# Patient Record
Sex: Male | Born: 1935 | Race: Black or African American | Hispanic: No | Smoking: Former smoker
Health system: Southern US, Community
[De-identification: ages and names within clinical notes are randomized; demographics above are authoritative.]

## PROBLEM LIST (undated history)

## (undated) DIAGNOSIS — R011 Cardiac murmur, unspecified: Secondary | ICD-10-CM

## (undated) DIAGNOSIS — I10 Essential (primary) hypertension: Secondary | ICD-10-CM

## (undated) DIAGNOSIS — I2510 Atherosclerotic heart disease of native coronary artery without angina pectoris: Secondary | ICD-10-CM

## (undated) DIAGNOSIS — K224 Dyskinesia of esophagus: Secondary | ICD-10-CM

## (undated) DIAGNOSIS — J383 Other diseases of vocal cords: Secondary | ICD-10-CM

## (undated) DIAGNOSIS — K2210 Ulcer of esophagus without bleeding: Secondary | ICD-10-CM

## (undated) DIAGNOSIS — E7800 Pure hypercholesterolemia, unspecified: Secondary | ICD-10-CM

## (undated) DIAGNOSIS — Z8719 Personal history of other diseases of the digestive system: Secondary | ICD-10-CM

## (undated) DIAGNOSIS — I639 Cerebral infarction, unspecified: Secondary | ICD-10-CM

## (undated) DIAGNOSIS — Z8711 Personal history of peptic ulcer disease: Secondary | ICD-10-CM

## (undated) DIAGNOSIS — E119 Type 2 diabetes mellitus without complications: Secondary | ICD-10-CM

## (undated) DIAGNOSIS — K219 Gastro-esophageal reflux disease without esophagitis: Secondary | ICD-10-CM

## (undated) HISTORY — PX: BACK SURGERY: SHX140

## (undated) HISTORY — PX: CATARACT EXTRACTION W/ INTRAOCULAR LENS IMPLANT: SHX1309

## (undated) HISTORY — PX: LUMBAR DISC SURGERY: SHX700

## (undated) HISTORY — PX: LARYNX SURGERY: SHX692

## (undated) HISTORY — PX: LAPAROSCOPIC CHOLECYSTECTOMY: SUR755

## (undated) HISTORY — PX: APPENDECTOMY: SHX54

---

## 1989-04-27 DIAGNOSIS — I639 Cerebral infarction, unspecified: Secondary | ICD-10-CM

## 1989-04-27 HISTORY — DX: Cerebral infarction, unspecified: I63.9

## 2009-10-27 ENCOUNTER — Ambulatory Visit: Payer: Self-pay | Admitting: Cardiology

## 2009-10-27 ENCOUNTER — Encounter (INDEPENDENT_AMBULATORY_CARE_PROVIDER_SITE_OTHER): Payer: Self-pay | Admitting: Internal Medicine

## 2009-11-01 ENCOUNTER — Emergency Department (HOSPITAL_BASED_OUTPATIENT_CLINIC_OR_DEPARTMENT_OTHER): Admission: EM | Admit: 2009-11-01 | Discharge: 2009-11-01 | Payer: Self-pay | Admitting: Emergency Medicine

## 2010-04-03 ENCOUNTER — Inpatient Hospital Stay (HOSPITAL_COMMUNITY): Admission: EM | Admit: 2010-04-03 | Discharge: 2009-10-29 | Payer: Self-pay | Admitting: Emergency Medicine

## 2010-07-13 LAB — BASIC METABOLIC PANEL
BUN: 16 mg/dL (ref 6–23)
CO2: 34 mEq/L — ABNORMAL HIGH (ref 19–32)
Calcium: 9.2 mg/dL (ref 8.4–10.5)
Creatinine, Ser: 1.23 mg/dL (ref 0.4–1.5)
Potassium: 3.6 mEq/L (ref 3.5–5.1)
Sodium: 139 mEq/L (ref 135–145)

## 2010-07-13 LAB — CK TOTAL AND CKMB (NOT AT ARMC): Relative Index: 1.6 (ref 0.0–2.5)

## 2010-07-13 LAB — URINALYSIS, ROUTINE W REFLEX MICROSCOPIC
Ketones, ur: NEGATIVE mg/dL
Leukocytes, UA: NEGATIVE
Protein, ur: NEGATIVE mg/dL
Specific Gravity, Urine: 1.006 (ref 1.005–1.030)
Urobilinogen, UA: 0.2 mg/dL (ref 0.0–1.0)

## 2010-07-13 LAB — CARDIAC PANEL(CRET KIN+CKTOT+MB+TROPI)
CK, MB: 3.3 ng/mL (ref 0.3–4.0)
Relative Index: 1 (ref 0.0–2.5)
Total CK: 273 U/L — ABNORMAL HIGH (ref 7–232)
Troponin I: 0.02 ng/mL (ref 0.00–0.06)

## 2010-07-13 LAB — URINE MICROSCOPIC-ADD ON

## 2010-07-13 LAB — GLUCOSE, CAPILLARY
Glucose-Capillary: 153 mg/dL — ABNORMAL HIGH (ref 70–99)
Glucose-Capillary: 157 mg/dL — ABNORMAL HIGH (ref 70–99)
Glucose-Capillary: 171 mg/dL — ABNORMAL HIGH (ref 70–99)
Glucose-Capillary: 186 mg/dL — ABNORMAL HIGH (ref 70–99)
Glucose-Capillary: 205 mg/dL — ABNORMAL HIGH (ref 70–99)
Glucose-Capillary: 207 mg/dL — ABNORMAL HIGH (ref 70–99)

## 2010-07-13 LAB — POCT CARDIAC MARKERS: Myoglobin, poc: 128 ng/mL (ref 12–200)

## 2010-07-13 LAB — POCT I-STAT, CHEM 8
BUN: 18 mg/dL (ref 6–23)
Glucose, Bld: 112 mg/dL — ABNORMAL HIGH (ref 70–99)
HCT: 48 % (ref 39.0–52.0)
Potassium: 3.9 mEq/L (ref 3.5–5.1)

## 2010-07-13 LAB — TSH: TSH: 1.225 u[IU]/mL (ref 0.350–4.500)

## 2010-07-13 LAB — MAGNESIUM: Magnesium: 2.2 mg/dL (ref 1.5–2.5)

## 2010-07-13 LAB — LIPID PANEL: LDL Cholesterol: 59 mg/dL (ref 0–99)

## 2010-07-13 LAB — TROPONIN I: Troponin I: 0.02 ng/mL (ref 0.00–0.06)

## 2010-12-03 ENCOUNTER — Other Ambulatory Visit (HOSPITAL_COMMUNITY): Payer: Self-pay | Admitting: Gastroenterology

## 2010-12-03 DIAGNOSIS — R142 Eructation: Secondary | ICD-10-CM

## 2010-12-03 DIAGNOSIS — K30 Functional dyspepsia: Secondary | ICD-10-CM

## 2010-12-05 ENCOUNTER — Ambulatory Visit (HOSPITAL_COMMUNITY)
Admission: RE | Admit: 2010-12-05 | Discharge: 2010-12-05 | Disposition: A | Discharge status: Home / Self Care | Payer: Medicare Other | Source: Ambulatory Visit | Attending: Gastroenterology | Admitting: Gastroenterology

## 2010-12-05 DIAGNOSIS — R6889 Other general symptoms and signs: Secondary | ICD-10-CM | POA: Insufficient documentation

## 2010-12-05 DIAGNOSIS — K3189 Other diseases of stomach and duodenum: Secondary | ICD-10-CM | POA: Insufficient documentation

## 2010-12-05 DIAGNOSIS — K449 Diaphragmatic hernia without obstruction or gangrene: Secondary | ICD-10-CM | POA: Insufficient documentation

## 2010-12-05 DIAGNOSIS — K30 Functional dyspepsia: Secondary | ICD-10-CM

## 2010-12-05 DIAGNOSIS — K219 Gastro-esophageal reflux disease without esophagitis: Secondary | ICD-10-CM | POA: Insufficient documentation

## 2010-12-05 DIAGNOSIS — Z9089 Acquired absence of other organs: Secondary | ICD-10-CM | POA: Insufficient documentation

## 2010-12-05 DIAGNOSIS — K224 Dyskinesia of esophagus: Secondary | ICD-10-CM | POA: Insufficient documentation

## 2010-12-05 DIAGNOSIS — R1110 Vomiting, unspecified: Secondary | ICD-10-CM | POA: Insufficient documentation

## 2010-12-05 DIAGNOSIS — R142 Eructation: Secondary | ICD-10-CM

## 2010-12-19 ENCOUNTER — Emergency Department (HOSPITAL_COMMUNITY)
Admission: EM | Admit: 2010-12-19 | Discharge: 2010-12-19 | Disposition: A | Discharge status: Home / Self Care | Payer: Medicare Other | Attending: Emergency Medicine | Admitting: Emergency Medicine

## 2010-12-19 ENCOUNTER — Encounter (HOSPITAL_COMMUNITY)
Admission: RE | Admit: 2010-12-19 | Discharge: 2010-12-19 | Disposition: A | Discharge status: Home / Self Care | Payer: Medicare Other | Source: Ambulatory Visit | Attending: Gastroenterology | Admitting: Gastroenterology

## 2010-12-19 ENCOUNTER — Emergency Department (HOSPITAL_COMMUNITY): Payer: Medicare Other

## 2010-12-19 DIAGNOSIS — K920 Hematemesis: Secondary | ICD-10-CM | POA: Insufficient documentation

## 2010-12-19 DIAGNOSIS — I10 Essential (primary) hypertension: Secondary | ICD-10-CM | POA: Insufficient documentation

## 2010-12-19 DIAGNOSIS — R142 Eructation: Secondary | ICD-10-CM

## 2010-12-19 DIAGNOSIS — R109 Unspecified abdominal pain: Secondary | ICD-10-CM | POA: Insufficient documentation

## 2010-12-19 DIAGNOSIS — Z794 Long term (current) use of insulin: Secondary | ICD-10-CM | POA: Insufficient documentation

## 2010-12-19 DIAGNOSIS — Z9089 Acquired absence of other organs: Secondary | ICD-10-CM | POA: Insufficient documentation

## 2010-12-19 DIAGNOSIS — E119 Type 2 diabetes mellitus without complications: Secondary | ICD-10-CM | POA: Insufficient documentation

## 2010-12-19 DIAGNOSIS — K30 Functional dyspepsia: Secondary | ICD-10-CM

## 2010-12-19 LAB — CBC
HCT: 43.5 % (ref 39.0–52.0)
Hemoglobin: 13.8 g/dL (ref 13.0–17.0)
MCV: 85.5 fL (ref 78.0–100.0)
RDW: 13.6 % (ref 11.5–15.5)
WBC: 6.3 10*3/uL (ref 4.0–10.5)

## 2010-12-19 LAB — DIFFERENTIAL
Basophils Absolute: 0 10*3/uL (ref 0.0–0.1)
Eosinophils Relative: 7 % — ABNORMAL HIGH (ref 0–5)
Lymphocytes Relative: 30 % (ref 12–46)
Lymphs Abs: 1.9 10*3/uL (ref 0.7–4.0)
Neutro Abs: 3.6 10*3/uL (ref 1.7–7.7)

## 2010-12-19 LAB — COMPREHENSIVE METABOLIC PANEL WITH GFR
ALT: 25 U/L (ref 0–53)
AST: 22 U/L (ref 0–37)
Albumin: 3.5 g/dL (ref 3.5–5.2)
Alkaline Phosphatase: 55 U/L (ref 39–117)
BUN: 16 mg/dL (ref 6–23)
CO2: 35 meq/L — ABNORMAL HIGH (ref 19–32)
Calcium: 9.8 mg/dL (ref 8.4–10.5)
Chloride: 95 meq/L — ABNORMAL LOW (ref 96–112)
Creatinine, Ser: 1.19 mg/dL (ref 0.50–1.35)
GFR calc Af Amer: >60 mL/min (ref >60)
GFR calc non Af Amer: 59 mL/min — ABNORMAL LOW (ref >60)
Glucose, Bld: 140 mg/dL — ABNORMAL HIGH (ref 70–99)
Potassium: 3.2 meq/L — ABNORMAL LOW (ref 3.5–5.1)
Sodium: 137 meq/L (ref 135–145)
Total Bilirubin: 0.4 mg/dL (ref 0.3–1.2)
Total Protein: 7.3 g/dL (ref 6.0–8.3)

## 2010-12-19 LAB — GASTRIC OCCULT BLOOD (1-CARD TO LAB): Occult Blood, Gastric: POSITIVE — AB

## 2010-12-19 LAB — LIPASE, BLOOD: Lipase: 23 U/L (ref 11–59)

## 2010-12-30 ENCOUNTER — Other Ambulatory Visit (HOSPITAL_COMMUNITY): Payer: Self-pay | Admitting: Gastroenterology

## 2010-12-30 DIAGNOSIS — T17998A Other foreign object in respiratory tract, part unspecified causing other injury, initial encounter: Secondary | ICD-10-CM

## 2011-01-02 ENCOUNTER — Other Ambulatory Visit (HOSPITAL_COMMUNITY): Payer: Self-pay | Admitting: Gastroenterology

## 2011-01-02 DIAGNOSIS — R142 Eructation: Secondary | ICD-10-CM

## 2011-01-06 ENCOUNTER — Encounter (HOSPITAL_COMMUNITY)
Admission: RE | Admit: 2011-01-06 | Discharge: 2011-01-06 | Disposition: A | Discharge status: Home / Self Care | Payer: Medicare Other | Source: Ambulatory Visit | Attending: Gastroenterology | Admitting: Gastroenterology

## 2011-01-06 DIAGNOSIS — R142 Eructation: Secondary | ICD-10-CM

## 2011-01-06 DIAGNOSIS — K3189 Other diseases of stomach and duodenum: Secondary | ICD-10-CM | POA: Insufficient documentation

## 2011-01-06 MED ORDER — TECHNETIUM TC 99M SULFUR COLLOID
2.0000 | Freq: Once | INTRAVENOUS | Status: AC | PRN
  Administered 2011-01-06: 2 via INTRAVENOUS

## 2011-01-09 ENCOUNTER — Ambulatory Visit (HOSPITAL_COMMUNITY)
Admission: RE | Admit: 2011-01-09 | Discharge: 2011-01-09 | Disposition: A | Discharge status: Home / Self Care | Payer: Medicare Other | Source: Ambulatory Visit | Attending: Gastroenterology | Admitting: Gastroenterology

## 2011-01-09 DIAGNOSIS — R1310 Dysphagia, unspecified: Secondary | ICD-10-CM | POA: Insufficient documentation

## 2011-01-09 DIAGNOSIS — T17998A Other foreign object in respiratory tract, part unspecified causing other injury, initial encounter: Secondary | ICD-10-CM

## 2011-08-25 ENCOUNTER — Inpatient Hospital Stay (HOSPITAL_BASED_OUTPATIENT_CLINIC_OR_DEPARTMENT_OTHER)
Admission: EM | Admit: 2011-08-25 | Discharge: 2011-08-27 | DRG: 379 | Disposition: A | Discharge status: Home / Self Care | Payer: Medicare Other | Attending: Internal Medicine | Admitting: Internal Medicine

## 2011-08-25 ENCOUNTER — Encounter (HOSPITAL_BASED_OUTPATIENT_CLINIC_OR_DEPARTMENT_OTHER): Payer: Self-pay | Admitting: *Deleted

## 2011-08-25 DIAGNOSIS — Z8673 Personal history of transient ischemic attack (TIA), and cerebral infarction without residual deficits: Secondary | ICD-10-CM

## 2011-08-25 DIAGNOSIS — I10 Essential (primary) hypertension: Secondary | ICD-10-CM | POA: Diagnosis present

## 2011-08-25 DIAGNOSIS — I639 Cerebral infarction, unspecified: Secondary | ICD-10-CM | POA: Diagnosis present

## 2011-08-25 DIAGNOSIS — K209 Esophagitis, unspecified without bleeding: Secondary | ICD-10-CM | POA: Diagnosis present

## 2011-08-25 DIAGNOSIS — E119 Type 2 diabetes mellitus without complications: Secondary | ICD-10-CM | POA: Diagnosis present

## 2011-08-25 DIAGNOSIS — I2510 Atherosclerotic heart disease of native coronary artery without angina pectoris: Secondary | ICD-10-CM | POA: Diagnosis present

## 2011-08-25 DIAGNOSIS — K219 Gastro-esophageal reflux disease without esophagitis: Secondary | ICD-10-CM | POA: Diagnosis present

## 2011-08-25 DIAGNOSIS — K920 Hematemesis: Secondary | ICD-10-CM

## 2011-08-25 DIAGNOSIS — Z8719 Personal history of other diseases of the digestive system: Secondary | ICD-10-CM | POA: Diagnosis present

## 2011-08-25 DIAGNOSIS — K922 Gastrointestinal hemorrhage, unspecified: Principal | ICD-10-CM | POA: Diagnosis present

## 2011-08-25 DIAGNOSIS — Z794 Long term (current) use of insulin: Secondary | ICD-10-CM

## 2011-08-25 DIAGNOSIS — Z8711 Personal history of peptic ulcer disease: Secondary | ICD-10-CM | POA: Diagnosis present

## 2011-08-25 HISTORY — DX: Cerebral infarction, unspecified: I63.9

## 2011-08-25 HISTORY — DX: Essential (primary) hypertension: I10

## 2011-08-25 HISTORY — DX: Personal history of peptic ulcer disease: Z87.11

## 2011-08-25 HISTORY — DX: Atherosclerotic heart disease of native coronary artery without angina pectoris: I25.10

## 2011-08-25 HISTORY — DX: Personal history of other diseases of the digestive system: Z87.19

## 2011-08-25 HISTORY — DX: Gastro-esophageal reflux disease without esophagitis: K21.9

## 2011-08-25 LAB — COMPREHENSIVE METABOLIC PANEL
ALT: 23 U/L (ref 0–53)
Albumin: 3.4 g/dL — ABNORMAL LOW (ref 3.5–5.2)
Calcium: 9.2 mg/dL (ref 8.4–10.5)
GFR calc Af Amer: 71 mL/min — ABNORMAL LOW (ref >90)
Glucose, Bld: 147 mg/dL — ABNORMAL HIGH (ref 70–99)
Potassium: 3.3 mEq/L — ABNORMAL LOW (ref 3.5–5.1)
Sodium: 134 mEq/L — ABNORMAL LOW (ref 135–145)
Total Protein: 6.3 g/dL (ref 6.0–8.3)

## 2011-08-25 LAB — CBC
MCH: 27.9 pg (ref 26.0–34.0)
MCHC: 33.5 g/dL (ref 30.0–36.0)
MCV: 83.3 fL (ref 78.0–100.0)
MCV: 85.1 fL (ref 78.0–100.0)
Platelets: 196 10*3/uL (ref 150–400)
Platelets: 188 10*3/uL (ref 150–400)
RBC: 4.44 MIL/uL (ref 4.22–5.81)
RDW: 13.6 % (ref 11.5–15.5)
RDW: 13.8 % (ref 11.5–15.5)
WBC: 9.3 10*3/uL (ref 4.0–10.5)

## 2011-08-25 LAB — DIFFERENTIAL
Basophils Relative: 0 % (ref 0–1)
Eosinophils Absolute: 0.3 10*3/uL (ref 0.0–0.7)
Eosinophils Relative: 4 % (ref 0–5)
Lymphs Abs: 1.5 10*3/uL (ref 0.7–4.0)
Neutrophils Relative %: 64 % (ref 43–77)

## 2011-08-25 LAB — MAGNESIUM: Magnesium: 1.7 mg/dL (ref 1.5–2.5)

## 2011-08-25 LAB — CARDIAC PANEL(CRET KIN+CKTOT+MB+TROPI)
Relative Index: 0.7 (ref 0.0–2.5)
Total CK: 464 U/L — ABNORMAL HIGH (ref 7–232)
Total CK: 417 U/L — ABNORMAL HIGH (ref 7–232)
Troponin I: <0.3 ng/mL (ref <0.30)

## 2011-08-25 MED ORDER — ACETAMINOPHEN 325 MG PO TABS: 650.0000 mg | ORAL_TABLET | Freq: Four times a day (QID) | ORAL | Status: DC | PRN

## 2011-08-25 MED ORDER — SODIUM CHLORIDE 0.9 % IV SOLN
Freq: Once | INTRAVENOUS | Status: AC
  Administered 2011-08-25: 09:00:00 via INTRAVENOUS

## 2011-08-25 MED ORDER — INSULIN GLARGINE 100 UNIT/ML ~~LOC~~ SOLN
5.0000 [IU] | Freq: Every day | SUBCUTANEOUS | Status: DC
  Administered 2011-08-25 – 2011-08-26 (×2): 5 [IU] via SUBCUTANEOUS

## 2011-08-25 MED ORDER — SUCRALFATE 1 G PO TABS
1.0000 g | ORAL_TABLET | Freq: Three times a day (TID) | ORAL | Status: DC
  Administered 2011-08-25 – 2011-08-27 (×8): 1 g via ORAL
  Filled 2011-08-25 (×12): qty 1

## 2011-08-25 MED ORDER — SODIUM CHLORIDE 0.9 % IV SOLN
8.0000 mg/h | INTRAVENOUS | Status: DC
  Administered 2011-08-25 – 2011-08-27 (×4): 8 mg/h via INTRAVENOUS
  Filled 2011-08-25 (×8): qty 80

## 2011-08-25 MED ORDER — ONDANSETRON HCL 4 MG/2ML IJ SOLN: 4.0000 mg | Freq: Four times a day (QID) | INTRAMUSCULAR | Status: DC | PRN

## 2011-08-25 MED ORDER — SODIUM CHLORIDE 0.9 % IV SOLN
Freq: Once | INTRAVENOUS | Status: AC
  Administered 2011-08-25: 12:00:00 via INTRAVENOUS

## 2011-08-25 MED ORDER — PANTOPRAZOLE SODIUM 40 MG IV SOLR
40.0000 mg | Freq: Once | INTRAVENOUS | Status: AC
  Administered 2011-08-25: 40 mg via INTRAVENOUS
  Filled 2011-08-25: qty 40

## 2011-08-25 MED ORDER — INSULIN ASPART 100 UNIT/ML ~~LOC~~ SOLN: 0.0000 [IU] | Freq: Every day | SUBCUTANEOUS | Status: DC

## 2011-08-25 MED ORDER — CARVEDILOL 25 MG PO TABS
25.0000 mg | ORAL_TABLET | Freq: Two times a day (BID) | ORAL | Status: DC
  Administered 2011-08-25 – 2011-08-27 (×5): 25 mg via ORAL
  Filled 2011-08-25 (×6): qty 1

## 2011-08-25 MED ORDER — ONDANSETRON HCL 4 MG PO TABS: 4.0000 mg | ORAL_TABLET | Freq: Four times a day (QID) | ORAL | Status: DC | PRN

## 2011-08-25 MED ORDER — SODIUM CHLORIDE 0.9 % IJ SOLN
3.0000 mL | Freq: Two times a day (BID) | INTRAMUSCULAR | Status: DC
  Administered 2011-08-25: 3 mL via INTRAVENOUS

## 2011-08-25 MED ORDER — ACETAMINOPHEN 650 MG RE SUPP: 650.0000 mg | Freq: Four times a day (QID) | RECTAL | Status: DC | PRN

## 2011-08-25 MED ORDER — ATORVASTATIN CALCIUM 40 MG PO TABS
40.0000 mg | ORAL_TABLET | Freq: Every day | ORAL | Status: DC
  Administered 2011-08-25 – 2011-08-27 (×3): 40 mg via ORAL
  Filled 2011-08-25 (×3): qty 1

## 2011-08-25 MED ORDER — GI COCKTAIL ~~LOC~~
ORAL | Status: AC
  Administered 2011-08-25: 30 mL
  Filled 2011-08-25: qty 30

## 2011-08-25 MED ORDER — INSULIN ASPART 100 UNIT/ML ~~LOC~~ SOLN
0.0000 [IU] | Freq: Three times a day (TID) | SUBCUTANEOUS | Status: DC
  Administered 2011-08-26: 2 [IU] via SUBCUTANEOUS
  Administered 2011-08-27 (×2): 1 [IU] via SUBCUTANEOUS
  Administered 2011-08-27: 2 [IU] via SUBCUTANEOUS

## 2011-08-25 MED ORDER — ONDANSETRON HCL 4 MG/2ML IJ SOLN
4.0000 mg | Freq: Once | INTRAMUSCULAR | Status: AC
  Administered 2011-08-25: 4 mg via INTRAVENOUS
  Filled 2011-08-25: qty 2

## 2011-08-25 MED ORDER — ALUM & MAG HYDROXIDE-SIMETH 200-200-20 MG/5ML PO SUSP: 30.0000 mL | Freq: Four times a day (QID) | ORAL | Status: DC | PRN

## 2011-08-25 MED ORDER — AMLODIPINE BESYLATE 10 MG PO TABS
10.0000 mg | ORAL_TABLET | Freq: Every day | ORAL | Status: DC
  Administered 2011-08-25 – 2011-08-27 (×2): 10 mg via ORAL
  Filled 2011-08-25 (×3): qty 1

## 2011-08-25 MED ORDER — LATANOPROST 0.005 % OP SOLN
1.0000 [drp] | Freq: Every day | OPHTHALMIC | Status: DC
  Administered 2011-08-25 – 2011-08-26 (×2): 1 [drp] via OPHTHALMIC
  Filled 2011-08-25 (×2): qty 2.5

## 2011-08-25 MED ORDER — POTASSIUM CHLORIDE CRYS ER 20 MEQ PO TBCR
40.0000 meq | EXTENDED_RELEASE_TABLET | Freq: Once | ORAL | Status: AC
  Administered 2011-08-25: 40 meq via ORAL
  Filled 2011-08-25: qty 2

## 2011-08-25 NOTE — ED Notes (Signed)
Family at bedside and informed of plan of care. 

## 2011-08-25 NOTE — ED Notes (Signed)
Urinal provided.

## 2011-08-25 NOTE — ED Notes (Signed)
Patient and son states patient has had upper abdominal pain with nausea and burning in his esophagus for the last 2 1/2 days.  History of stomach ulcers and uses carafate, which he has not used for several months.  Now feels weak and has decreased appetite.

## 2011-08-25 NOTE — Consult Note (Signed)
Lab Results  Component Value Date   HGB 12.4* 08/25/2011   HGB 13.8 12/19/2010   HGB 14.2 10/28/2009   HCT 37.0* 08/25/2011   HCT 43.5 12/19/2010   HCT 44.6 10/28/2009   ALKPHOS 46 08/25/2011   ALKPHOS 55 12/19/2010   AST 26 08/25/2011   AST 22 12/19/2010   ALT 23 08/25/2011   ALT 25 12/19/2010     Subjective:   HPI  The patient is an 78 year old male who presented to the emergency room with complaints of hematemesis. He described this as coffee-ground appearing material. His hemoglobin was 12.4. He denied vomiting bright red blood. He has a history of a large gastric polyp near the cardia of his stomach which was evaluated at [facility] several years ago and he was told to leave this alone do to its benign nature and also its location. He has been on Zegerid for quite some time and was also on Carafate but stopped taking that in January. He has been experiencing intermittent vomiting and recently some coffee-ground. Because of the coffee-ground emesis he was referred to this hospital for admission.  Review of Systems Denies chest pain or shortness of breath  Past Medical History  Diagnosis Date  . Stroke   . Coronary artery disease   . Hypertension   . Diabetes mellitus   . GERD (gastroesophageal reflux disease)   . History of stomach ulcers    Past Surgical History  Procedure Date  . Laparoscopic cholecystectomy    History   Social History  . Marital Status: Widowed    Spouse Name: N/A    Number of Children: N/A  . Years of Education: N/A   Occupational History  . Not on file.   Social History Main Topics  . Smoking status: Never Smoker   . Smokeless tobacco: Not on file  . Alcohol Use: No  . Drug Use: No  . Sexually Active:    Other Topics Concern  . Not on file   Social History Narrative   Lives with daughter   family history is not on file. Current facility-administered medications:0.9 %  sodium chloride infusion, , Intravenous, Once, Geoffery Lyons, MD, Last  Rate: 1,000 mL/hr at 08/25/11 0836;  0.9 %  sodium chloride infusion, , Intravenous, Once, Geoffery Lyons, MD, Last Rate: 20 mL/hr at 08/25/11 1154;  acetaminophen (TYLENOL) suppository 650 mg, 650 mg, Rectal, Q6H PRN, Sorin Luanne Bras, MD;  acetaminophen (TYLENOL) tablet 650 mg, 650 mg, Oral, Q6H PRN, Sorin Luanne Bras, MD alum & mag hydroxide-simeth (MAALOX/MYLANTA) 200-200-20 MG/5ML suspension 30 mL, 30 mL, Oral, Q6H PRN, Sorin Luanne Bras, MD;  amLODipine (NORVASC) tablet 10 mg, 10 mg, Oral, Daily, Sorin C Laza, MD, 10 mg at 08/25/11 1446;  atorvastatin (LIPITOR) tablet 40 mg, 40 mg, Oral, q1800, Sorin C Lavera Guise, MD;  carvedilol (COREG) tablet 25 mg, 25 mg, Oral, BID WC, Sorin C Laza, MD;  gi cocktail suspension, , , , , 30 mL at 08/25/11 0917 insulin aspart (novoLOG) injection 0-5 Units, 0-5 Units, Subcutaneous, QHS, Sorin C Laza, MD;  insulin aspart (novoLOG) injection 0-9 Units, 0-9 Units, Subcutaneous, TID WC, Sorin C Laza, MD;  insulin glargine (LANTUS) injection 5 Units, 5 Units, Subcutaneous, QHS, Sorin C Laza, MD;  latanoprost (XALATAN) 0.005 % ophthalmic solution 1 drop, 1 drop, Both Eyes, QHS, Sorin C Laza, MD ondansetron (ZOFRAN) injection 4 mg, 4 mg, Intravenous, Once, Geoffery Lyons, MD, 4 mg at 08/25/11 0837;  ondansetron (ZOFRAN) injection 4 mg, 4 mg, Intravenous,  Q6H PRN, Sorin Luanne Bras, MD;  ondansetron [facility]) tablet 4 mg, 4 mg, Oral, Q6H PRN, Sorin C Lavera Guise, MD;  pantoprazole (PROTONIX) 80 mg in sodium chloride 0.9 % 250 mL infusion, 8 mg/hr, Intravenous, Continuous, Geoffery Lyons, MD, Last Rate: 25 mL/hr at 08/25/11 1154, 8 mg/hr at 08/25/11 1154 pantoprazole (PROTONIX) injection 40 mg, 40 mg, Intravenous, Once, Geoffery Lyons, MD, 40 mg at 08/25/11 0837;  sodium chloride 0.9 % injection 3 mL, 3 mL, Intravenous, Q12H, Sorin C Laza, MD;  sucralfate (CARAFATE) tablet 1 g, 1 g, Oral, TID AC & HS, Sorin C Laza, MD No Known Allergies   Objective:     BP 137/60  Pulse 69  Temp(Src) 99 F (37.2 C) (Oral)  Resp 18   Ht 5\' 11"  (1.803 m)  Wt 81.3 kg (179 lb 3.7 oz)  BMI 25.00 kg/m2  SpO2 96%  Alert and oriented and in no acute distress. Currently eating some clear liquids.  Heart regular rhythm no murmurs  Lungs clear  Abdomen: Bowel sounds normal, soft, nontender, no obvious hepatosplenomegaly  Laboratory No components found with this basename: d1      Assessment:     Coffee-ground emesis      Plan:     The patient is admitted to the hospital by the hospitalist service. We will plan to proceed with EGD tomorrow to evaluate the upper GI tract. Recommend PPI therapy.

## 2011-08-25 NOTE — Progress Notes (Signed)
Utilization Review Completed.Jose Kennedy T4/30/2013   

## 2011-08-25 NOTE — ED Provider Notes (Signed)
History     CSN: [id number]  Arrival date & time 08/25/11  4098   First MD Initiated Contact with Patient 08/25/11 [phone]      Chief Complaint  Patient presents with  . Abdominal Pain    (Consider location/radiation/quality/duration/timing/severity/associated sxs/prior treatment) HPI Comments: Patient with history of gastric ulcers/ reflux/ polyp.  Presents today with retching, spitting up coffee ground like material.    Patient is a 78 y.o. male presenting with abdominal pain. The history is provided by the patient and a relative.  Abdominal Pain The primary symptoms of the illness include abdominal pain and nausea. The primary symptoms of the illness do not include fever or diarrhea. Episode onset: 2-3 days ago. The onset of the illness was gradual. The problem has been gradually worsening.  The illness is associated with retching. The patient has not had a change in bowel habit. Additional symptoms associated with the illness include heartburn. Symptoms associated with the illness do not include chills. Significant associated medical issues include PUD, GERD and diabetes.    Past Medical History  Diagnosis Date  . Stroke   . Coronary artery disease   . Hypertension   . Diabetes mellitus   . GERD (gastroesophageal reflux disease)   . History of stomach ulcers     No past surgical history on file.  No family history on file.  History  Substance Use Topics  . Smoking status: Never Smoker   . Smokeless tobacco: Not on file  . Alcohol Use: No      Review of Systems  Constitutional: Negative for fever and chills.  Gastrointestinal: Positive for heartburn, nausea and abdominal pain. Negative for diarrhea.  All other systems reviewed and are negative.    Allergies  Review of patient's allergies indicates no known allergies.  Home Medications   Current Outpatient Rx  Name Route Sig Dispense Refill  . AMLODIPINE BESYLATE 10 MG PO TABS Oral Take 10 mg by mouth daily.     . ASPIRIN 81 MG PO TABS Oral Take 81 mg by mouth daily.    Marland Kitchen CARVEDILOL 25 MG PO TABS Oral Take 25 mg by mouth 2 (two) times daily with a meal.    . DOCUSATE SODIUM 100 MG PO CAPS Oral Take 100 mg by mouth 2 (two) times daily.    Marland Kitchen FERROUS GLUCONATE 325 MG PO TABS Oral Take 325 mg by mouth daily with breakfast.    . HYDRALAZINE HCL 10 MG PO TABS Oral Take 10 mg by mouth 3 (three) times daily.    Marland Kitchen HYDROCHLOROTHIAZIDE 25 MG PO TABS Oral Take 25 mg by mouth daily.    Marland Kitchen LOSARTAN POTASSIUM 100 MG PO TABS Oral Take 100 mg by mouth daily.    Marland Kitchen SIMVASTATIN 80 MG PO TABS Oral Take 80 mg by mouth at bedtime.    . SUCRALFATE 1 G PO TABS Oral Take 1 g by mouth 4 (four) times daily.      BP 141/88  Pulse 73  Temp(Src) 99.2 F (37.3 C) (Oral)  Resp 22  Ht 5\' 11"  (1.803 m)  Wt 175 lb (79.379 kg)  BMI 24.41 kg/m2  SpO2 100%  Physical Exam  Nursing note and vitals reviewed. Constitutional: He is oriented to person, place, and time. He appears well-developed and well-nourished. No distress.  HENT:  Head: Normocephalic and atraumatic.  Neck: Normal range of motion. Neck supple.  Cardiovascular: Normal rate and regular rhythm.   Pulmonary/Chest: Effort normal and breath sounds normal.  Abdominal: Soft. Bowel sounds are normal.  Musculoskeletal: Normal range of motion. He exhibits no edema.  Neurological: He is alert and oriented to person, place, and time.  Skin: Skin is warm and dry. He is not diaphoretic.    ED Course  Procedures (including critical care time)   Labs Reviewed  CBC  DIFFERENTIAL  COMPREHENSIVE METABOLIC PANEL  TYPE AND SCREEN   No results found.   No diagnosis found.    MDM  The patient is having coffee-ground emesis and has a history of what sounds like chronic gastritis.  I have spoken with Dr. Evette Cristal from GI who wants patient admitted to [facility]  I have spoken with Dr. Thedore Mins who agrees to accept the patient in transfer.        Geoffery Lyons, MD 08/25/11  1101

## 2011-08-25 NOTE — Progress Notes (Signed)
Patient is without any emesis this shift. Madelin Rear RN, CMSRN

## 2011-08-25 NOTE — ED Notes (Signed)
EMTALA Consent obtained from family.  [facility] at bedside and preparing for transport.

## 2011-08-25 NOTE — ED Notes (Signed)
MD at bedside. 

## 2011-08-25 NOTE — H&P (Signed)
PCP:   Dr. Nelson Chimes in [place]    Chief Complaint:  Vomited coffee ground material   HPI: 78 yo man with extensive hx of GERD, hiccups, upper GI bleed, gastric polyp, with EGDs at [facility], [facility] in the past , admitted now for recurrent coffee ground emesis.  Review of Systems:  The patient denies anorexia, fever, weight loss,, vision loss, decreased hearing, hoarseness, chest pain, syncope, dyspnea on exertion, peripheral edema, balance deficits, hemoptysis, abdominal pain, melena, hematochezia,  hematuria, incontinence, genital sores, muscle weakness, suspicious skin lesions, transient blindness, difficulty walking, depression, unusual weight change,  Has very mild chronic left side weakness from prior strokes  Past Medical History: Past Medical History  Diagnosis Date  . Stroke   . Coronary artery disease   . Hypertension   . GERD (gastroesophageal reflux disease)   . History of stomach ulcers   . Diabetes mellitus     insulin dependent   Past Surgical History  Procedure Date  . Laparoscopic cholecystectomy   . Back surgery   . Thyroid nodual   . Appendectomy     Medications: Prior to Admission medications   Medication Sig Start Date End Date Taking? Authorizing Provider  amLODipine (NORVASC) 10 MG tablet Take 10 mg by mouth daily.   Yes Historical Provider, MD  aspirin 81 MG tablet Take 81 mg by mouth daily.   Yes Historical Provider, MD  carvedilol (COREG) 25 MG tablet Take 25 mg by mouth 2 (two) times daily with a meal.   Yes Historical Provider, MD  carvedilol (COREG) 25 MG tablet Take 25 mg by mouth 2 (two) times daily with a meal.   Yes Historical Provider, MD  docusate sodium (COLACE) 100 MG capsule Take 100 mg by mouth 2 (two) times daily.   Yes Historical Provider, MD  ferrous sulfate 325 (65 FE) MG tablet Take 325 mg by mouth 2 (two) times daily.   Yes Historical Provider, MD  hydrALAZINE (APRESOLINE) 10 MG tablet Take 10 mg by mouth 3 (three) times daily.    Yes Historical Provider, MD  hydrochlorothiazide (HYDRODIURIL) 25 MG tablet Take 25 mg by mouth daily.   Yes Historical Provider, MD  insulin NPH-insulin regular (NOVOLIN 70/30) (70-30) 100 UNIT/ML injection Inject 12 Units into the skin 2 (two) times daily with a meal.   Yes Historical Provider, MD  latanoprost (XALATAN) 0.005 % ophthalmic solution Place 1 drop into both eyes at bedtime.   Yes Historical Provider, MD  losartan (COZAAR) 100 MG tablet Take 100 mg by mouth daily.   Yes Historical Provider, MD  Multiple Vitamins-Minerals (CENTRUM SILVER PO) Take 1 tablet by mouth daily.   Yes Historical Provider, MD  simvastatin (ZOCOR) 80 MG tablet Take 80 mg by mouth at bedtime.   Yes Historical Provider, MD  sucralfate (CARAFATE) 1 G tablet Take 1 g by mouth 4 (four) times daily.   Yes Historical Provider, MD    Allergies:  No Known Allergies  Social History:  reports that he has quit smoking. He has never used smokeless tobacco. He reports that he does not drink alcohol or use illicit drugs.  History   Social History Narrative   Lives with daughter     Family History: History reviewed. No pertinent family history.  Physical Exam: Filed Vitals:   08/25/11 0751 08/25/11 1040 08/25/11 1155 08/25/11 1352  BP: 141/88 138/81 153/75 137/60  Pulse: 73 67 66 69  Temp: 99.2 F (37.3 C) 99.3 F (37.4 C)  99 F (  37.2 C)  TempSrc: Oral Oral  Oral  Resp: 22 16 16 18   Height: 5\' 11"  (1.803 m)   5\' 11"  (1.803 m)  Weight: 79.379 kg (175 lb)   81.3 kg (179 lb 3.7 oz)  SpO2: 100% 96% 100% 96%   General appearance: alert, cooperative, appears stated age and no distress Head: Normocephalic, without obvious abnormality, atraumatic Eyes: negative Nose: Nares normal. Septum midline. Mucosa normal. No drainage or sinus tenderness. Throat: no exudate, missing teeth Neck: no adenopathy, no carotid bruit, no JVD, supple, symmetrical, trachea midline and thyroid not enlarged, symmetric, no  tenderness/mass/nodules Back: symmetric, no curvature. ROM normal. No CVA tenderness. Resp: clear to auscultation bilaterally Cardio: regular rate and rhythm, S1, S2 normal, no murmur, click, rub or gallop GI: soft, non-tender; bowel sounds normal; no masses,  no organomegaly Extremities: extremities normal, atraumatic, no cyanosis or edema Pulses: 2+ and symmetric Skin: Skin color, texture, turgor normal. No rashes or lesions Neurologic: Grossly normal   Labs on Admission:   [facility] 08/25/11 1349 08/25/11 0825  NA -- 134*  K -- 3.3*  CL -- 91*  CO2 -- 35*  GLUCOSE -- 147*  BUN -- 25*  CREATININE -- 1.10  CALCIUM -- 9.2  MG 1.7 --  PHOS -- --    Basename 08/25/11 0825  AST 26  ALT 23  ALKPHOS 46  BILITOT 0.6  PROT 6.3  ALBUMIN 3.4*   No results found for this basename: LIPASE:2,AMYLASE:2 in the last 72 hours  Basename 08/25/11 0825  WBC 6.3  NEUTROABS 4.0  HGB 12.4*  HCT 37.0*  MCV 83.3  PLT 196    Basename 08/25/11 1404  CKTOTAL 464*  CKMB 4.1*  CKMBINDEX --  TROPONINI <0.30   No results found for this basename: TSH,T4TOTAL,FREET3,T3FREE,THYROIDAB in the last 72 hours No results found for this basename: VITAMINB12:2,FOLATE:2,FERRITIN:2,TIBC:2,IRON:2,RETICCTPCT:2 in the last 72 hours  Radiological Exams on Admission: No results found.  78 yo man admitted with Gi bleed admitted with Gi bleed    Assessment/Plan Present on Admission:  .Stroke .Coronary artery disease .Hypertension .GERD (gastroesophageal reflux disease) .History of stomach ulcers .GI bleed  Continue PPI drip  Await EGD Clear liquid diet Monitor CBCs    Latunya Kissick 08/25/2011, 6:25 PM

## 2011-08-26 ENCOUNTER — Encounter (HOSPITAL_COMMUNITY): Payer: Self-pay

## 2011-08-26 ENCOUNTER — Encounter (HOSPITAL_COMMUNITY)
Admission: EM | Disposition: A | Discharge status: Home / Self Care | Payer: Self-pay | Source: Home / Self Care | Attending: Internal Medicine

## 2011-08-26 DIAGNOSIS — Z8679 Personal history of other diseases of the circulatory system: Secondary | ICD-10-CM

## 2011-08-26 DIAGNOSIS — K210 Gastro-esophageal reflux disease with esophagitis: Secondary | ICD-10-CM

## 2011-08-26 DIAGNOSIS — K2901 Acute gastritis with bleeding: Secondary | ICD-10-CM

## 2011-08-26 DIAGNOSIS — K219 Gastro-esophageal reflux disease without esophagitis: Secondary | ICD-10-CM

## 2011-08-26 HISTORY — PX: ESOPHAGOGASTRODUODENOSCOPY: SHX5428

## 2011-08-26 LAB — CBC
Platelets: 176 10*3/uL (ref 150–400)
RBC: 4.12 MIL/uL — ABNORMAL LOW (ref 4.22–5.81)
WBC: 9.9 10*3/uL (ref 4.0–10.5)

## 2011-08-26 LAB — CARDIAC PANEL(CRET KIN+CKTOT+MB+TROPI)
CK, MB: 3.1 ng/mL (ref 0.3–4.0)
Total CK: 421 U/L — ABNORMAL HIGH (ref 7–232)
Troponin I: <0.3 ng/mL (ref <0.30)

## 2011-08-26 LAB — BASIC METABOLIC PANEL
CO2: 36 mEq/L — ABNORMAL HIGH (ref 19–32)
Calcium: 8.7 mg/dL (ref 8.4–10.5)
Chloride: 98 mEq/L (ref 96–112)
Potassium: 3.4 mEq/L — ABNORMAL LOW (ref 3.5–5.1)
Sodium: 137 mEq/L (ref 135–145)

## 2011-08-26 LAB — GLUCOSE, CAPILLARY
Glucose-Capillary: 110 mg/dL — ABNORMAL HIGH (ref 70–99)
Glucose-Capillary: 83 mg/dL (ref 70–99)
Glucose-Capillary: 198 mg/dL — ABNORMAL HIGH (ref 70–99)

## 2011-08-26 SURGERY — EGD (ESOPHAGOGASTRODUODENOSCOPY)
Anesthesia: Moderate Sedation

## 2011-08-26 MED ORDER — MIDAZOLAM HCL 10 MG/2ML IJ SOLN
INTRAMUSCULAR | Status: DC | PRN
  Administered 2011-08-26: 2 mg via INTRAVENOUS
  Administered 2011-08-26: 1 mg via INTRAVENOUS

## 2011-08-26 MED ORDER — POTASSIUM CHLORIDE 10 MEQ/100ML IV SOLN
10.0000 meq | INTRAVENOUS | Status: AC
  Administered 2011-08-26 (×3): 10 meq via INTRAVENOUS
  Filled 2011-08-26 (×3): qty 100

## 2011-08-26 MED ORDER — SODIUM CHLORIDE 0.9 % IV SOLN: Freq: Once | INTRAVENOUS | Status: DC

## 2011-08-26 MED ORDER — BUTAMBEN-TETRACAINE-BENZOCAINE 2-2-14 % EX AERO
INHALATION_SPRAY | CUTANEOUS | Status: DC | PRN
  Administered 2011-08-26: 2 via TOPICAL

## 2011-08-26 MED ORDER — FENTANYL NICU IV SYRINGE 50 MCG/ML
INJECTION | INTRAMUSCULAR | Status: DC | PRN
  Administered 2011-08-26 (×2): 25 ug via INTRAVENOUS

## 2011-08-26 MED ORDER — MIDAZOLAM HCL 10 MG/2ML IJ SOLN
INTRAMUSCULAR | Status: AC
  Filled 2011-08-26: qty 4

## 2011-08-26 MED ORDER — ASPIRIN EC 81 MG PO TBEC
81.0000 mg | DELAYED_RELEASE_TABLET | Freq: Every day | ORAL | Status: DC
  Administered 2011-08-26 – 2011-08-27 (×2): 81 mg via ORAL
  Filled 2011-08-26 (×3): qty 1

## 2011-08-26 MED ORDER — PROMETHAZINE HCL 25 MG PO TABS
25.0000 mg | ORAL_TABLET | Freq: Four times a day (QID) | ORAL | Status: DC | PRN
Start: 2011-08-26 — End: 2011-08-27

## 2011-08-26 MED ORDER — DIPHENHYDRAMINE HCL 50 MG/ML IJ SOLN
INTRAMUSCULAR | Status: AC
  Filled 2011-08-26: qty 1

## 2011-08-26 MED ORDER — FENTANYL CITRATE 0.05 MG/ML IJ SOLN
INTRAMUSCULAR | Status: AC
  Filled 2011-08-26: qty 4

## 2011-08-26 NOTE — Progress Notes (Signed)
   CARE MANAGEMENT NOTE 08/26/2011  Patient:  Jose Kennedy, Jose Kennedy   Account Number:  [id number]  Date Initiated:  08/26/2011  Documentation initiated by:  Letha Cape  Subjective/Objective Assessment:   dx gib  admit- lives with children     Action/Plan:   Anticipated DC Date:  08/29/2011   Anticipated DC Plan:  HOME W HOME HEALTH SERVICES      DC Planning Services  CM consult      Choice offered to / List presented to:             Status of service:  In process, will continue to follow Medicare Important Message given?   (If response is "NO", the following Medicare IM given date fields will be blank) Date Medicare IM given:   Date Additional Medicare IM given:    Discharge Disposition:    Per UR Regulation:    If discussed at Long Length of Stay Meetings, dates discussed:    Comments:  08/26/11 16:36 Letha Cape RN, BSN [phone] patient lives with children, GI consult.  NCM will continue to follow for dc needs.

## 2011-08-26 NOTE — Progress Notes (Signed)
Addendum  Patient seen and examined, chart and data base reviewed.  I agree with the above assessment and plan  For full details please see Mrs. Algis Downs PA. Note.  EGD showed no evidence of bleeding, GI recommended PPI and sucralfate for esophagitis.  Clint Lipps Pager: [phone] 08/26/2011, 4:43 PM

## 2011-08-26 NOTE — Progress Notes (Signed)
Patient ID: Churchill Grimsley, male   DOB: 1928-04-18, 78 y.o.   MRN: [id number] PATIENT DETAILS Name: Godfrey Tritschler Age: 78 y.o. Sex: male Date of Birth: [birth year] Admit Date: 08/25/2011 PCP:No primary provider on file.    Interim History: EGD with some gastric polyps (they were there previously) and esophagitis biopsy pending.  No further coffee ground emesis.   Subjective: No complaints.  Dry heaves thru our visit.  Objective: Weight change:   Intake/Output Summary (Last 24 hours) at 08/26/11 1553 Last data filed at 08/26/11 1300  Gross per 24 hour  Intake  552.5 ml  Output   1781 ml  Net -1228.5 ml   Blood pressure 162/78, pulse 63, temperature 98.5 F (36.9 C), temperature source Oral, resp. rate 20, height 5\' 11"  (1.803 m), weight 80.7 kg (177 lb 14.6 oz), SpO2 96.00%. Filed Vitals:   08/26/11 0940 08/26/11 0943 08/26/11 1027 08/26/11 1335  BP: 106/54 106/54 142/70 162/78  Pulse:   59 63  Temp:   98.7 F (37.1 C) 98.5 F (36.9 C)  TempSrc:   Oral Oral  Resp: 13 17 20 20   Height:      Weight:      SpO2: 100% 100% 100% 96%    Physical Exam: General:  Dry heaving.  But very pleasant.  Does not complain.  A&O Lungs: Clear to auscultation bilaterally without wheezes or crackles Cardiovascular: Regular rate and rhythm without murmur gallop or rub normal S1 and S2 Abdomen: Nontender, nondistended, soft, bowel sounds positive, no rebound, no ascites, no appreciable mass Extremities: No significant cyanosis, clubbing, or edema bilateral lower extremities  Basic Metabolic Panel:  Lab 08/26/11 4098 08/25/11 1349 08/25/11 0825  NA 137 -- 134*  K 3.4* -- 3.3*  CL 98 -- 91*  CO2 36* -- 35*  GLUCOSE 99 -- 147*  BUN 14 -- 25*  CREATININE 1.08 -- 1.10  CALCIUM 8.7 -- 9.2  MG -- 1.7 --  PHOS -- -- --   Liver Function Tests:  Lab 08/25/11 0825  AST 26  ALT 23  ALKPHOS 46  BILITOT 0.6  PROT 6.3  ALBUMIN 3.4*   CBC:  Lab 08/26/11 0552 08/25/11 1820 08/25/11 0825    WBC 9.9 9.3 --  NEUTROABS -- -- 4.0  HGB 11.4* 12.2* --  HCT 34.9* 37.2* --  MCV 84.7 85.1 --  PLT 176 188 --   Cardiac Enzymes:  Lab 08/26/11 0514 08/25/11 2114 08/25/11 1404  CKTOTAL 421* 417* 464*  CKMB 3.1 2.9 4.1*  CKMBINDEX -- -- --  TROPONINI <0.30 <0.30 <0.30    CBG:  Lab 08/26/11 1148 08/26/11 0800 08/26/11 0324 08/25/11 2221 08/25/11 1744  GLUCAP 83 94 110* 154* 168*   Studies/Results: Scheduled Meds:    . amLODipine  10 mg Oral Daily  . aspirin EC  81 mg Oral Daily  . atorvastatin  40 mg Oral q1800  . carvedilol  25 mg Oral BID WC  . insulin aspart  0-5 Units Subcutaneous QHS  . insulin aspart  0-9 Units Subcutaneous TID WC  . insulin glargine  5 Units Subcutaneous QHS  . latanoprost  1 drop Both Eyes QHS  . potassium chloride  10 mEq Intravenous Q1 Hr x 3  . potassium chloride  40 mEq Oral Once  . sodium chloride  3 mL Intravenous Q12H  . sucralfate  1 g Oral TID AC & HS  . DISCONTD: sodium chloride   Intravenous Once   Continuous Infusions:    . pantoprozole (  PROTONIX) infusion 8 mg/hr (08/26/11 1256)   PRN Meds:.acetaminophen, acetaminophen, alum & mag hydroxide-simeth, ondansetron (ZOFRAN) IV, ondansetron, promethazine, DISCONTD: butamben-tetracaine-benzocaine, DISCONTD: fentaNYL, DISCONTD: midazolam  Anti-infectives:  Anti-infectives    None      Assessment/Plan: Principal Problem:  *GI bleed Active Problems:  Stroke  Coronary artery disease  Hypertension  GERD (gastroesophageal reflux disease)  History of stomach ulcers   Hematemesis.   No further bleeding.   Check CBC in am. Egd completed 08/26/11.  Dr. Evette Cristal [facility]) recommends PPI and Sucralfate therapy with outpatient follow up.  Nausea with dry heaves. Symptomatic treatment with low residue diet soft, phenergan (PO) and Zofran. ? Diabetic gastro paresis?  Consider low dose reglan.  DM CBGs controlled on novolog and lantus.  Takes 70/30 at home.  H/O Stroke and CAD Will  resume 81 mg aspirin   DVT Prophylaxis:  SCDs (hematemesis)  Disposition.  No further work up scheduled.  When patient can eat and his hgb is confirmed to be stable he can discharge to home.   LOS: 1 day   Stephani Police 08/26/2011, 3:53 PM [phone]

## 2011-08-26 NOTE — Op Note (Signed)
[facility] [address] [place], [place]  [address]  ENDOSCOPY PROCEDURE REPORT  PATIENT:  Jose Kennedy, Jose Kennedy  MR#:  [id number] BIRTHDATE:  [birth year], 83 yrs. old  GENDER:  male  ENDOSCOPIST:  Wandalee Ferdinand, MD Referred by:  PROCEDURE DATE:  08/26/2011 PROCEDURE: EGD with biopsy ASA CLASS: 2 INDICATIONS: Coffee-ground emesis  MEDICATIONS: Fentanyl 50 mcg IV, Versed 3 mg IV TOPICAL ANESTHETIC: Cetacaine spray  DESCRIPTION OF PROCEDURE:   After the risks benefits and alternatives of the procedure were thoroughly explained, informed consent was obtained.  The EG-[id number] ([id number]) endoscope was introduced through the mouth and advanced to the second portion of the duodenum , without limitations.  The instrument was slowly withdrawn as the mucosa was fully examined. <<PROCEDUREIMAGES>>  FINDINGS: The second portion of bulb of the duodenum were normal. The stomach showed scattered small fleshy appearing antral and gastric body polyps which appear completely benign. On retroflexion there was a pedunculated polyp in the cardia which was reddish in color and on a very thick stock and there also was a reddish polyp noted at the base of the stalk. Biopsy was obtained of the polyp in the cardia for histology. Of note is that this polyp has been present in the past, and found to be benign, and the patient was told just to leave it alone. This prior evaluation was done at [facility]. The esophagus showed streaky redness of the mucosa in the distal and midportion suggestive of esophagitis and biopsies were obtained of the esophagus for histology. There was no evidence of active bleeding on this examination.  COMPLICATIONS: None  ENDOSCOPIC IMPRESSION: See above  RECOMMENDATIONS: Continue PPI therapy and also Carafate which he has been on. The biopsies will be checked. Since there does not appear to be any active bleeding I think that patient can probably go home  and followup as an outpatient provided he can tolerate his diet and oral medications.  ______________________________ Wandalee Ferdinand, MD  CC:  n. eSIGNED:   Sam Maheen Cwikla at 08/26/2011 09:20 AM  Donell Sievert, [phone]

## 2011-08-27 ENCOUNTER — Telehealth: Payer: Self-pay | Admitting: Physician Assistant

## 2011-08-27 ENCOUNTER — Encounter (HOSPITAL_COMMUNITY): Payer: Self-pay | Admitting: Gastroenterology

## 2011-08-27 DIAGNOSIS — K219 Gastro-esophageal reflux disease without esophagitis: Secondary | ICD-10-CM

## 2011-08-27 DIAGNOSIS — Z8679 Personal history of other diseases of the circulatory system: Secondary | ICD-10-CM

## 2011-08-27 DIAGNOSIS — K210 Gastro-esophageal reflux disease with esophagitis: Secondary | ICD-10-CM

## 2011-08-27 DIAGNOSIS — K2901 Acute gastritis with bleeding: Secondary | ICD-10-CM

## 2011-08-27 LAB — CBC
HCT: 37.2 % — ABNORMAL LOW (ref 39.0–52.0)
MCHC: 32.5 g/dL (ref 30.0–36.0)
Platelets: 195 10*3/uL (ref 150–400)
RDW: 14 % (ref 11.5–15.5)

## 2011-08-27 LAB — GLUCOSE, CAPILLARY
Glucose-Capillary: 125 mg/dL — ABNORMAL HIGH (ref 70–99)
Glucose-Capillary: 162 mg/dL — ABNORMAL HIGH (ref 70–99)

## 2011-08-27 LAB — BASIC METABOLIC PANEL
BUN: 12 mg/dL (ref 6–23)
GFR calc Af Amer: 88 mL/min — ABNORMAL LOW (ref >90)
GFR calc non Af Amer: 76 mL/min — ABNORMAL LOW (ref >90)
Potassium: 4 mEq/L (ref 3.5–5.1)
Sodium: 139 mEq/L (ref 135–145)

## 2011-08-27 LAB — PROTIME-INR: Prothrombin Time: 13.7 seconds (ref 11.6–15.2)

## 2011-08-27 MED ORDER — CARVEDILOL 25 MG PO TABS: 25.0000 mg | ORAL_TABLET | Freq: Two times a day (BID) | ORAL | Status: DC

## 2011-08-27 MED ORDER — OMEPRAZOLE-SODIUM BICARBONATE 40-1100 MG PO CAPS: 1.0000 | ORAL_CAPSULE | Freq: Every day | ORAL | Status: DC

## 2011-08-27 MED ORDER — SUCRALFATE 1 G PO TABS: 1.0000 g | ORAL_TABLET | Freq: Four times a day (QID) | ORAL | Status: DC

## 2011-08-27 MED ORDER — PANTOPRAZOLE SODIUM 40 MG PO TBEC
40.0000 mg | DELAYED_RELEASE_TABLET | Freq: Two times a day (BID) | ORAL | Status: DC
  Administered 2011-08-27: 40 mg via ORAL
  Filled 2011-08-27: qty 1

## 2011-08-27 NOTE — Telephone Encounter (Signed)
Called [facility] and spoke to pharmacist.  She was looking for Cordelia Pen NP.  I told her this is the wrong office and I looked at the Hospitalist note and Cordelia Pen NP's pager number was on the note, 08-27-2011.  I gave it to the pharmacist.

## 2011-08-27 NOTE — Discharge Summary (Signed)
Physician Discharge Summary  Patient ID: Winfield Caba MRN: [id number] DOB/AGE: [birth year] 78 y.o.  Admit date: 08/25/2011 Discharge date: 08/27/2011    Discharge Diagnoses:  1. Hematemesis 2. Esophagistis per EGD 08/26/11 3. Diabetes mellitus 4. Hx of CVA 5. Hx of CAD 6. GERD  Consults: Gastroenterology, Dr. Evette Cristal  Brief Summary: Khup Sapia is a 78 y.o. y/o male with a PMH of extensive hx of GERD, hiccups, upper GI bleed, gastric polyp, with EGDs at [facility], [facility] in the past , admitted on 4/30 for recurrent coffee ground emesis. He described this as coffee-ground appearing material. His hemoglobin was 12.4. He denied vomiting bright red blood. He has a history of a large gastric polyp near the cardia of his stomach which was evaluated at [facility] several years ago and he was told to leave this alone do to its benign nature and also its location. He has been on Zegerid for quite some time and was also on Carafate but stopped taking that in January. He has been experiencing intermittent vomiting and recently some coffee-ground. Because of the coffee-ground emesis he was referred to [facility] for admission.   Hospital Course by Discharge Summary  1. Hematemesis: No recurrent bleeding during hospitalization. Pt underwent upper endoscopy on 08/26/11 showing polyps appearing benign and presumed esophagitis in the esophagus. No active bleeding was seen. Biopsies taken during procedure are pending. Per GI recommendations, pt is to continue PPI therapy and Carafate that he stopped taking in January. He is currently tolerating po's without any recurrent symptoms. Hemoglobin is stable.   2. Nausea: Now resolved. This could possibly be viral in nature. He underwent gastric emptying study in 2012 that did not show any evidence of delayed emptying. No need for reglan at this time. Again, pt is tolerating regular diet without any recurrent symptoms.   Other chronic medical issues  remained stable throughout hospitalization.    Significant Diagnostic Studies:  ENDOSCOPIST: Wandalee Ferdinand, MD  Referred by:  PROCEDURE DATE: 08/26/2011  PROCEDURE: EGD with biopsy  ASA CLASS: 2  INDICATIONS: Coffee-ground emesis FINDINGS: The second portion of bulb of the duodenum were normal.  The stomach showed scattered small fleshy appearing antral and  gastric body polyps which appear completely benign. On  retroflexion there was a pedunculated polyp in the cardia which  was reddish in color and on a very thick stock and there also was  a reddish polyp noted at the base of the stalk. Biopsy was  obtained of the polyp in the cardia for histology. Of note is that  this polyp has been present in the past, and found to be benign,  and the patient was told just to leave it alone. This prior  evaluation was done at [facility]. The esophagus showed  streaky redness of the mucosa in the distal and midportion  suggestive of esophagitis and biopsies were obtained of the  esophagus for histology. There was no evidence of active bleeding  on this examination.  COMPLICATIONS: None  ENDOSCOPIC IMPRESSION: See above  RECOMMENDATIONS: Continue PPI therapy and also Carafate which he  has been on. The biopsies will be checked. Since there does not  appear to be any active bleeding I think that patient can probably  go home and followup as an outpatient provided he can tolerate his  diet and oral medications.   Discharge Exam: General: Awake, alert sitting upright in bed eating in NAD CV: S1S2 RRR, no m/r/g Resp: CTAB, no w/r/c, no increased wob GI:  abdomen soft, NT/ND, BS+ Neuro: no focal m/s deficits on exam Psych: alert and oriented    Discharge Labs  BMET  Lab 08/27/11 0554 08/26/11 0552 08/25/11 1349 08/25/11 0825  NA 139 137 -- 134*  K 4.0 3.4* -- --  CL 101 98 -- 91*  CO2 30 36* -- 35*  GLUCOSE 118* 99 -- 147*  BUN 12 14 -- 25*  CREATININE 0.95 1.08 -- 1.10    CALCIUM 8.8 8.7 -- 9.2  MG -- -- 1.7 --  PHOS -- -- -- --     CBC   Lab 08/27/11 0554 08/26/11 0552 08/25/11 1820  HGB 12.1* 11.4* 12.2*  HCT 37.2* 34.9* 37.2*  WBC 6.3 9.9 9.3  PLT 195 176 188      Discharge Orders    Future Orders Please Complete By Expires   Diet - low sodium heart healthy      Increase activity slowly      Discharge instructions      Comments:   Please arrange follow-up with primary care provider in 1-2 weeks.   PT eval and treat   08/26/12           [place], Satz  Home Medication Instructions ONG:[id number]   Printed on:08/27/11 1151  Medication Information                    hydrALAZINE (APRESOLINE) 10 MG tablet Take 10 mg by mouth 3 (three) times daily.           carvedilol (COREG) 25 MG tablet Take 25 mg by mouth 2 (two) times daily with a meal.           losartan (COZAAR) 100 MG tablet Take 100 mg by mouth daily.           hydrochlorothiazide (HYDRODIURIL) 25 MG tablet Take 25 mg by mouth daily.           amLODipine (NORVASC) 10 MG tablet Take 10 mg by mouth daily.           docusate sodium (COLACE) 100 MG capsule Take 100 mg by mouth 2 (two) times daily.           simvastatin (ZOCOR) 80 MG tablet Take 80 mg by mouth at bedtime.           aspirin 81 MG tablet Take 81 mg by mouth daily.           ferrous sulfate 325 (65 FE) MG tablet Take 325 mg by mouth 2 (two) times daily.           insulin NPH-insulin regular (NOVOLIN 70/30) (70-30) 100 UNIT/ML injection Inject 12 Units into the skin 2 (two) times daily with a meal.           Multiple Vitamins-Minerals (CENTRUM SILVER PO) Take 1 tablet by mouth daily.           latanoprost (XALATAN) 0.005 % ophthalmic solution Place 1 drop into both eyes at bedtime.           carvedilol (COREG) 25 MG tablet Take 1 tablet (25 mg total) by mouth 2 (two) times daily with a meal.           sucralfate (CARAFATE) 1 G tablet Take 1 tablet (1 g total) by mouth 4 (four) times daily.            omeprazole-sodium bicarbonate (ZEGERID) 40-1100 MG per capsule Take 1 capsule by mouth daily before breakfast.  Disposition: Home with daughter. Will arrange HHPT and RN. Discharge instructions discussed with pt's daughter over the phone. Pt will need follow-up with his PCP in 1-2 weeks.   Discharged Condition: Adriano Bischof has met maximum benefit of inpatient care and is medically stable and cleared for discharge.  Patient is pending follow up as above.      Time spent on disposition:  Greater than 35 minutes.   Cordelia Pen, NP-C [facility]  pgr [phone]

## 2011-08-27 NOTE — Discharge Summary (Signed)
Addendum  Patient seen and examined, chart and data base reviewed.  I agree with the above assessment and plan  For full details please see Mrs. Cordelia Pen NP note.  Hematemesis completely resolved, EGD showed reflux esophagitis patient will be on full dose of PPI.  Clint Lipps Pager: [phone] 08/27/2011, 1:57 PM

## 2011-08-27 NOTE — Evaluation (Signed)
Physical Therapy Evaluation Patient Details Name: Jose Kennedy MRN: [id number] DOB: [birth year] Today's Date: 08/27/2011 Time: 4098-1191 PT Time Calculation (min): 20 min  PT Assessment / Plan / Recommendation Clinical Impression  Pt adm with GI bleed.  Pt initially unsteady with amb but improved with incr distance.  Recommend HHPT.  Also recommend family stay close to pt with mobility this evening/night.    PT Assessment  Patient needs continued PT services    Follow Up Recommendations  Home health PT    Equipment Recommendations  None recommended by PT    Frequency Min 3X/week    Precautions / Restrictions Precautions Precautions: Fall   Pertinent Vitals/Pain N/A      Mobility  Bed Mobility Bed Mobility: Supine to Sit;Sitting - Scoot to Edge of Bed Supine to Sit: 6: Modified independent (Device/Increase time);HOB flat Sitting - Scoot to Edge of Bed: 6: Modified independent (Device/Increase time) Transfers Transfers: Sit to Stand;Stand to Sit Sit to Stand: 4: Min assist;5: Supervision;With upper extremity assist;From bed;From chair/3-in-1 (initial stand from bed pt with posterior lean. Later S f) Stand to Sit: 5: Supervision;With upper extremity assist;With armrests;To chair/3-in-1 Details for Transfer Assistance: On initial sit to stand pt with posterior lean and required min A.  After amb pt able to stand from chair with only S. Ambulation/Gait Ambulation/Gait Assistance: 4: Min assist;5: Supervision Ambulation Distance (Feet): 225 Feet Assistive device: Straight cane Ambulation/Gait Assistance Details: Pt needed min A for first 20 feet due to unsteadiness.  Then pt improved and only needed S after that point. Gait Pattern: Narrow base of support    Exercises     PT Goals Acute Rehab PT Goals PT Goal Formulation: With patient Time For Goal Achievement: 09/03/11 Potential to Achieve Goals: Good Pt will go Sit to Stand: with modified independence PT Goal: Sit to  Stand - Progress: Goal set today Pt will go Stand to Sit: with modified independence PT Goal: Stand to Sit - Progress: Goal set today Pt will Ambulate: 51 - 150 feet;with modified independence;with least restrictive assistive device PT Goal: Ambulate - Progress: Goal set today  Visit Information  Last PT Received On: 08/27/11 Assistance Needed: +1    Subjective Data  Subjective: Pt states his daughter works as a Education officer, community. Patient Stated Goal: return home   Prior Functioning  Home Living Lives With: Daughter Available Help at Discharge: Family;Available PRN/intermittently Type of Home: House Home Access: Level entry Home Layout: Multi-level;Able to live on main level with bedroom/bathroom Bathroom Shower/Tub: Walk-in shower Home Adaptive Equipment: Straight cane Prior Function Level of Independence: Independent with assistive device(s) (Uses straight cane) Driving: No Vocation: Retired Musician: No difficulties    Cognition  Overall Cognitive Status: Appears within functional limits for tasks assessed/performed Arousal/Alertness: Awake/alert Orientation Level: Appears intact for tasks assessed Behavior During Session: [facility] for tasks performed    Extremity/Trunk Assessment Right Lower Extremity Assessment RLE ROM/Strength/Tone: Within functional levels Left Lower Extremity Assessment LLE ROM/Strength/Tone: Within functional levels   Balance Static Standing Balance Static Standing - Balance Support: Right upper extremity supported (with cane) Static Standing - Level of Assistance: 5: Stand by assistance  End of Session PT - End of Session Equipment Utilized During Treatment: Gait belt Activity Tolerance: Patient tolerated treatment well Patient left: in chair;with call bell/phone within reach Nurse Communication: Mobility status   Jose Kennedy 08/27/2011, 12:49 PM  [employer] PT [phone]

## 2011-08-27 NOTE — Progress Notes (Signed)
Jose Kennedy to be D/C'd Home per MD order.  Discussed with the patient and all questions fully answered.   Rhylan, Kagel  Home Medication Instructions RUE:[id number]   Printed on:08/27/11 1855  Medication Information                    hydrALAZINE (APRESOLINE) 10 MG tablet Take 10 mg by mouth 3 (three) times daily.           carvedilol (COREG) 25 MG tablet Take 25 mg by mouth 2 (two) times daily with a meal.           losartan (COZAAR) 100 MG tablet Take 100 mg by mouth daily.           hydrochlorothiazide (HYDRODIURIL) 25 MG tablet Take 25 mg by mouth daily.           amLODipine (NORVASC) 10 MG tablet Take 10 mg by mouth daily.           docusate sodium (COLACE) 100 MG capsule Take 100 mg by mouth 2 (two) times daily.           simvastatin (ZOCOR) 80 MG tablet Take 80 mg by mouth at bedtime.           aspirin 81 MG tablet Take 81 mg by mouth daily.           ferrous sulfate 325 (65 FE) MG tablet Take 325 mg by mouth 2 (two) times daily.           insulin NPH-insulin regular (NOVOLIN 70/30) (70-30) 100 UNIT/ML injection Inject 12 Units into the skin 2 (two) times daily with a meal.           Multiple Vitamins-Minerals (CENTRUM SILVER PO) Take 1 tablet by mouth daily.           latanoprost (XALATAN) 0.005 % ophthalmic solution Place 1 drop into both eyes at bedtime.           carvedilol (COREG) 25 MG tablet Take 1 tablet (25 mg total) by mouth 2 (two) times daily with a meal.           sucralfate (CARAFATE) 1 G tablet Take 1 tablet (1 g total) by mouth 4 (four) times daily.           omeprazole-sodium bicarbonate (ZEGERID) 40-1100 MG per capsule Take 1 capsule by mouth daily before breakfast.             VVS, Skin clean, dry and intact without evidence of skin break down, no evidence of skin tears noted. IV catheter discontinued intact. Site without signs and symptoms of complications. Dressing and pressure applied.  An After Visit Summary was printed and given  to the patient. Follow up appointments , new prescriptions and medication administration times given Patient escorted via WC, and D/C home via private auto.  Cindra Eves, RN 08/27/2011 6:55 PM

## 2011-08-28 NOTE — Care Management Note (Signed)
    Page 1 of 2   08/28/2011     10:09:38 AM   CARE MANAGEMENT NOTE 08/28/2011  Patient:  Jose Kennedy, Jose Kennedy   Account Number:  [id number]  Date Initiated:  08/26/2011  Documentation initiated by:  Letha Cape  Subjective/Objective Assessment:   dx gib  admit- lives with children     Action/Plan:   PT eval   Anticipated DC Date:  08/29/2011   Anticipated DC Plan:  HOME W HOME HEALTH SERVICES      DC Planning Services  CM consult      [facility] Choice  HOME HEALTH   Choice offered to / List presented to:  C-4 Adult Children        HH arranged  HH-2 PT  HH-4 NURSE'S AIDE      HH agency  [facility]   Status of service:  Completed, signed off Medicare Important Message given?   (If response is "NO", the following Medicare IM given date fields will be blank) Date Medicare IM given:   Date Additional Medicare IM given:    Discharge Disposition:  HOME W HOME HEALTH SERVICES  Per UR Regulation:    If discussed at Long Length of Stay Meetings, dates discussed:    Comments:  08/28/11- 1000- Donn Pierini RN, BSN [phone] Follow up done regarding HH needs- spoke with charge nurse who had spoken with daughter when she came to pick up pt- list had been given to daughter who left list with circled choice of [facility]- referral called to [facility] rep- Lupita Leash and placed in [facility]- services to begin within 24-48 hrs.  08/27/11- 1400- Donn Pierini RN, BSN [phone] Pt for discharge today, order for HH-PT/aide, spoke with pt at bedside- pt agreeable to [facility] states he has had HH in past but does not remember agency used. Will speak with daughter regarding agency choice. Call made to daughter- message left for her to return call regarding HH arrangements.  update- 1700- Donn Pierini- RN, BSN-  have still not heard from pt's daughter, left [facility] list for [employer] with pt's nurse to give to daughter, attempted to reach daughter again via phone without success. Will f/u in am regarding [facility] agency  choice.  08/26/11 16:36 Letha Cape RN, BSN [phone] patient lives with children, GI consult.  NCM will continue to follow for dc needs.

## 2012-12-12 ENCOUNTER — Inpatient Hospital Stay (HOSPITAL_BASED_OUTPATIENT_CLINIC_OR_DEPARTMENT_OTHER)
Admission: EM | Admit: 2012-12-12 | Discharge: 2012-12-14 | DRG: 378 | Disposition: A | Discharge status: Home / Self Care | Payer: Medicare Other | Attending: Internal Medicine | Admitting: Internal Medicine

## 2012-12-12 ENCOUNTER — Emergency Department (HOSPITAL_BASED_OUTPATIENT_CLINIC_OR_DEPARTMENT_OTHER): Payer: Medicare Other

## 2012-12-12 ENCOUNTER — Encounter (HOSPITAL_BASED_OUTPATIENT_CLINIC_OR_DEPARTMENT_OTHER): Payer: Self-pay | Admitting: *Deleted

## 2012-12-12 DIAGNOSIS — Z8673 Personal history of transient ischemic attack (TIA), and cerebral infarction without residual deficits: Secondary | ICD-10-CM

## 2012-12-12 DIAGNOSIS — Z7982 Long term (current) use of aspirin: Secondary | ICD-10-CM

## 2012-12-12 DIAGNOSIS — Z9089 Acquired absence of other organs: Secondary | ICD-10-CM

## 2012-12-12 DIAGNOSIS — I499 Cardiac arrhythmia, unspecified: Secondary | ICD-10-CM

## 2012-12-12 DIAGNOSIS — E119 Type 2 diabetes mellitus without complications: Secondary | ICD-10-CM

## 2012-12-12 DIAGNOSIS — N289 Disorder of kidney and ureter, unspecified: Secondary | ICD-10-CM

## 2012-12-12 DIAGNOSIS — E860 Dehydration: Secondary | ICD-10-CM | POA: Diagnosis present

## 2012-12-12 DIAGNOSIS — E785 Hyperlipidemia, unspecified: Secondary | ICD-10-CM | POA: Diagnosis present

## 2012-12-12 DIAGNOSIS — K922 Gastrointestinal hemorrhage, unspecified: Secondary | ICD-10-CM

## 2012-12-12 DIAGNOSIS — N179 Acute kidney failure, unspecified: Secondary | ICD-10-CM | POA: Diagnosis present

## 2012-12-12 DIAGNOSIS — K219 Gastro-esophageal reflux disease without esophagitis: Secondary | ICD-10-CM

## 2012-12-12 DIAGNOSIS — I10 Essential (primary) hypertension: Secondary | ICD-10-CM

## 2012-12-12 DIAGNOSIS — Z87891 Personal history of nicotine dependence: Secondary | ICD-10-CM

## 2012-12-12 DIAGNOSIS — K920 Hematemesis: Principal | ICD-10-CM

## 2012-12-12 DIAGNOSIS — Z79899 Other long term (current) drug therapy: Secondary | ICD-10-CM

## 2012-12-12 DIAGNOSIS — I639 Cerebral infarction, unspecified: Secondary | ICD-10-CM

## 2012-12-12 DIAGNOSIS — Z794 Long term (current) use of insulin: Secondary | ICD-10-CM

## 2012-12-12 DIAGNOSIS — I2510 Atherosclerotic heart disease of native coronary artery without angina pectoris: Secondary | ICD-10-CM | POA: Diagnosis present

## 2012-12-12 LAB — CBC WITH DIFFERENTIAL/PLATELET
Basophils Relative: 0 % (ref 0–1)
Eosinophils Absolute: 0.4 10*3/uL (ref 0.0–0.7)
Hemoglobin: 12.6 g/dL — ABNORMAL LOW (ref 13.0–17.0)
MCH: 27.6 pg (ref 26.0–34.0)
MCHC: 32 g/dL (ref 30.0–36.0)
Neutro Abs: 2.6 10*3/uL (ref 1.7–7.7)
Neutrophils Relative %: 48 % (ref 43–77)
Platelets: 190 10*3/uL (ref 150–400)
RBC: 4.57 MIL/uL (ref 4.22–5.81)

## 2012-12-12 LAB — BASIC METABOLIC PANEL
BUN: 36 mg/dL — ABNORMAL HIGH (ref 6–23)
Calcium: 9.7 mg/dL (ref 8.4–10.5)
GFR calc Af Amer: 52 mL/min — ABNORMAL LOW (ref >90)
GFR calc non Af Amer: 45 mL/min — ABNORMAL LOW (ref >90)
Glucose, Bld: 181 mg/dL — ABNORMAL HIGH (ref 70–99)
Potassium: 3.7 mEq/L (ref 3.5–5.1)
Sodium: 137 mEq/L (ref 135–145)

## 2012-12-12 LAB — TROPONIN I: Troponin I: <0.3 ng/mL (ref <0.30)

## 2012-12-12 MED ORDER — SODIUM CHLORIDE 0.9 % IV BOLUS (SEPSIS)
500.0000 mL | Freq: Once | INTRAVENOUS | Status: AC
  Administered 2012-12-12: 500 mL via INTRAVENOUS

## 2012-12-12 NOTE — ED Provider Notes (Signed)
CSN: [id number]     Arrival date & time 12/12/12  2118 History     First MD Initiated Contact with Patient 12/12/12 2136     Chief Complaint  Patient presents with  . Irregular Heart Beat   (Consider location/radiation/quality/duration/timing/severity/associated sxs/prior Treatment) HPI Comments: Pt states that he is having an right sided chest pain that is "muscle related" and when he checks his pulse it skips beats:pt states that he came in today because it was happening more frequently:pt denies being seen for the this:pt states that pain is worse with movement:pt states that he is not having vomiting, cough, fever, or sob:pt states that he was seen a cardiologist in the past, for hypertension that was hard to manage  The history is provided by the patient. No language interpreter was used.    Past Medical History  Diagnosis Date  . Stroke   . Coronary artery disease   . Hypertension   . GERD (gastroesophageal reflux disease)   . History of stomach ulcers   . Diabetes mellitus     insulin dependent   Past Surgical History  Procedure Laterality Date  . Laparoscopic cholecystectomy    . Back surgery    . Thyroid nodual    . Appendectomy    . Esophagogastroduodenoscopy  08/26/2011    Procedure: ESOPHAGOGASTRODUODENOSCOPY (EGD);  Surgeon: Graylin Shiver, MD;  [facility] END[facility]COPY;  Service: Endoscopy;  Laterality: N/A;   History reviewed. No pertinent family history. History  Substance Use Topics  . Smoking status: Former Games developer  . Smokeless tobacco: Never Used  . Alcohol Use: No    Review of Systems  Constitutional: Negative.  Negative for activity change.  Respiratory: Negative.   Cardiovascular: Negative.     Allergies  Review of patient's allergies indicates no known allergies.  Home Medications   Current Outpatient Rx  Name  Route  Sig  Dispense  Refill  . amLODipine (NORVASC) 10 MG tablet   Oral   Take 10 mg by mouth daily.         Marland Kitchen aspirin 81 MG  tablet   Oral   Take 81 mg by mouth daily.         . carvedilol (COREG) 25 MG tablet   Oral   Take 25 mg by mouth 2 (two) times daily with a meal.         . carvedilol (COREG) 25 MG tablet   Oral   Take 1 tablet (25 mg total) by mouth 2 (two) times daily with a meal.         . docusate sodium (COLACE) 100 MG capsule   Oral   Take 100 mg by mouth 2 (two) times daily.         . ferrous sulfate 325 (65 FE) MG tablet   Oral   Take 325 mg by mouth 2 (two) times daily.         . hydrALAZINE (APRESOLINE) 10 MG tablet   Oral   Take 10 mg by mouth 3 (three) times daily.         . hydrochlorothiazide (HYDRODIURIL) 25 MG tablet   Oral   Take 25 mg by mouth daily.         . insulin NPH-insulin regular (NOVOLIN 70/30) (70-30) 100 UNIT/ML injection   Subcutaneous   Inject 12 Units into the skin 2 (two) times daily with a meal.         . latanoprost (XALATAN) 0.005 % ophthalmic solution  Both Eyes   Place 1 drop into both eyes at bedtime.         Marland Kitchen losartan (COZAAR) 100 MG tablet   Oral   Take 100 mg by mouth daily.         . Multiple Vitamins-Minerals (CENTRUM SILVER PO)   Oral   Take 1 tablet by mouth daily.         Marland Kitchen EXPIRED: omeprazole-sodium bicarbonate (ZEGERID) 40-1100 MG per capsule   Oral   Take 1 capsule by mouth daily before breakfast.   30 capsule   0   . simvastatin (ZOCOR) 80 MG tablet   Oral   Take 80 mg by mouth at bedtime.         . sucralfate (CARAFATE) 1 G tablet   Oral   Take 1 tablet (1 g total) by mouth 4 (four) times daily.   120 tablet   0    BP 152/95  Pulse 68  Temp(Src) 99.1 F (37.3 C) (Oral)  Resp 18  Ht 5\' 11"  (1.803 m)  Wt 180 lb (81.647 kg)  BMI 25.12 kg/m2  SpO2 99% Physical Exam  Nursing note and vitals reviewed. Constitutional: He is oriented to person, place, and time. He appears well-developed and well-nourished.  HENT:  Head: Normocephalic and atraumatic.  Eyes: Conjunctivae and EOM are normal.   Neck: Normal range of motion. Neck supple.  Cardiovascular: Normal rate and regular rhythm.   Checking pulse pt has on skipped beat out of 60:not noted on monitor  Pulmonary/Chest: Effort normal and breath sounds normal.  Abdominal: Soft. Bowel sounds are normal. There is no tenderness.  Musculoskeletal: Normal range of motion. He exhibits edema.  Neurological: He is alert and oriented to person, place, and time.  Skin: Skin is warm and dry.    ED Course   Procedures (including critical care time)  Labs Reviewed  BASIC METABOLIC PANEL - Abnormal; Notable for the following:    CO2 35 (*)    Glucose, Bld 181 (*)    BUN 36 (*)    Creatinine, Ser 1.40 (*)    GFR calc non Af Amer 45 (*)    GFR calc Af Amer 52 (*)    All other components within normal limits  CBC WITH DIFFERENTIAL - Abnormal; Notable for the following:    Hemoglobin 12.6 (*)    Eosinophils Relative 8 (*)    All other components within normal limits  POCT GASTRIC OCCULT BLOOD (1-CARD TO LAB) - Abnormal; Notable for the following:    Occult Blood, Gastric POSITIVE (*)    All other components within normal limits  TROPONIN I  PH, GASTRIC FLUID (GASTROCCULT CARD)  HEPATIC FUNCTION PANEL  URINALYSIS, ROUTINE W REFLEX MICROSCOPIC   Date: 12/13/2012  Rate: 67  Rhythm: normal sinus rhythm  QRS Axis: normal  Intervals: normal  ST/T Wave abnormalities: nonspecific T wave changes  Conduction Disutrbances:first-degree A-V block   Narrative Interpretation:   Old EKG Reviewed: unchanged   Dg Chest 2 View  12/12/2012   *RADIOLOGY REPORT*  Clinical Data: Chest pain.  Palpitations.  CHEST - 2 VIEW  Comparison: 10/27/2009  Findings: Heart size and vascularity are normal and the lungs are clear.  There is calcification and tortuosity of the thoracic aorta.  No acute osseous abnormality.  IMPRESSION: No acute disease.   Original Report Authenticated By: Francene Boyers, M.D.   1. Renal insufficiency   2. Irregular heart beat    3. Hematemesis  MDM  Pt started having coffee ground emesis while here in the er:pt has similar history:will admit for that:cardiac work up is negative:[facility]  Teressa Lower, NP 12/13/12 0025

## 2012-12-12 NOTE — ED Notes (Signed)
Pt c/o palpitations x 4 days " comes and goes" "

## 2012-12-13 ENCOUNTER — Encounter (HOSPITAL_COMMUNITY): Payer: Self-pay | Admitting: Internal Medicine

## 2012-12-13 DIAGNOSIS — E119 Type 2 diabetes mellitus without complications: Secondary | ICD-10-CM

## 2012-12-13 DIAGNOSIS — K922 Gastrointestinal hemorrhage, unspecified: Secondary | ICD-10-CM

## 2012-12-13 DIAGNOSIS — I6350 Cerebral infarction due to unspecified occlusion or stenosis of unspecified cerebral artery: Secondary | ICD-10-CM

## 2012-12-13 DIAGNOSIS — N289 Disorder of kidney and ureter, unspecified: Secondary | ICD-10-CM | POA: Diagnosis present

## 2012-12-13 DIAGNOSIS — K920 Hematemesis: Principal | ICD-10-CM

## 2012-12-13 DIAGNOSIS — I10 Essential (primary) hypertension: Secondary | ICD-10-CM

## 2012-12-13 LAB — COMPREHENSIVE METABOLIC PANEL
ALT: 25 U/L (ref 0–53)
Alkaline Phosphatase: 42 U/L (ref 39–117)
BUN: 35 mg/dL — ABNORMAL HIGH (ref 6–23)
CO2: 32 mEq/L (ref 19–32)
Calcium: 8.9 mg/dL (ref 8.4–10.5)
GFR calc Af Amer: 74 mL/min — ABNORMAL LOW (ref >90)
GFR calc non Af Amer: 64 mL/min — ABNORMAL LOW (ref >90)
Glucose, Bld: 103 mg/dL — ABNORMAL HIGH (ref 70–99)
Potassium: 3.8 mEq/L (ref 3.5–5.1)
Sodium: 141 mEq/L (ref 135–145)
Total Protein: 5.4 g/dL — ABNORMAL LOW (ref 6.0–8.3)

## 2012-12-13 LAB — GLUCOSE, CAPILLARY
Glucose-Capillary: 124 mg/dL — ABNORMAL HIGH (ref 70–99)
Glucose-Capillary: 96 mg/dL (ref 70–99)
Glucose-Capillary: 108 mg/dL — ABNORMAL HIGH (ref 70–99)
Glucose-Capillary: 167 mg/dL — ABNORMAL HIGH (ref 70–99)

## 2012-12-13 LAB — CBC
HCT: 35.8 % — ABNORMAL LOW (ref 39.0–52.0)
Hemoglobin: 11.8 g/dL — ABNORMAL LOW (ref 13.0–17.0)
MCH: 27.8 pg (ref 26.0–34.0)
MCHC: 33 g/dL (ref 30.0–36.0)
RBC: 4.24 MIL/uL (ref 4.22–5.81)

## 2012-12-13 LAB — LIPID PANEL
Total CHOL/HDL Ratio: 2.4 RATIO
VLDL: 13 mg/dL (ref 0–40)

## 2012-12-13 LAB — HEPATIC FUNCTION PANEL
Bilirubin, Direct: <0.1 mg/dL (ref 0.0–0.3)
Total Bilirubin: 0.2 mg/dL — ABNORMAL LOW (ref 0.3–1.2)

## 2012-12-13 LAB — URINALYSIS, ROUTINE W REFLEX MICROSCOPIC
Nitrite: NEGATIVE
Protein, ur: NEGATIVE mg/dL
Specific Gravity, Urine: 1.016 (ref 1.005–1.030)
Urobilinogen, UA: 1 mg/dL (ref 0.0–1.0)

## 2012-12-13 LAB — GASTRIC OCCULT BLOOD (1-CARD TO LAB): Occult Blood, Gastric: POSITIVE — AB

## 2012-12-13 LAB — URINE MICROSCOPIC-ADD ON

## 2012-12-13 MED ORDER — ATORVASTATIN CALCIUM 40 MG PO TABS
40.0000 mg | ORAL_TABLET | Freq: Every day | ORAL | Status: DC
  Administered 2012-12-13 – 2012-12-14 (×2): 40 mg via ORAL
  Filled 2012-12-13 (×2): qty 1

## 2012-12-13 MED ORDER — SODIUM CHLORIDE 0.9 % IV SOLN
Freq: Once | INTRAVENOUS | Status: AC
  Administered 2012-12-13: 500 mL via INTRAVENOUS

## 2012-12-13 MED ORDER — SUCRALFATE 1 G PO TABS
1.0000 g | ORAL_TABLET | Freq: Three times a day (TID) | ORAL | Status: DC
  Administered 2012-12-13 – 2012-12-14 (×4): 1 g via ORAL
  Filled 2012-12-13 (×7): qty 1

## 2012-12-13 MED ORDER — FERROUS SULFATE 325 (65 FE) MG PO TABS
325.0000 mg | ORAL_TABLET | Freq: Two times a day (BID) | ORAL | Status: DC
  Administered 2012-12-13 – 2012-12-14 (×3): 325 mg via ORAL
  Filled 2012-12-13 (×4): qty 1

## 2012-12-13 MED ORDER — DOCUSATE SODIUM 100 MG PO CAPS
100.0000 mg | ORAL_CAPSULE | Freq: Two times a day (BID) | ORAL | Status: DC
  Administered 2012-12-13 – 2012-12-14 (×3): 100 mg via ORAL
  Filled 2012-12-13 (×4): qty 1

## 2012-12-13 MED ORDER — ONDANSETRON HCL 4 MG/2ML IJ SOLN: 4.0000 mg | Freq: Four times a day (QID) | INTRAMUSCULAR | Status: DC | PRN

## 2012-12-13 MED ORDER — HYDRALAZINE HCL 10 MG PO TABS
10.0000 mg | ORAL_TABLET | Freq: Three times a day (TID) | ORAL | Status: DC
  Administered 2012-12-13 – 2012-12-14 (×4): 10 mg via ORAL
  Filled 2012-12-13 (×6): qty 1

## 2012-12-13 MED ORDER — SUCRALFATE 1 G PO TABS
1.0000 g | ORAL_TABLET | Freq: Four times a day (QID) | ORAL | Status: DC
  Filled 2012-12-13 (×4): qty 1

## 2012-12-13 MED ORDER — PANTOPRAZOLE SODIUM 40 MG IV SOLR
40.0000 mg | Freq: Two times a day (BID) | INTRAVENOUS | Status: DC
  Administered 2012-12-13 – 2012-12-14 (×3): 40 mg via INTRAVENOUS
  Filled 2012-12-13 (×4): qty 40

## 2012-12-13 MED ORDER — ACETAMINOPHEN 325 MG PO TABS: 650.0000 mg | ORAL_TABLET | Freq: Four times a day (QID) | ORAL | Status: DC | PRN

## 2012-12-13 MED ORDER — SODIUM CHLORIDE 0.9 % IV SOLN
INTRAVENOUS | Status: AC
  Administered 2012-12-13 (×2): via INTRAVENOUS

## 2012-12-13 MED ORDER — AMLODIPINE BESYLATE 10 MG PO TABS
10.0000 mg | ORAL_TABLET | Freq: Every day | ORAL | Status: DC
Start: 2012-12-13 — End: 2012-12-15
  Administered 2012-12-13 – 2012-12-14 (×2): 10 mg via ORAL
  Filled 2012-12-13 (×2): qty 1

## 2012-12-13 MED ORDER — PANTOPRAZOLE SODIUM 40 MG IV SOLR
40.0000 mg | Freq: Once | INTRAVENOUS | Status: AC
  Administered 2012-12-13: 40 mg via INTRAVENOUS
  Filled 2012-12-13: qty 40

## 2012-12-13 MED ORDER — LATANOPROST 0.005 % OP SOLN
1.0000 [drp] | Freq: Every day | OPHTHALMIC | Status: DC
  Administered 2012-12-13: 1 [drp] via OPHTHALMIC
  Filled 2012-12-13: qty 2.5

## 2012-12-13 MED ORDER — CARVEDILOL 25 MG PO TABS
25.0000 mg | ORAL_TABLET | Freq: Two times a day (BID) | ORAL | Status: DC
  Administered 2012-12-13 – 2012-12-14 (×4): 25 mg via ORAL
  Filled 2012-12-13 (×5): qty 1

## 2012-12-13 MED ORDER — INSULIN ASPART 100 UNIT/ML ~~LOC~~ SOLN
0.0000 [IU] | SUBCUTANEOUS | Status: DC
  Administered 2012-12-13: 2 [IU] via SUBCUTANEOUS

## 2012-12-13 MED ORDER — LOSARTAN POTASSIUM 50 MG PO TABS
100.0000 mg | ORAL_TABLET | Freq: Every day | ORAL | Status: DC
  Administered 2012-12-13 – 2012-12-14 (×2): 100 mg via ORAL
  Filled 2012-12-13 (×2): qty 2

## 2012-12-13 MED ORDER — SUCRALFATE 1 GM/10ML PO SUSP
1.0000 g | Freq: Four times a day (QID) | ORAL | Status: DC
  Filled 2012-12-13 (×4): qty 10

## 2012-12-13 MED ORDER — ACETAMINOPHEN 650 MG RE SUPP: 650.0000 mg | Freq: Four times a day (QID) | RECTAL | Status: DC | PRN

## 2012-12-13 MED ORDER — ONDANSETRON HCL 4 MG PO TABS: 4.0000 mg | ORAL_TABLET | Freq: Four times a day (QID) | ORAL | Status: DC | PRN

## 2012-12-13 NOTE — ED Notes (Signed)
Report to RN with [facility] 

## 2012-12-13 NOTE — Consult Note (Signed)
[facility]astroenterology Consultation Note  Referring Provider: Dr. Carolyne Littles [facility])  Reason for Consultation:  Coffee ground emesis  HPI: Jose Kennedy is a 79 y.o. male seen at [facility] for weakness and palpitations.  He was then noted to have coffee ground emesis, which is the reason prompting his admission.  He has no odynophagia, abdominal pain, blood in stool.  He has had some coffee ground emesis.  No bright red hematemesis.  Has chronic GERD, and is on Zegerid and sucralfate for this.  Had similar, but more severe, episode of coffee ground emesis in May 2013, and had endoscopy by Dr. Evette Cristal showing esophagitis (as well as chronic hyperplastic polyps in proximal stomach).   Past Medical History  Diagnosis Date  . Stroke   . Coronary artery disease   . Hypertension   . GERD (gastroesophageal reflux disease)   . History of stomach ulcers   . Diabetes mellitus     insulin dependent    Past Surgical History  Procedure Laterality Date  . Laparoscopic cholecystectomy    . Back surgery    . Thyroid nodual    . Appendectomy    . Esophagogastroduodenoscopy  08/26/2011    Procedure: ESOPHAGOGASTRODUODENOSCOPY (EGD);  Surgeon: Graylin Shiver, MD;  Location: [facility] ENDOSCOPY;  Service: Endoscopy;  Laterality: N/A;    Prior to Admission medications   Medication Sig Start Date End Date Taking? Authorizing Provider  amLODipine (NORVASC) 10 MG tablet Take 10 mg by mouth daily.   Yes Historical Provider, MD  aspirin 81 MG tablet Take 81 mg by mouth daily.   Yes Historical Provider, MD  carvedilol (COREG) 25 MG tablet Take 25 mg by mouth 2 (two) times daily with a meal.   Yes Historical Provider, MD  carvedilol (COREG) 25 MG tablet Take 1 tablet (25 mg total) by mouth 2 (two) times daily with a meal. 08/27/11  Yes Army Chaco, NP  docusate sodium (COLACE) 100 MG capsule Take 100 mg by mouth 2 (two) times daily.   Yes Historical Provider, MD  ferrous sulfate 325 (65 FE) MG tablet  Take 325 mg by mouth 2 (two) times daily.   Yes Historical Provider, MD  hydrALAZINE (APRESOLINE) 10 MG tablet Take 10 mg by mouth 3 (three) times daily.   Yes Historical Provider, MD  hydrochlorothiazide (HYDRODIURIL) 25 MG tablet Take 25 mg by mouth daily.   Yes Historical Provider, MD  insulin NPH-insulin regular (NOVOLIN 70/30) (70-30) 100 UNIT/ML injection Inject 12 Units into the skin 2 (two) times daily with a meal.   Yes Historical Provider, MD  latanoprost (XALATAN) 0.005 % ophthalmic solution Place 1 drop into both eyes at bedtime.   Yes Historical Provider, MD  losartan (COZAAR) 100 MG tablet Take 100 mg by mouth daily.   Yes Historical Provider, MD  Multiple Vitamins-Minerals (CENTRUM SILVER PO) Take 1 tablet by mouth daily.   Yes Historical Provider, MD  simvastatin (ZOCOR) 80 MG tablet Take 80 mg by mouth at bedtime.   Yes Historical Provider, MD  sucralfate (CARAFATE) 1 G tablet Take 1 tablet (1 g total) by mouth 4 (four) times daily. 08/27/11  Yes Army Chaco, NP  omeprazole-sodium bicarbonate (ZEGERID) 40-1100 MG per capsule Take 1 capsule by mouth daily before breakfast. 08/27/11 09/10/11  Army Chaco, NP    Current Facility-Administered Medications  Medication Dose Route Frequency Provider Last Rate Last Dose  . 0.9 %  sodium chloride infusion   Intravenous Continuous Arshad N  Toniann Fail, MD 75 mL/hr at 12/13/12 0630    . acetaminophen (TYLENOL) tablet 650 mg  650 mg Oral Q6H PRN Eduard Clos, MD       Or  . acetaminophen (TYLENOL) suppository 650 mg  650 mg Rectal Q6H PRN Eduard Clos, MD      . amLODipine (NORVASC) tablet 10 mg  10 mg Oral Daily Eduard Clos, MD      . atorvastatin (LIPITOR) tablet 40 mg  40 mg Oral q1800 Eduard Clos, MD      . carvedilol (COREG) tablet 25 mg  25 mg Oral BID WC Eduard Clos, MD   25 mg at 12/13/12 0856  . docusate sodium (COLACE) capsule 100 mg  100 mg Oral BID Eduard Clos, MD      .  ferrous sulfate tablet 325 mg  325 mg Oral BID Eduard Clos, MD      . hydrALAZINE (APRESOLINE) tablet 10 mg  10 mg Oral TID Eduard Clos, MD      . insulin aspart (novoLOG) injection 0-9 Units  0-9 Units Subcutaneous Q4H Drema Dallas, MD      . latanoprost (XALATAN) 0.005 % ophthalmic solution 1 drop  1 drop Both Eyes QHS Eduard Clos, MD      . losartan (COZAAR) tablet 100 mg  100 mg Oral Daily Eduard Clos, MD      . ondansetron [facility]) tablet 4 mg  4 mg Oral Q6H PRN Eduard Clos, MD       Or  . ondansetron (ZOFRAN) injection 4 mg  4 mg Intravenous Q6H PRN Eduard Clos, MD      . pantoprazole (PROTONIX) injection 40 mg  40 mg Intravenous Q12H Eduard Clos, MD      . sucralfate (CARAFATE) tablet 1 g  1 g Oral QID Eduard Clos, MD        Allergies as of 12/12/2012  . (No Known Allergies)    History reviewed. No pertinent family history.  History   Social History  . Marital Status: Widowed    Spouse Name: N/A    Number of Children: N/A  . Years of Education: N/A   Occupational History  . Not on file.   Social History Main Topics  . Smoking status: Former Games developer  . Smokeless tobacco: Never Used  . Alcohol Use: No  . Drug Use: No  . Sexual Activity: Not Currently   Other Topics Concern  . Not on file   Social History Narrative   Lives with daughter    Review of Systems: As per HPI; all others negative  Physical Exam: Vital signs in last 24 hours: Temp:  [99 F (37.2 C)-99.3 F (37.4 C)] 99.2 F (37.3 C) (08/19 0929) Pulse Rate:  [68-73] 69 (08/19 0929) Resp:  [17-18] 17 (08/19 0929) BP: (129-159)/(53-96) 130/74 mmHg (08/19 0929) SpO2:  [97 %-100 %] 99 % (08/19 0929) Weight:  [81.647 kg (180 lb)-82.668 kg (182 lb 4 oz)] 82.668 kg (182 lb 4 oz) (08/19 0405) Last BM Date: 12/12/12 General:   Alert,  Well-developed, well-nourished, pleasant and cooperative in NAD Head:  Normocephalic and atraumatic. Eyes:   Sclera clear, no icterus.   Conjunctiva pink. Ears:  Normal auditory acuity. Nose:  No deformity, discharge,  or lesions. Mouth:  Edentulous, No deformity or lesions.  Oropharynx pink but somewhat dry. Neck:  Supple; no masses or thyromegaly. Lungs:  Clear throughout to auscultation.  No wheezes, crackles, or rhonchi. No acute distress. Heart:  Regular rate and rhythm; no murmurs, clicks, rubs,  or gallops. Abdomen:  Soft, nontender and nondistended. No masses, hepatosplenomegaly or hernias noted. Normal bowel sounds, without guarding, and without rebound.     Msk:  Symmetrical without gross deformities. Normal posture. Pulses:  Normal pulses noted. Extremities:  Without clubbing or edema. Neurologic:  Alert and  oriented x4;  grossly normal neurologically. Skin:  Intact without significant lesions or rashes. Cervical Nodes:  No significant cervical adenopathy. Psych:  Alert and cooperative. Normal mood and affect.   Lab Results:  Recent Labs  12/12/12 2207 12/13/12 0834  WBC 5.4 6.5  HGB 12.6* 11.8*  HCT 39.4 35.8*  PLT 190 188   BMET  Recent Labs  12/12/12 2207  NA 137  K 3.7  CL 96  CO2 35*  GLUCOSE 181*  BUN 36*  CREATININE 1.40*  CALCIUM 9.7   LFT  Recent Labs  12/13/12 0002  PROT 6.5  ALBUMIN 3.4*  AST 27  ALT 33  ALKPHOS 44  BILITOT 0.2*  BILIDIR <0.1  IBILI NOT CALCULATED   PT/INR No results found for this basename: LABPROT, INR,  in the last 72 hours  Studies/Results: Dg Chest 2 View  12/12/2012   *RADIOLOGY REPORT*  Clinical Data: Chest pain.  Palpitations.  CHEST - 2 VIEW  Comparison: 10/27/2009  Findings: Heart size and vascularity are normal and the lungs are clear.  There is calcification and tortuosity of the thoracic aorta.  No acute osseous abnormality.  IMPRESSION: No acute disease.   Original Report Authenticated By: Francene Boyers, M.D.   Impression:  1.  Coffee ground emesis.  Similar presentation one year ago, where esophagitis  was seen.  Esophagitis again suspected now.  Plan:  1.  IV PPI today, transition to oral PPI tomorrow. 2.  Carafate suspension for 6 weeks, then transition back to tablet formulation. 3.  Head of bed elevated to 45 degrees. 4.  Small, frequent meals. 5.  Do not see need for endoscopy at this time. 6.  Case discussed with patient's daughter, Jasmine December, who is in agreement with above plan. 7.  Will sign-off; please call with questions; thank you for the consult.   LOS: 1 day   Enio Hornback M  12/13/2012, 10:16 AM

## 2012-12-13 NOTE — ED Notes (Signed)
Report called to Vickie,RN on 6E

## 2012-12-13 NOTE — ED Provider Notes (Signed)
Medical screening examination/treatment/procedure(s) were conducted as a shared visit with non-physician practitioner(s) and myself.  I personally evaluated the patient during the encounter  Pt presents w/ several weeks to months of irregular heart beat, R sided CP.  VSS, pt in NAD.  Occasional premature beat on monitor. During course of stay, pt also developed hematemesis and has hx of esophogeal/pyloric ulceration. .  Trop neg, BUN elevated.  Protonix given.  Pt transferred to [facility] for upper GI bleed.   Shanna Cisco, MD 12/13/12 [id number]

## 2012-12-13 NOTE — Progress Notes (Signed)
PENDING ACCEPTANCE TRANFER NOTE:  Call received from:   Ms Rubin Payor PA.  REASON FOR REQUESTING TRANSFER: Coffee ground emesis.  HPI:  79 year old male presents with palpitation, about to be discharged as work up unremarkable, but had one episode of coffee ground emesis,  Hb of 12 grams per DL, hemodynamically stable.  He had similar complaint one year ago.   PLAN:  According to telephone report, this patient was accepted for transfer to telmetry   Under TRH team: [facility] 10,  I have requested an order be written to call Flow Manager at [phone] upon patient arrival to the floor for final physician assignment who will do the admission and give admitting orders.  SIGNEDHouston Siren, MD [facility]  12/13/2012, 12:20 AM

## 2012-12-13 NOTE — Progress Notes (Addendum)
[facility] PROGRESS NOTE  [name] ZOX:[id number] DOB: [birth year] DOA: 12/12/2012 PCP: No primary provider on file.  Assessment/Plan: 1. GI bleed; patient hemodynamically stable --Contacted [facility] for a GI consult -- currently on Protonix 40 mg twice a day   2. HTN; controlled maintain current med regimen  3. DM; obtain hemoglobin A1c, lipid panel, will maintain patient's glucose control on SSI sensitive  4. renal insufficiency; Cr slightly above his baseline which appears to run 1.10-1.19 --Control HTN/DM, Hydrate  5, Hx CVA; patient unable to be on any anticoagulants secondary to #1   Code Status: Full    Consultants: [facility] Kennedy physicians (GI)  Procedures:  Dr Jose Kennedy [facility] physicians) on 08/26/2011 performed EGD stomach showed scattered small fleshy appearing antral and  gastric body polyps which appear completely benign. On  retroflexion there was a pedunculated polyp in the cardia which  was reddish in color and on a very thick stock and there also was  a reddish polyp noted at the base of the stalk. Biopsy was  obtained of the polyp in the cardia for histology. Of note is that  this polyp has been present in the past, and found to be benign,  and the patient was told just to leave it alone. This prior  evaluation was done at [facility]. 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 Presented to the ER with complaints of right shoulder pain and skipping heartbeat. Patient also has been having some nausea.In the ER chest x-ray EKG and cardiac markers were unremarkable. Patient was about to be sent home and patient had one episode of Hematemesis. Stool for occult blood was positive.  Patient's hemoglobin was stable and patient was hemodynamically stable. Patient had similar episode last year in May when patient had EGD which showed esophagitis and was discharged on PPI. Patient has been admitted for further management. Patient denies any abdominal pain fever chills diarrhea or any shortness of breath. Patient was last seen by Dr. Wandalee Ferdinand (gastroenterologist) from [facility] physicians on 08/26/2011 for same problem where an EGD was performed; TODAY (-) Abd pain, (-) N/V/S, (-) Hematemesis    Objective: Filed Vitals:   12/13/12 0245 12/13/12 0300 12/13/12 0405 12/13/12 0929  BP:  141/78 148/53 130/74  Pulse: 70 73 68 69  Temp:   99.3 F (37.4 C) 99.2 F (37.3 C)  TempSrc:   Oral   Resp:   18 17  Height:   5\' 11"  (1.803 m)   Weight:   82.668 kg (182 lb 4 oz)   SpO2: 100% 99% 97% 99%    Intake/Output Summary (Last 24 hours) at 12/13/12 0956 Last data filed at 12/13/12 0929  Gross per 24 hour  Intake      0 ml  Output   1085 ml  Net  -1085 ml   Filed Weights   12/12/12 2123 12/13/12 0405  Weight: 81.647 kg (180 lb) 82.668 kg (182 lb 4 oz)    Exam:   General: Alert, NAD  Cardiovascular:RRR, (-) M/R/G,DP/PT pulse 2+ bilat  Respiratory: CTA bilat  Abdomen: soft, NT/ND, (+)  BS    Data Reviewed: Basic Metabolic Panel:  Recent Labs Lab 12/12/12 2207  NA 137  K 3.7  CL 96  CO2 35*  GLUCOSE 181*  BUN 36*  CREATININE 1.40*  CALCIUM 9.7   Liver Function Tests:  Recent Labs Lab 12/13/12 0002  AST 27  ALT 33  ALKPHOS 44  BILITOT 0.2*  PROT 6.5  ALBUMIN 3.4*   No results found for this basename: LIPASE, AMYLASE,  in the last 168 hours No results found for this basename: AMMONIA,  in the last 168 hours CBC:  Recent Labs Lab 12/12/12 2207 12/13/12 0834  WBC 5.4 6.5  NEUTROABS 2.6  --   HGB 12.6* 11.8*  HCT 39.4 35.8*  MCV 86.2 84.4  PLT 190 188   Cardiac Enzymes:  Recent Labs Lab 12/12/12 2207  TROPONINI <0.30   BNP (last 3  results) No results found for this basename: PROBNP,  in the last 8760 hours CBG:  Recent Labs Lab 12/13/12 0354 12/13/12 0719  GLUCAP 124* 96    No results found for this or any previous visit (from the past 240 hour(s)).   Studies: Dg Chest 2 View  12/12/2012   *RADIOLOGY REPORT*  Clinical Data: Chest pain.  Palpitations.  CHEST - 2 VIEW  Comparison: 10/27/2009  Findings: Heart size and vascularity are normal and the lungs are clear.  There is calcification and tortuosity of the thoracic aorta.  No acute osseous abnormality.  IMPRESSION: No acute disease.   Original Report Authenticated By: Jose Kennedy, M.D.    Scheduled Meds: . amLODipine  10 mg Oral Daily  . atorvastatin  40 mg Oral q1800  . carvedilol  25 mg Oral BID WC  . docusate sodium  100 mg Oral BID  . ferrous sulfate  325 mg Oral BID  . hydrALAZINE  10 mg Oral TID  . latanoprost  1 drop Both Eyes QHS  . losartan  100 mg Oral Daily  . pantoprazole (PROTONIX) IV  40 mg Intravenous Q12H  . sucralfate  1 g Oral QID   Continuous Infusions: . sodium chloride 75 mL/hr at 12/13/12 0630    Principal Problem:   GI bleed Active Problems:   Stroke   Hypertension   Hematemesis   Renal insufficiency   Diabetes mellitus    Time spent: 35 minutes    Jose Kennedy  [facility] Pager [phone]. If 7PM-7AM, please contact night-coverage at [email or web address], password [email or web address] 12/13/2012, 9:56 AM  LOS: 1 day

## 2012-12-13 NOTE — H&P (Signed)
[facility] History and Physical  Oather Muilenburg ZOX:[id number] DOB: 1928-04-06 DOA: 12/12/2012  Referring physician: ER physician. PCP: No primary provider on file. is in [place].  Chief Complaint:  Hematemesis.  HPI: Jose Kennedy is a 79 y.o. male history of hypertension hyperlipidemia diabetes mellitus type 2 presented to the ER with complaints of right shoulder pain and skin heartbeat. Patient also has been having some nausea.In the ER chest x-ray EKG and cardiac markers were unremarkable. Patient was about to be sent home and patient had one episode of hematemesis. Stool for occult blood was positive. Patient's hemoglobin was stable and patient was hemodynamically stable. Patient had similar episode last year in May when patient had EGD which showed esophagitis and was discharged on PPI. Patient has been admitted for further management. Patient denies any abdominal pain fever chills diarrhea or any shortness of breath.   Review of Systems: As presented in the history of presenting illness, rest negative.  Past Medical History  Diagnosis Date  . Stroke   . Coronary artery disease   . Hypertension   . GERD (gastroesophageal reflux disease)   . History of stomach ulcers   . Diabetes mellitus     insulin dependent   Past Surgical History  Procedure Laterality Date  . Laparoscopic cholecystectomy    . Back surgery    . Thyroid nodual    . Appendectomy    . Esophagogastroduodenoscopy  08/26/2011    Procedure: ESOPHAGOGASTRODUODENOSCOPY (EGD);  Surgeon: Graylin Shiver, MD;  Location: [facility] ENDOSCOPY;  Service: Endoscopy;  Laterality: N/A;   Social History:  reports that he has quit smoking. He has never used smokeless tobacco. He reports that he does not drink alcohol or use illicit drugs.  home where does patient live--  can do ADLs.  Can patient participate in ADLs?  No Known Allergies  History reviewed. No pertinent family history.    Prior to Admission medications    Medication Sig Start Date End Date Taking? Authorizing Provider  amLODipine (NORVASC) 10 MG tablet Take 10 mg by mouth daily.   Yes Historical Provider, MD  aspirin 81 MG tablet Take 81 mg by mouth daily.   Yes Historical Provider, MD  carvedilol (COREG) 25 MG tablet Take 25 mg by mouth 2 (two) times daily with a meal.   Yes Historical Provider, MD  carvedilol (COREG) 25 MG tablet Take 1 tablet (25 mg total) by mouth 2 (two) times daily with a meal. 08/27/11  Yes Army Chaco, NP  docusate sodium (COLACE) 100 MG capsule Take 100 mg by mouth 2 (two) times daily.   Yes Historical Provider, MD  ferrous sulfate 325 (65 FE) MG tablet Take 325 mg by mouth 2 (two) times daily.   Yes Historical Provider, MD  hydrALAZINE (APRESOLINE) 10 MG tablet Take 10 mg by mouth 3 (three) times daily.   Yes Historical Provider, MD  hydrochlorothiazide (HYDRODIURIL) 25 MG tablet Take 25 mg by mouth daily.   Yes Historical Provider, MD  insulin NPH-insulin regular (NOVOLIN 70/30) (70-30) 100 UNIT/ML injection Inject 12 Units into the skin 2 (two) times daily with a meal.   Yes Historical Provider, MD  latanoprost (XALATAN) 0.005 % ophthalmic solution Place 1 drop into both eyes at bedtime.   Yes Historical Provider, MD  losartan (COZAAR) 100 MG tablet Take 100 mg by mouth daily.   Yes Historical Provider, MD  Multiple Vitamins-Minerals (CENTRUM SILVER PO) Take 1 tablet by mouth daily.   Yes Historical Provider, MD  simvastatin (ZOCOR) 80 MG tablet Take 80 mg by mouth at bedtime.   Yes Historical Provider, MD  sucralfate (CARAFATE) 1 G tablet Take 1 tablet (1 g total) by mouth 4 (four) times daily. 08/27/11  Yes Army Chaco, NP  omeprazole-sodium bicarbonate (ZEGERID) 40-1100 MG per capsule Take 1 capsule by mouth daily before breakfast. 08/27/11 09/10/11  Army Chaco, NP   Physical Exam: Filed Vitals:   12/13/12 0230 12/13/12 0245 12/13/12 0300 12/13/12 0405  BP: 129/70  141/78 148/53  Pulse: 68 70 73 68   Temp:    99.3 F (37.4 C)  TempSrc:    Oral  Resp:    18  Height:    5\' 11"  (1.803 m)  Weight:    82.668 kg (182 lb 4 oz)  SpO2: 100% 100% 99% 97%     General:   well-built well-nourished.  Eyes:  anicteric no pallor.   ENT:  no discharge from the ears eyes nose or mouth.  Neck:  no mass felt.   Cardiovascular:  S1-S2 heard.  Respiratory: No rhonchi or crepitations.   Abdomen:  soft nontender bowel sounds present.   Skin: No rash.   Musculoskeletal:  no edema.   Psychiatric:  appears normal.  Neurologic: Alert awake oriented to time place and person. Moves all extremities.   Labs on Admission:  Basic Metabolic Panel:  Recent Labs Lab 12/12/12 2207  NA 137  K 3.7  CL 96  CO2 35*  GLUCOSE 181*  BUN 36*  CREATININE 1.40*  CALCIUM 9.7   Liver Function Tests:  Recent Labs Lab 12/13/12 0002  AST 27  ALT 33  ALKPHOS 44  BILITOT 0.2*  PROT 6.5  ALBUMIN 3.4*   No results found for this basename: LIPASE, AMYLASE,  in the last 168 hours No results found for this basename: AMMONIA,  in the last 168 hours CBC:  Recent Labs Lab 12/12/12 2207  WBC 5.4  NEUTROABS 2.6  HGB 12.6*  HCT 39.4  MCV 86.2  PLT 190   Cardiac Enzymes:  Recent Labs Lab 12/12/12 2207  TROPONINI <0.30    BNP (last 3 results) No results found for this basename: PROBNP,  in the last 8760 hours CBG:  Recent Labs Lab 12/13/12 0354  GLUCAP 124*    Radiological Exams on Admission: Dg Chest 2 View  12/12/2012   *RADIOLOGY REPORT*  Clinical Data: Chest pain.  Palpitations.  CHEST - 2 VIEW  Comparison: 10/27/2009  Findings: Heart size and vascularity are normal and the lungs are clear.  There is calcification and tortuosity of the thoracic aorta.  No acute osseous abnormality.  IMPRESSION: No acute disease.   Original Report Authenticated By: Francene Boyers, M.D.    EKG: Independently reviewed.  normal sinus rhythm.   Assessment/Plan Principal Problem:    Hematemesis Active Problems:   Hypertension   Renal insufficiency   Diabetes mellitus   1. Hematemesis  - patient has been kept n.p.o. except medications. Follow CBC. Protonix I.V. Patient had EGD last year which showed esophagitis.  2. Acute renal failure  - probably from dehydration. Will hold diuretics for now and if there is any further worsening of creatinine that may hold ARB. Gently hydrate and closely follow intake output.  3. Hypertension - continue home medications. Seen #2. 4. Diabetes mellitus type 2 - since patient is n.p.o. patient has been placed on sliding-scale coverage for now.    Code Status:  full code.  Family Communication: Patient's daughter at  the bedside.  Disposition Plan:  admit to inpatient.     Duke Weisensel N. [facility] Pager [phone].   If 7PM-7AM, please contact night-coverage [email or web address] Password [email or web address] 12/13/2012, 5:15 AM

## 2012-12-14 DIAGNOSIS — K219 Gastro-esophageal reflux disease without esophagitis: Secondary | ICD-10-CM

## 2012-12-14 LAB — GLUCOSE, CAPILLARY
Glucose-Capillary: 123 mg/dL — ABNORMAL HIGH (ref 70–99)
Glucose-Capillary: 144 mg/dL — ABNORMAL HIGH (ref 70–99)

## 2012-12-14 MED ORDER — SUCRALFATE 1 GM/10ML PO SUSP: 1.0000 g | Freq: Four times a day (QID) | ORAL | Status: DC

## 2012-12-14 MED ORDER — PANTOPRAZOLE SODIUM 40 MG PO TBEC: 40.0000 mg | DELAYED_RELEASE_TABLET | Freq: Every day | ORAL | Status: DC

## 2012-12-14 MED ORDER — SUCRALFATE 1 GM/10ML PO SUSP
1.0000 g | Freq: Three times a day (TID) | ORAL | Status: DC
  Administered 2012-12-14: 1 g via ORAL
  Filled 2012-12-14 (×3): qty 10

## 2012-12-14 NOTE — Progress Notes (Signed)
Jose Kennedy discharged Home per MD order.  Discharge instructions reviewed and discussed with the patient and Jose Kennedy (daughter), all questions and concerns answered. Copy of instructions and scripts given to patient and daughter.  Patients skin is clean, dry and intact, no evidence of skin break down. IV site discontinued and catheter remains intact. Site without signs and symptoms of complications. Dressing and pressure applied.  Patient escorted to car by RN in a wheelchair,  no distress noted upon discharge.  Gilman Schmidt 12/14/2012 9:31 PM

## 2012-12-14 NOTE — Discharge Summary (Signed)
Physician Discharge Summary  Jose Kennedy WUJ:[id number] DOB: [birth year] DOA: 12/12/2012  PCP: No primary provider on file.  Admit date: 12/12/2012 Discharge date: 12/14/2012  Time spent: 30 minutes  Recommendations for Outpatient Follow-up:  1. Follow up with PCP in 1-2 weeks 2. Carafate suspension x 6 weeks, then resume tablet form  Discharge Diagnoses:  Principal Problem:   GI bleed Active Problems:   Stroke   Hypertension   Hematemesis   Renal insufficiency   Diabetes mellitus   Discharge Condition: Stable  Diet recommendation: Regular  Filed Weights   12/12/12 2123 12/13/12 0405 12/13/12 2012  Weight: 81.647 kg (180 lb) 82.668 kg (182 lb 4 oz) 82.668 kg (182 lb 4 oz)    History of present illness:  Jose Kennedy is a 79 y.o. male history of hypertension hyperlipidemia diabetes mellitus type 2 presented to the ER with complaints of right shoulder pain and skin heartbeat. Patient also has been having some nausea.In the ER chest x-ray EKG and cardiac markers were unremarkable. Patient was about to be sent home and patient had one episode of hematemesis. Stool for occult blood was positive. Patient's hemoglobin was stable and patient was hemodynamically stable. Patient had similar episode last year in May when patient had EGD which showed esophagitis and was discharged on PPI. Patient has been admitted for further management. Patient denies any abdominal pain fever chills diarrhea or any shortness of breath.   Hospital Course:  The patient was admitted to the floor. Blood counts remained stable without requiring transfusion. Gastroenterology was consulted. Recommendations for GI were for carafate suspension x 6 weeks then resuming tablet formulation, small frequent meals, and IV PPI with transition to oral form by the day of discharge. No endoscopy was recommended.  Consultations:  Gastroenterology  Discharge Exam: Filed Vitals:   12/13/12 1347 12/13/12 1615 12/13/12 2012  12/14/12 0417  BP: 139/78 149/78 128/60 148/88  Pulse: 67 67 64 63  Temp: 98.5 F (36.9 C) 99.3 F (37.4 C) 99.3 F (37.4 C) 98.6 F (37 C)  TempSrc:   Oral Oral  Resp: 18 18 18 18   Height:   5\' 11"  (1.803 m)   Weight:   82.668 kg (182 lb 4 oz)   SpO2: 99% 100% 100% 100%    General: Awake, in nad Cardiovascular: regular, s1, s2 Respiratory: normal resp effort  Discharge Instructions     Medication List    STOP taking these medications       omeprazole-sodium bicarbonate 40-1100 MG per capsule  Commonly known as:  ZEGERID     sucralfate 1 G tablet  Commonly known as:  CARAFATE  Replaced by:  sucralfate 1 GM/10ML suspension      TAKE these medications       amLODipine 10 MG tablet  Commonly known as:  NORVASC  Take 10 mg by mouth daily.     aspirin 81 MG tablet  Take 81 mg by mouth daily.     carvedilol 25 MG tablet  Commonly known as:  COREG  Take 25 mg by mouth 2 (two) times daily with a meal.     carvedilol 25 MG tablet  Commonly known as:  COREG  Take 1 tablet (25 mg total) by mouth 2 (two) times daily with a meal.     CENTRUM SILVER PO  Take 1 tablet by mouth daily.     docusate sodium 100 MG capsule  Commonly known as:  COLACE  Take 100 mg by mouth 2 (two) times daily.  ferrous sulfate 325 (65 FE) MG tablet  Take 325 mg by mouth 2 (two) times daily.     hydrALAZINE 10 MG tablet  Commonly known as:  APRESOLINE  Take 10 mg by mouth 3 (three) times daily.     hydrochlorothiazide 25 MG tablet  Commonly known as:  HYDRODIURIL  Take 25 mg by mouth daily.     insulin NPH-regular (70-30) 100 UNIT/ML injection  Commonly known as:  NOVOLIN 70/30  Inject 12 Units into the skin 2 (two) times daily with a meal.     latanoprost 0.005 % ophthalmic solution  Commonly known as:  XALATAN  Place 1 drop into both eyes at bedtime.     losartan 100 MG tablet  Commonly known as:  COZAAR  Take 100 mg by mouth daily.     pantoprazole 40 MG tablet   Commonly known as:  PROTONIX  Take 1 tablet (40 mg total) by mouth daily.     simvastatin 80 MG tablet  Commonly known as:  ZOCOR  Take 80 mg by mouth at bedtime.     sucralfate 1 GM/10ML suspension  Commonly known as:  CARAFATE  Take 10 mL (1 g total) by mouth 4 (four) times daily.       No Known Allergies Follow-up Information   Follow up with follow up with your PCP in 1-2 weeks.       The results of significant diagnostics from this hospitalization (including imaging, microbiology, ancillary and laboratory) are listed below for reference.    Significant Diagnostic Studies: Dg Chest 2 View  12/12/2012   *RADIOLOGY REPORT*  Clinical Data: Chest pain.  Palpitations.  CHEST - 2 VIEW  Comparison: 10/27/2009  Findings: Heart size and vascularity are normal and the lungs are clear.  There is calcification and tortuosity of the thoracic aorta.  No acute osseous abnormality.  IMPRESSION: No acute disease.   Original Report Authenticated By: Francene Boyers, M.D.    Microbiology: Recent Results (from the past 240 hour(s))  URINE CULTURE     Status: None   Collection Time    12/13/12 12:20 AM      Result Value Range Status   Specimen Description URINE, CLEAN CATCH   Final   Special Requests NONE   Final   Culture  Setup Time     Final   Value: 12/13/2012 10:40     Performed at [facility] Count PENDING   Incomplete   Culture     Final   Value: Culture reincubated for better growth     Performed at [facility]   Report Status PENDING   Incomplete     Labs: Basic Metabolic Panel:  Recent Labs Lab 12/12/12 2207 12/13/12 0834  NA 137 141  K 3.7 3.8  CL 96 102  CO2 35* 32  GLUCOSE 181* 103*  BUN 36* 35*  CREATININE 1.40* 1.05  CALCIUM 9.7 8.9   Liver Function Tests:  Recent Labs Lab 12/13/12 0002 12/13/12 0834  AST 27 24  ALT 33 25  ALKPHOS 44 42  BILITOT 0.2* 0.3  PROT 6.5 5.4*  ALBUMIN 3.4* 2.9*   No results found for this  basename: LIPASE, AMYLASE,  in the last 168 hours No results found for this basename: AMMONIA,  in the last 168 hours CBC:  Recent Labs Lab 12/12/12 2207 12/13/12 0834  WBC 5.4 6.5  NEUTROABS 2.6  --   HGB 12.6* 11.8*  HCT 39.4 35.8*  MCV 86.2 84.4  PLT 190 188   Cardiac Enzymes:  Recent Labs Lab 12/12/12 2207  TROPONINI <0.30   BNP: BNP (last 3 results) No results found for this basename: PROBNP,  in the last 8760 hours CBG:  Recent Labs Lab 12/13/12 1608 12/13/12 2017 12/13/12 2345 12/14/12 0419 12/14/12 0811  GLUCAP 167* 120* 114* 108* 96   Signed:  Shaunte Weissinger K  [facility] 12/14/2012, 9:52 AM

## 2012-12-16 LAB — URINE CULTURE

## 2013-03-30 ENCOUNTER — Ambulatory Visit (INDEPENDENT_AMBULATORY_CARE_PROVIDER_SITE_OTHER): Payer: Medicare Other | Admitting: Podiatry

## 2013-03-30 ENCOUNTER — Encounter: Payer: Self-pay | Admitting: Podiatry

## 2013-03-30 DIAGNOSIS — M79609 Pain in unspecified limb: Secondary | ICD-10-CM

## 2013-03-30 DIAGNOSIS — B372 Candidiasis of skin and nail: Secondary | ICD-10-CM

## 2013-03-30 DIAGNOSIS — B351 Tinea unguium: Secondary | ICD-10-CM

## 2013-03-30 NOTE — Progress Notes (Signed)
Subjective:     Patient ID: Jose Kennedy, male   DOB: 06-22-1927, 79 y.o.   MRN: [id number]  HPI patient presents with caregiver with nail disease 1-5 of both feet that are thick and painful when pressed and also complaining of itching between the toes   Review of Systems     Objective:   Physical Exam    neurovascular status is intact with no changes in health history and severely thickened nail bed that are painful 1-5 of both feet dystrophic painful nailbeds 1-5 of both feet. Assessment:    that are painful 1-5 of both feet. Also noted no significant changes into her digital areas of both the Mycotic nails with pain 1-5 both feet and dermatitis of both feet present with  mycotic infiltration noted    Plan:     Debridement nailbeds 1-5 of both feet and advised on creams for between the toes and further treatment if itching should persist

## 2013-03-30 NOTE — Patient Instructions (Signed)
Diabetes and Foot Care Diabetes may cause you to have problems because of poor blood supply (circulation) to your feet and legs. This may cause the skin on your feet to become thinner, break easier, and heal more slowly. Your skin may become dry, and the skin may peel and crack. You may also have nerve damage in your legs and feet causing decreased feeling in them. You may not notice minor injuries to your feet that could lead to infections or more serious problems. Taking care of your feet is one of the most important things you can do for yourself.  HOME CARE INSTRUCTIONS  Wear shoes at all times, even in the house. Do not go barefoot. Bare feet are easily injured.  Check your feet daily for blisters, cuts, and redness. If you cannot see the bottom of your feet, use a mirror or ask someone for help.  Wash your feet with warm water (do not use hot water) and mild soap. Then pat your feet and the areas between your toes until they are completely dry. Do not soak your feet as this can dry your skin.  Apply a moisturizing lotion or petroleum jelly (that does not contain alcohol and is unscented) to the skin on your feet and to dry, brittle toenails. Do not apply lotion between your toes.  Trim your toenails straight across. Do not dig under them or around the cuticle. File the edges of your nails with an emery board or nail file.  Do not cut corns or calluses or try to remove them with medicine.  Wear clean socks or stockings every day. Make sure they are not too tight. Do not wear knee-high stockings since they may decrease blood flow to your legs.  Wear shoes that fit properly and have enough cushioning. To break in new shoes, wear them for just a few hours a day. This prevents you from injuring your feet. Always look in your shoes before you put them on to be sure there are no objects inside.  Do not cross your legs. This may decrease the blood flow to your feet.  If you find a minor scrape,  cut, or break in the skin on your feet, keep it and the skin around it clean and dry. These areas may be cleansed with mild soap and water. Do not cleanse the area with peroxide, alcohol, or iodine.  When you remove an adhesive bandage, be sure not to damage the skin around it.  If you have a wound, look at it several times a day to make sure it is healing.  Do not use heating pads or hot water bottles. They may burn your skin. If you have lost feeling in your feet or legs, you may not know it is happening until it is too late.  Make sure your health care provider performs a complete foot exam at least annually or more often if you have foot problems. Report any cuts, sores, or bruises to your health care provider immediately. SEEK MEDICAL CARE IF:   You have an injury that is not healing.  You have cuts or breaks in the skin.  You have an ingrown nail.  You notice redness on your legs or feet.  You feel burning or tingling in your legs or feet.  You have pain or cramps in your legs and feet.  Your legs or feet are numb.  Your feet always feel cold. SEEK IMMEDIATE MEDICAL CARE IF:   There is increasing redness,   swelling, or pain in or around a wound.  There is a red line that goes up your leg.  Pus is coming from a wound.  You develop a fever or as directed by your health care provider.  You notice a bad smell coming from an ulcer or wound. Document Released: 04/10/2000 Document Revised: 12/14/2012 Document Reviewed: 09/20/2012 [employer] Patient Information 2014 [employer].  

## 2013-06-26 ENCOUNTER — Ambulatory Visit: Payer: Medicare Other | Admitting: Podiatry

## 2013-07-03 ENCOUNTER — Encounter: Payer: Self-pay | Admitting: Podiatry

## 2013-07-03 ENCOUNTER — Ambulatory Visit (INDEPENDENT_AMBULATORY_CARE_PROVIDER_SITE_OTHER): Payer: Medicare Other | Admitting: Podiatry

## 2013-07-03 VITALS — BP 146/70 | HR 64 | Resp 12

## 2013-07-03 DIAGNOSIS — B351 Tinea unguium: Secondary | ICD-10-CM

## 2013-07-03 DIAGNOSIS — M79609 Pain in unspecified limb: Secondary | ICD-10-CM

## 2013-07-04 NOTE — Progress Notes (Signed)
Patient ID: Jose Kennedy, male   DOB: 1927/05/06, 80 y.o.   MRN: [id number]  Subjective: This patient presents for ongoing debridement of painful mycotic toenails.  Objective: Hypertrophic, elongated, discolored toenails x10  Assessment: Symptomatic onychomycoses x10  Plan: Nails x10 are debrided without any bleeding. Reappoint at three-month intervals.

## 2013-07-28 ENCOUNTER — Encounter (HOSPITAL_COMMUNITY): Payer: Self-pay | Admitting: Emergency Medicine

## 2013-07-28 ENCOUNTER — Observation Stay (HOSPITAL_COMMUNITY)
Admission: EM | Admit: 2013-07-28 | Discharge: 2013-07-30 | Disposition: A | Discharge status: Home / Self Care | Payer: Medicare Other | Attending: Internal Medicine | Admitting: Internal Medicine

## 2013-07-28 ENCOUNTER — Emergency Department (HOSPITAL_COMMUNITY): Payer: Medicare Other

## 2013-07-28 DIAGNOSIS — I493 Ventricular premature depolarization: Secondary | ICD-10-CM

## 2013-07-28 DIAGNOSIS — Z8711 Personal history of peptic ulcer disease: Secondary | ICD-10-CM | POA: Insufficient documentation

## 2013-07-28 DIAGNOSIS — Z8719 Personal history of other diseases of the digestive system: Secondary | ICD-10-CM

## 2013-07-28 DIAGNOSIS — K922 Gastrointestinal hemorrhage, unspecified: Secondary | ICD-10-CM

## 2013-07-28 DIAGNOSIS — Z8673 Personal history of transient ischemic attack (TIA), and cerebral infarction without residual deficits: Secondary | ICD-10-CM | POA: Insufficient documentation

## 2013-07-28 DIAGNOSIS — I4949 Other premature depolarization: Secondary | ICD-10-CM | POA: Insufficient documentation

## 2013-07-28 DIAGNOSIS — I129 Hypertensive chronic kidney disease with stage 1 through stage 4 chronic kidney disease, or unspecified chronic kidney disease: Secondary | ICD-10-CM | POA: Insufficient documentation

## 2013-07-28 DIAGNOSIS — Z87891 Personal history of nicotine dependence: Secondary | ICD-10-CM | POA: Insufficient documentation

## 2013-07-28 DIAGNOSIS — N183 Chronic kidney disease, stage 3 unspecified: Secondary | ICD-10-CM | POA: Insufficient documentation

## 2013-07-28 DIAGNOSIS — K920 Hematemesis: Secondary | ICD-10-CM

## 2013-07-28 DIAGNOSIS — E119 Type 2 diabetes mellitus without complications: Secondary | ICD-10-CM

## 2013-07-28 DIAGNOSIS — Z7982 Long term (current) use of aspirin: Secondary | ICD-10-CM | POA: Insufficient documentation

## 2013-07-28 DIAGNOSIS — K219 Gastro-esophageal reflux disease without esophagitis: Secondary | ICD-10-CM | POA: Insufficient documentation

## 2013-07-28 DIAGNOSIS — I440 Atrioventricular block, first degree: Secondary | ICD-10-CM | POA: Insufficient documentation

## 2013-07-28 DIAGNOSIS — I10 Essential (primary) hypertension: Secondary | ICD-10-CM

## 2013-07-28 DIAGNOSIS — I639 Cerebral infarction, unspecified: Secondary | ICD-10-CM

## 2013-07-28 DIAGNOSIS — I498 Other specified cardiac arrhythmias: Principal | ICD-10-CM | POA: Insufficient documentation

## 2013-07-28 DIAGNOSIS — I2510 Atherosclerotic heart disease of native coronary artery without angina pectoris: Secondary | ICD-10-CM

## 2013-07-28 DIAGNOSIS — Z794 Long term (current) use of insulin: Secondary | ICD-10-CM | POA: Insufficient documentation

## 2013-07-28 DIAGNOSIS — N289 Disorder of kidney and ureter, unspecified: Secondary | ICD-10-CM

## 2013-07-28 DIAGNOSIS — R001 Bradycardia, unspecified: Secondary | ICD-10-CM | POA: Diagnosis present

## 2013-07-28 LAB — COMPREHENSIVE METABOLIC PANEL
ALT: 33 U/L (ref 0–53)
AST: 30 U/L (ref 0–37)
Albumin: 3.5 g/dL (ref 3.5–5.2)
Alkaline Phosphatase: 48 U/L (ref 39–117)
BILIRUBIN TOTAL: 0.3 mg/dL (ref 0.3–1.2)
BUN: 30 mg/dL — AB (ref 6–23)
CALCIUM: 9.3 mg/dL (ref 8.4–10.5)
CO2: 30 meq/L (ref 19–32)
Chloride: 96 mEq/L (ref 96–112)
Creatinine, Ser: 1.46 mg/dL — ABNORMAL HIGH (ref 0.50–1.35)
GFR, EST AFRICAN AMERICAN: 49 mL/min — AB (ref >90)
GFR, EST NON AFRICAN AMERICAN: 43 mL/min — AB (ref >90)
Glucose, Bld: 119 mg/dL — ABNORMAL HIGH (ref 70–99)
Potassium: 4 mEq/L (ref 3.7–5.3)
Sodium: 139 mEq/L (ref 137–147)
Total Protein: 6.9 g/dL (ref 6.0–8.3)

## 2013-07-28 LAB — CBC WITH DIFFERENTIAL/PLATELET
Basophils Absolute: 0 10*3/uL (ref 0.0–0.1)
Basophils Relative: 0 % (ref 0–1)
EOS PCT: 6 % — AB (ref 0–5)
Eosinophils Absolute: 0.4 10*3/uL (ref 0.0–0.7)
HEMATOCRIT: 43.5 % (ref 39.0–52.0)
Hemoglobin: 14.2 g/dL (ref 13.0–17.0)
LYMPHS ABS: 1.8 10*3/uL (ref 0.7–4.0)
Lymphocytes Relative: 26 % (ref 12–46)
MCH: 28.2 pg (ref 26.0–34.0)
MCHC: 32.6 g/dL (ref 30.0–36.0)
MCV: 86.5 fL (ref 78.0–100.0)
MONO ABS: 0.6 10*3/uL (ref 0.1–1.0)
Monocytes Relative: 9 % (ref 3–12)
NEUTROS ABS: 4 10*3/uL (ref 1.7–7.7)
Neutrophils Relative %: 59 % (ref 43–77)
Platelets: 197 10*3/uL (ref 150–400)
RBC: 5.03 MIL/uL (ref 4.22–5.81)
RDW: 14.5 % (ref 11.5–15.5)
WBC: 6.9 10*3/uL (ref 4.0–10.5)

## 2013-07-28 LAB — I-STAT TROPONIN, ED: Troponin i, poc: 0.01 ng/mL (ref 0.00–0.08)

## 2013-07-28 NOTE — ED Notes (Signed)
Masneri, MD at bedside.  

## 2013-07-28 NOTE — ED Provider Notes (Signed)
CSN: [id number]     Arrival date & time 07/28/13  2237 History   First MD Initiated Contact with Patient 07/28/13 2302     Chief Complaint  Patient presents with  . Bradycardia     (Consider location/radiation/quality/duration/timing/severity/associated sxs/prior Treatment) HPI Comments: [name] with h/o Stoke, CAD, HTN, GERD, IDDM presents to ER with cc of bradycardia over past day and weakness and feeling out of it when his rate is low.    Hx obtained from pt, daughter and son-in-law.  Daughter is concerned about irregular heart beat.    Patient is a 80 y.o. male presenting with weakness. The history is provided by the patient and a relative.  Weakness This is a new problem. The current episode started yesterday. The problem has not changed since onset.Pertinent negatives include no chest pain, no abdominal pain, no headaches and no shortness of breath. Nothing aggravates the symptoms. Nothing relieves the symptoms. He has tried nothing for the symptoms.    Past Medical History  Diagnosis Date  . Stroke   . Coronary artery disease   . Hypertension   . GERD (gastroesophageal reflux disease)   . History of stomach ulcers   . Diabetes mellitus     insulin dependent   Past Surgical History  Procedure Laterality Date  . Laparoscopic cholecystectomy    . Back surgery    . Thyroid nodual    . Appendectomy    . Esophagogastroduodenoscopy  08/26/2011    Procedure: ESOPHAGOGASTRODUODENOSCOPY (EGD);  Surgeon: Wonda Horner, MD;  Location: [facility] ENDOSCOPY;  Service: Endoscopy;  Laterality: N/A;   No family history on file. History  Substance Use Topics  . Smoking status: Former Research scientist (life sciences)  . Smokeless tobacco: Never Used  . Alcohol Use: No    Review of Systems  Unable to perform ROS Constitutional: Negative for fever, diaphoresis, activity change, appetite change, fatigue and unexpected weight change.  HENT: Negative.   Eyes: Negative.   Respiratory: Negative.  Negative for  apnea, cough, choking, chest tightness, shortness of breath, wheezing and stridor.   Cardiovascular: Negative for chest pain.       Bradycardia  Gastrointestinal: Negative for nausea, vomiting, abdominal pain, diarrhea, constipation, blood in stool and anal bleeding.  Endocrine: Negative.   Genitourinary: Negative.   Musculoskeletal: Negative.   Neurological: Positive for weakness. Negative for dizziness, tremors, seizures, syncope, facial asymmetry, speech difficulty, light-headedness, numbness and headaches.  All other systems reviewed and are negative.      Allergies  Review of patient's allergies indicates no known allergies.  Home Medications   No current outpatient prescriptions on file. BP 144/63  Pulse 61  Temp(Src) 98.9 F (37.2 C) (Oral)  Resp 18  Ht 5\' 11"  (1.803 m)  Wt 182 lb (82.555 kg)  BMI 25.40 kg/m2  SpO2 95% Physical Exam  Nursing note and vitals reviewed. Constitutional: He is oriented to person, place, and time. He appears well-developed and well-nourished.  HENT:  Head: Normocephalic and atraumatic.  Mouth/Throat: Oropharynx is clear and moist.  Eyes: Conjunctivae are normal. Right eye exhibits no discharge. Left eye exhibits no discharge.  Neck: Normal range of motion. Neck supple.  Cardiovascular:  Occasional extrasystoles are present. Bradycardia present.  Exam reveals no distant heart sounds and no friction rub.   No murmur heard. Pulmonary/Chest: Effort normal and breath sounds normal. He has no wheezes. He has no rales.  Abdominal: Soft. Bowel sounds are normal. There is no tenderness. There is no rebound and no  guarding.  Musculoskeletal: Normal range of motion. He exhibits edema. He exhibits no tenderness.  Neurological: He is alert and oriented to person, place, and time. He has normal reflexes.    ED Course  Procedures (including critical care time) Labs Review Labs Reviewed  CBC WITH DIFFERENTIAL - Abnormal; Notable for the following:     Eosinophils Relative 6 (*)    All other components within normal limits  COMPREHENSIVE METABOLIC PANEL - Abnormal; Notable for the following:    Glucose, Bld 119 (*)    BUN 30 (*)    Creatinine, Ser 1.46 (*)    GFR calc non Af Amer 43 (*)    GFR calc Af Amer 49 (*)    All other components within normal limits  GLUCOSE, CAPILLARY - Abnormal; Notable for the following:    Glucose-Capillary 191 (*)    All other components within normal limits  HEMOGLOBIN A1C  TSH  I-STAT TROPOININ, ED   Imaging Review Dg Chest 2 View  07/29/2013   CLINICAL DATA:  Bradycardia.  EXAM: CHEST  2 VIEW  COMPARISON:  Chest radiograph performed 12/12/2012  FINDINGS: The lungs are well-aerated. Mild bilateral atelectasis is noted. There is no evidence of pleural effusion or pneumothorax.  The heart is borderline normal in size. No acute osseous abnormalities are seen. Clips are noted within the right upper quadrant, reflecting prior cholecystectomy.  IMPRESSION: Mild bilateral atelectasis noted; lungs otherwise clear.   Electronically Signed   By: Garald Balding M.D.   On: 07/29/2013 00:17     EKG Interpretation   Date/Time:  Friday July 28 2013 22:46:04 EDT Ventricular Rate:  71 PR Interval:  230 QRS Duration: 94 QT Interval:  372 QTC Calculation: 404 R Axis:   54 Text Interpretation:  Sinus rhythm with 1st degree A-V block with  occasional Premature ventricular complexes Possible Anterior infarct , age  undetermined Abnormal ECG Confirmed by [facility]  MD, Fontella Shan ([id number]) on  07/28/2013 11:03:39 PM      MDM   Final diagnoses:  Bradycardia   [name] with h/o Stroke, CAD, HTN, GERD and DM presents to ER with cc of bradycardia and weakness.  At home HR in 40s when symptoms occurred.   ER w/u negative.  No symptomatic bradycardia while at rest in exam room.  Case d/w internal medicine Dr. Alcario Drought who agrees with admission.  Pt and family members agree with plan.  Pt admitted without issue.       Elmer Sow, MD 07/29/13 [phone]

## 2013-07-28 NOTE — ED Notes (Signed)
Pt's daughter states that they have a vitals machine at home, and when the pt's vitals were checked "about an hour ago", his heart rate was reportedly 43. Pt states that at this time he felt "out of it", which he defined as feeling tired. Pt's heart rate at this time is 66.

## 2013-07-28 NOTE — ED Notes (Addendum)
Family reported that pt.'s heart rate was in the 40's yesterday and again this evening = 46 /min . With slight dizziness and generalized weakness. Denies chest pain or SOB , history of CAD.

## 2013-07-29 DIAGNOSIS — I493 Ventricular premature depolarization: Secondary | ICD-10-CM | POA: Diagnosis present

## 2013-07-29 DIAGNOSIS — N183 Chronic kidney disease, stage 3 unspecified: Secondary | ICD-10-CM

## 2013-07-29 DIAGNOSIS — I4949 Other premature depolarization: Secondary | ICD-10-CM

## 2013-07-29 DIAGNOSIS — N289 Disorder of kidney and ureter, unspecified: Secondary | ICD-10-CM

## 2013-07-29 DIAGNOSIS — I379 Nonrheumatic pulmonary valve disorder, unspecified: Secondary | ICD-10-CM

## 2013-07-29 DIAGNOSIS — K219 Gastro-esophageal reflux disease without esophagitis: Secondary | ICD-10-CM

## 2013-07-29 DIAGNOSIS — R001 Bradycardia, unspecified: Secondary | ICD-10-CM | POA: Diagnosis present

## 2013-07-29 DIAGNOSIS — E119 Type 2 diabetes mellitus without complications: Secondary | ICD-10-CM

## 2013-07-29 DIAGNOSIS — I498 Other specified cardiac arrhythmias: Secondary | ICD-10-CM

## 2013-07-29 LAB — GLUCOSE, CAPILLARY
GLUCOSE-CAPILLARY: 124 mg/dL — AB (ref 70–99)
Glucose-Capillary: 191 mg/dL — ABNORMAL HIGH (ref 70–99)
Glucose-Capillary: 209 mg/dL — ABNORMAL HIGH (ref 70–99)
Glucose-Capillary: 148 mg/dL — ABNORMAL HIGH (ref 70–99)

## 2013-07-29 MED ORDER — SUCRALFATE 1 GM/10ML PO SUSP
1.0000 g | Freq: Three times a day (TID) | ORAL | Status: DC
  Administered 2013-07-29 – 2013-07-30 (×5): 1 g via ORAL
  Filled 2013-07-29 (×10): qty 10

## 2013-07-29 MED ORDER — CARVEDILOL 12.5 MG PO TABS
12.5000 mg | ORAL_TABLET | Freq: Two times a day (BID) | ORAL | Status: DC
  Administered 2013-07-29 – 2013-07-30 (×3): 12.5 mg via ORAL
  Filled 2013-07-29 (×5): qty 1

## 2013-07-29 MED ORDER — LOSARTAN POTASSIUM 50 MG PO TABS
100.0000 mg | ORAL_TABLET | Freq: Every day | ORAL | Status: DC
  Administered 2013-07-29 – 2013-07-30 (×2): 100 mg via ORAL
  Filled 2013-07-29 (×2): qty 2

## 2013-07-29 MED ORDER — LATANOPROST 0.005 % OP SOLN
1.0000 [drp] | Freq: Every day | OPHTHALMIC | Status: DC
  Administered 2013-07-29: 1 [drp] via OPHTHALMIC
  Filled 2013-07-29: qty 2.5

## 2013-07-29 MED ORDER — SODIUM CHLORIDE 0.9 % IJ SOLN
3.0000 mL | Freq: Two times a day (BID) | INTRAMUSCULAR | Status: DC
  Administered 2013-07-29 – 2013-07-30 (×2): 3 mL via INTRAVENOUS

## 2013-07-29 MED ORDER — FERROUS SULFATE 325 (65 FE) MG PO TABS
325.0000 mg | ORAL_TABLET | Freq: Two times a day (BID) | ORAL | Status: DC
  Administered 2013-07-29 – 2013-07-30 (×3): 325 mg via ORAL
  Filled 2013-07-29 (×5): qty 1

## 2013-07-29 MED ORDER — HYDRALAZINE HCL 10 MG PO TABS
10.0000 mg | ORAL_TABLET | Freq: Three times a day (TID) | ORAL | Status: DC
  Administered 2013-07-29 – 2013-07-30 (×5): 10 mg via ORAL
  Filled 2013-07-29 (×8): qty 1

## 2013-07-29 MED ORDER — INSULIN ASPART PROT & ASPART (70-30 MIX) 100 UNIT/ML ~~LOC~~ SUSP
10.0000 [IU] | Freq: Two times a day (BID) | SUBCUTANEOUS | Status: DC
  Administered 2013-07-29 – 2013-07-30 (×3): 10 [IU] via SUBCUTANEOUS
  Filled 2013-07-29: qty 10

## 2013-07-29 MED ORDER — PANTOPRAZOLE SODIUM 40 MG PO TBEC
40.0000 mg | DELAYED_RELEASE_TABLET | Freq: Every day | ORAL | Status: DC
  Administered 2013-07-29 – 2013-07-30 (×2): 40 mg via ORAL
  Filled 2013-07-29 (×2): qty 1

## 2013-07-29 MED ORDER — INSULIN ASPART 100 UNIT/ML ~~LOC~~ SOLN
0.0000 [IU] | Freq: Three times a day (TID) | SUBCUTANEOUS | Status: DC
  Administered 2013-07-29: 2 [IU] via SUBCUTANEOUS
  Administered 2013-07-29: 1 [IU] via SUBCUTANEOUS

## 2013-07-29 MED ORDER — AMLODIPINE BESYLATE 10 MG PO TABS
10.0000 mg | ORAL_TABLET | Freq: Every day | ORAL | Status: DC
  Administered 2013-07-29 – 2013-07-30 (×2): 10 mg via ORAL
  Filled 2013-07-29 (×2): qty 1

## 2013-07-29 MED ORDER — HEPARIN SODIUM (PORCINE) 5000 UNIT/ML IJ SOLN
5000.0000 [IU] | Freq: Three times a day (TID) | INTRAMUSCULAR | Status: DC
  Administered 2013-07-29 – 2013-07-30 (×5): 5000 [IU] via SUBCUTANEOUS
  Filled 2013-07-29 (×7): qty 1

## 2013-07-29 MED ORDER — ASPIRIN EC 81 MG PO TBEC
81.0000 mg | DELAYED_RELEASE_TABLET | Freq: Every day | ORAL | Status: DC
  Administered 2013-07-29 – 2013-07-30 (×2): 81 mg via ORAL
  Filled 2013-07-29 (×2): qty 1

## 2013-07-29 MED ORDER — ATORVASTATIN CALCIUM 40 MG PO TABS
40.0000 mg | ORAL_TABLET | Freq: Every day | ORAL | Status: DC
  Administered 2013-07-29: 40 mg via ORAL
  Filled 2013-07-29 (×2): qty 1

## 2013-07-29 MED ORDER — DOCUSATE SODIUM 100 MG PO CAPS
100.0000 mg | ORAL_CAPSULE | Freq: Two times a day (BID) | ORAL | Status: DC
  Administered 2013-07-29 – 2013-07-30 (×3): 100 mg via ORAL
  Filled 2013-07-29 (×4): qty 1

## 2013-07-29 MED ORDER — HYDROCHLOROTHIAZIDE 25 MG PO TABS
25.0000 mg | ORAL_TABLET | Freq: Every day | ORAL | Status: DC
  Administered 2013-07-29: 25 mg via ORAL
  Filled 2013-07-29: qty 1

## 2013-07-29 NOTE — Progress Notes (Signed)
  Echocardiogram 2D Echocardiogram has been performed.  Alla Sloma FRANCES 07/29/2013, 12:12 PM

## 2013-07-29 NOTE — H&P (Signed)
[facility] History and Physical  Anita Mcadory ZOX:[id number] DOB: 1927/09/26 DOA: 07/28/2013  Referring physician: EDP PCP: Pcp Not In System   Chief Complaint: Bradycardia   HPI: Jose Kennedy is a 80 y.o. male who presents to the ED with cc of generalized weakness at home and bradycardia.  Specifically he has episodes over the course of the last day when he has been feeling weak and "out of it".  Conveniently the family has a heart rate monitor at home, and noticed that his heart rate was running quite low during these periods (40s).  Additionally he would feel better when his heart rate came back up towards the 50s and 60s.  They presented to the ED this evening.  Right now in the ED patient denies CP or shortness of breath and feels ok.  Not surprisingly his heart rate is currently running in the 60s to 70s while here.  Review of Systems: Systems reviewed.  As above, otherwise negative  Past Medical History  Diagnosis Date  . Stroke   . Coronary artery disease   . Hypertension   . GERD (gastroesophageal reflux disease)   . History of stomach ulcers   . Diabetes mellitus     insulin dependent   Past Surgical History  Procedure Laterality Date  . Laparoscopic cholecystectomy    . Back surgery    . Thyroid nodual    . Appendectomy    . Esophagogastroduodenoscopy  08/26/2011    Procedure: ESOPHAGOGASTRODUODENOSCOPY (EGD);  Surgeon: Wonda Horner, MD;  Location: [facility] ENDOSCOPY;  Service: Endoscopy;  Laterality: N/A;   Social History:  reports that he has quit smoking. He has never used smokeless tobacco. He reports that he does not drink alcohol or use illicit drugs.  No Known Allergies  No family history on file.   Prior to Admission medications   Medication Sig Start Date End Date Taking? Authorizing Provider  amLODipine (NORVASC) 10 MG tablet Take 10 mg by mouth daily.   Yes Historical Provider, MD  aspirin 81 MG tablet Take 81 mg by mouth daily.   Yes Historical  Provider, MD  carvedilol (COREG) 25 MG tablet Take 25 mg by mouth 2 (two) times daily with a meal.   Yes Historical Provider, MD  docusate sodium (COLACE) 100 MG capsule Take 100 mg by mouth 2 (two) times daily.   Yes Historical Provider, MD  ferrous sulfate 325 (65 FE) MG tablet Take 325 mg by mouth 2 (two) times daily.   Yes Historical Provider, MD  hydrALAZINE (APRESOLINE) 10 MG tablet Take 10 mg by mouth 3 (three) times daily.   Yes Historical Provider, MD  hydrochlorothiazide (HYDRODIURIL) 25 MG tablet Take 25 mg by mouth daily.   Yes Historical Provider, MD  insulin NPH-insulin regular (NOVOLIN 70/30) (70-30) 100 UNIT/ML injection Inject 10 Units into the skin 2 (two) times daily with a meal.    Yes Historical Provider, MD  latanoprost (XALATAN) 0.005 % ophthalmic solution Place 1 drop into both eyes at bedtime.   Yes Historical Provider, MD  losartan (COZAAR) 100 MG tablet Take 100 mg by mouth daily.   Yes Historical Provider, MD  Multiple Vitamins-Minerals (CENTRUM SILVER PO) Take 1 tablet by mouth daily.   Yes Historical Provider, MD  pantoprazole (PROTONIX) 40 MG tablet Take 1 tablet (40 mg total) by mouth daily. 12/14/12  Yes Donne Hazel, MD  simvastatin (ZOCOR) 80 MG tablet Take 80 mg by mouth at bedtime.   Yes Historical Provider, MD  sucralfate (CARAFATE) 1 GM/10ML suspension Take 10 mL (1 g total) by mouth 4 (four) times daily. 12/14/12  Yes Donne Hazel, MD   Physical Exam: Filed Vitals:   07/29/13 0100  BP: 154/84  Pulse: 61  Temp:   Resp: 19    BP 154/84  Pulse 61  Temp(Src) 98.1 F (36.7 C) (Oral)  Resp 19  Ht 5\' 11"  (1.803 m)  Wt 82.555 kg (182 lb)  BMI 25.40 kg/m2  SpO2 97%  General Appearance:    Alert, oriented, no distress, appears stated age  Head:    Normocephalic, atraumatic  Eyes:    PERRL, EOMI, sclera non-icteric        Nose:   Nares without drainage or epistaxis. Mucosa, turbinates normal  Throat:   Moist mucous membranes. Oropharynx without  erythema or exudate.  Neck:   Supple. No carotid bruits.  No thyromegaly.  No lymphadenopathy.   Back:     No CVA tenderness, no spinal tenderness  Lungs:     Clear to auscultation bilaterally, without wheezes, rhonchi or rales  Chest wall:    No tenderness to palpitation  Heart:    Regular rate and rhythm without murmurs, gallops, rubs  Abdomen:     Soft, non-tender, nondistended, normal bowel sounds, no organomegaly  Genitalia:    deferred  Rectal:    deferred  Extremities:   No clubbing, cyanosis or edema.  Pulses:   2+ and symmetric all extremities  Skin:   Skin color, texture, turgor normal, no rashes or lesions  Lymph nodes:   Cervical, supraclavicular, and axillary nodes normal  Neurologic:   CNII-XII intact. Normal strength, sensation and reflexes      throughout    Labs on Admission:  Basic Metabolic Panel:  Recent Labs Lab 07/28/13 2255  NA 139  K 4.0  CL 96  CO2 30  GLUCOSE 119*  BUN 30*  CREATININE 1.46*  CALCIUM 9.3   Liver Function Tests:  Recent Labs Lab 07/28/13 2255  AST 30  ALT 33  ALKPHOS 48  BILITOT 0.3  PROT 6.9  ALBUMIN 3.5   No results found for this basename: LIPASE, AMYLASE,  in the last 168 hours No results found for this basename: AMMONIA,  in the last 168 hours CBC:  Recent Labs Lab 07/28/13 2255  WBC 6.9  NEUTROABS 4.0  HGB 14.2  HCT 43.5  MCV 86.5  PLT 197   Cardiac Enzymes: No results found for this basename: CKTOTAL, CKMB, CKMBINDEX, TROPONINI,  in the last 168 hours  BNP (last 3 results) No results found for this basename: PROBNP,  in the last 8760 hours CBG: No results found for this basename: GLUCAP,  in the last 168 hours  Radiological Exams on Admission: Dg Chest 2 View  07/29/2013   CLINICAL DATA:  Bradycardia.  EXAM: CHEST  2 VIEW  COMPARISON:  Chest radiograph performed 12/12/2012  FINDINGS: The lungs are well-aerated. Mild bilateral atelectasis is noted. There is no evidence of pleural effusion or  pneumothorax.  The heart is borderline normal in size. No acute osseous abnormalities are seen. Clips are noted within the right upper quadrant, reflecting prior cholecystectomy.  IMPRESSION: Mild bilateral atelectasis noted; lungs otherwise clear.   Electronically Signed   By: Garald Balding M.D.   On: 07/29/2013 00:17    EKG: Independently reviewed.  Sinus with occasional unifocal PVCs, 1st degree AV block.  Assessment/Plan Principal Problem:   Symptomatic bradycardia Active Problems:   Frequent unifocal PVCs  1. Symptomatic bradycardia - not currently having this on the monitor.  Will attempt to treat initally by reducing the amount of blocking agents the patient is on.  Specifically will start by halving his carvidilol dose to 12.5 mg BID from its current 25 mg BID (max dose).  He is also on max dose of norvasc as well, so there are plenty of rate reducing agents left to remove if needed.  Suspect that we will need to adjust some other medication to control his blood pressure as a result of this change however, as he finally just got his BP under control recently (on multiple agents).  Tele monitor. 2. Unifocal PVCs - daughter also expressed concern over "skipped beats" of his heart that occurred occasionally.  She took his pulse and told me when it was happening.  The "skipped beats" matched up exactly with the longer (longer than usual with sinus beats) pauses that were occuring on the monitor after patient was throwing a PVC.  Provided reassurance, they can discuss this with cardiology, but no specific treatment for PVCs anticipated during hospital stay, patient is asymptomatic with respect to these.   Code Status: Full Code  Family Communication: Daughter at bedside Disposition Plan: Admit to obs   Time spent: 70 min  Annsley Akkerman M. [facility] Pager [phone]  If 7AM-7PM, please contact the day team taking care of the patient [email or web address] Password [email or web address] 07/29/2013, 1:04  AM

## 2013-07-29 NOTE — Progress Notes (Signed)
PROGRESS NOTE  Jose Kennedy:[id number] DOB: Aug 23, 1927 DOA: 07/28/2013 PCP: Pcp Not In System  Assessment/Plan: Symptomatic Bradycardia -EKG shows first-degree AV block -Symptomatically better with decreasing carvedilol -HR in 60s presently -Echocardiogram -Repeat EKG -TSH -07/28/2013 EKG shows sinus rhythm, first-degree Hypertension -Continue amlodipine, carvedilol, HCTZ, hydralazine, losartan Diabetes mellitus type 2 -Hemoglobin A1c -Continue 70/30 insulin -NovoLog sliding scale GERD -Continue Carafate, Protonix -Antireflux measures CKD stage 2-3  -baseline creatinine1.0-1.4 -hold HCTZ today  Family Communication:   son at beside Disposition Plan:   Home when medically stable       Procedures/Studies: Dg Chest 2 View  07/29/2013   CLINICAL DATA:  Bradycardia.  EXAM: CHEST  2 VIEW  COMPARISON:  Chest radiograph performed 12/12/2012  FINDINGS: The lungs are well-aerated. Mild bilateral atelectasis is noted. There is no evidence of pleural effusion or pneumothorax.  The heart is borderline normal in size. No acute osseous abnormalities are seen. Clips are noted within the right upper quadrant, reflecting prior cholecystectomy.  IMPRESSION: Mild bilateral atelectasis noted; lungs otherwise clear.   Electronically Signed   By: Garald Balding M.D.   On: 07/29/2013 00:17         Subjective: Patient is feeling better. He denies any fevers chills, chest discomfort, shortness breath, nausea, vomiting, diarrhea, abdominal pain. He felt stronger than yesterday. Denies any headache or dizziness.  Objective: Filed Vitals:   07/29/13 0100 07/29/13 0133 07/29/13 0217 07/29/13 0756  BP: 154/84 151/119 149/58 144/63  Pulse: 61 65 59 61  Temp:  98.9 F (37.2 C)    TempSrc:  Oral    Resp: 19 18    Height:      Weight:      SpO2: 97% 95%      Intake/Output Summary (Last 24 hours) at 07/29/13 1016 Last data filed at 07/29/13 0947  Gross per 24 hour  Intake      0 ml    Output    580 ml  Net   -580 ml   Weight change:  Exam:   General:  Pt is alert, follows commands appropriately, not in acute distress  HEENT: No icterus, No thrush, [place]/AT  Cardiovascular: RRR, S1/S2, no rubs, no gallops  Respiratory: CTA bilaterally, no wheezing, no crackles, no rhonchi  Abdomen: Soft/+BS, non tender, non distended, no guarding  Extremities: trace LE edema, No lymphangitis, No petechiae, No rashes, no synovitis  Data Reviewed: Basic Metabolic Panel:  Recent Labs Lab 07/28/13 2255  NA 139  K 4.0  CL 96  CO2 30  GLUCOSE 119*  BUN 30*  CREATININE 1.46*  CALCIUM 9.3   Liver Function Tests:  Recent Labs Lab 07/28/13 2255  AST 30  ALT 33  ALKPHOS 48  BILITOT 0.3  PROT 6.9  ALBUMIN 3.5   No results found for this basename: LIPASE, AMYLASE,  in the last 168 hours No results found for this basename: AMMONIA,  in the last 168 hours CBC:  Recent Labs Lab 07/28/13 2255  WBC 6.9  NEUTROABS 4.0  HGB 14.2  HCT 43.5  MCV 86.5  PLT 197   Cardiac Enzymes: No results found for this basename: CKTOTAL, CKMB, CKMBINDEX, TROPONINI,  in the last 168 hours BNP: No components found with this basename: POCBNP,  CBG: No results found for this basename: GLUCAP,  in the last 168 hours  No results found for this or any previous visit (from the past 240 hour(s)).   Scheduled Meds: . amLODipine  10 mg Oral  Daily  . aspirin EC  81 mg Oral Daily  . atorvastatin  40 mg Oral q1800  . carvedilol  12.5 mg Oral BID WC  . docusate sodium  100 mg Oral BID  . ferrous sulfate  325 mg Oral BID WC  . heparin  5,000 Units Subcutaneous 3 times per day  . hydrALAZINE  10 mg Oral 3 times per day  . hydrochlorothiazide  25 mg Oral Daily  . insulin aspart protamine- aspart  10 Units Subcutaneous BID WC  . latanoprost  1 drop Both Eyes QHS  . losartan  100 mg Oral Daily  . pantoprazole  40 mg Oral Daily  . sodium chloride  3 mL Intravenous Q12H  . sucralfate  1 g  Oral TID AC & HS   Continuous Infusions:    Jose Spira, DO  [facility] Pager [phone]  If 7PM-7AM, please contact night-coverage [email or web address] Password [email or web address] 07/29/2013, 10:16 AM   LOS: 1 day

## 2013-07-30 DIAGNOSIS — N183 Chronic kidney disease, stage 3 unspecified: Secondary | ICD-10-CM

## 2013-07-30 LAB — BASIC METABOLIC PANEL
BUN: 27 mg/dL — ABNORMAL HIGH (ref 6–23)
CALCIUM: 9.1 mg/dL (ref 8.4–10.5)
CO2: 33 mEq/L — ABNORMAL HIGH (ref 19–32)
CREATININE: 1.32 mg/dL (ref 0.50–1.35)
Chloride: 98 mEq/L (ref 96–112)
GFR calc Af Amer: 56 mL/min — ABNORMAL LOW (ref >90)
GFR, EST NON AFRICAN AMERICAN: 48 mL/min — AB (ref >90)
GLUCOSE: 82 mg/dL (ref 70–99)
Potassium: 3.6 mEq/L — ABNORMAL LOW (ref 3.7–5.3)
Sodium: 142 mEq/L (ref 137–147)

## 2013-07-30 LAB — GLUCOSE, CAPILLARY
GLUCOSE-CAPILLARY: 80 mg/dL (ref 70–99)
Glucose-Capillary: 98 mg/dL (ref 70–99)

## 2013-07-30 LAB — HEMOGLOBIN A1C
Hgb A1c MFr Bld: 6.4 % — ABNORMAL HIGH (ref <5.7)
Mean Plasma Glucose: 137 mg/dL — ABNORMAL HIGH (ref <117)

## 2013-07-30 LAB — MAGNESIUM: Magnesium: 1.9 mg/dL (ref 1.5–2.5)

## 2013-07-30 LAB — TSH: TSH: 0.71 u[IU]/mL (ref 0.350–4.500)

## 2013-07-30 MED ORDER — CARVEDILOL 12.5 MG PO TABS: 12.5000 mg | ORAL_TABLET | Freq: Two times a day (BID) | ORAL | Status: DC

## 2013-07-30 NOTE — Discharge Summary (Signed)
Physician Discharge Summary  Jose Kennedy [id number] DOB: 1927-09-09 DOA: 07/28/2013  PCP: Pcp Not In System  Admit date: 07/28/2013 Discharge date: 07/30/2013  Recommendations for Outpatient Follow-up:  1. Pt will need to follow up with PCP in 2 weeks post discharge 2. Please obtain BMP to evaluate electrolytes and kidney function 3. Please also check CBC to evaluate Hg and Hct levels   Discharge Diagnoses:  Principal Problem:   Symptomatic bradycardia Active Problems:   Frequent unifocal PVCs   CKD (chronic kidney disease) stage 3, GFR 30-59 ml/min Symptomatic Bradycardia  -initial EKG shows first-degree AV block  -Symptomatically better with decreasing carvedilol to 12.5mg  bid -HR in low  60s presently after decrease of carvedilol -Echocardiogram--EF 27-25, grade 1 diastolic dysfxn, no WMA  -Repeat EKG--sinus with first degreee AVB -TSH--0.710 -07/28/2013 EKG shows sinus rhythm, first-degree  Hypertension  -Continue amlodipine, carvedilol, HCTZ, hydralazine, losartan  Diabetes mellitus type 2  -Hemoglobin A1c--pending at time of discharge -Continue 70/30 insulin--home dose  -NovoLog sliding scale  -CBGs controlled during hospitalization GERD  -Continue Carafate, Protonix  -Antireflux measures  CKD stage 2-3  -baseline creatinine1.0-1.4  -hold HCTZ today--restart after d/c -serum creatinine 1.32 on day of d/c Family Communication: son at beside  Disposition Plan: Home when medically stable   Discharge Condition: stable  Disposition: home  Diet:heart healthy Wt Readings from Last 3 Encounters:  07/28/13 82.555 kg (182 lb)  12/13/12 82.668 kg (182 lb 4 oz)  08/26/11 80.7 kg (177 lb 14.6 oz)    History of present illness:   HPI: Jose Kennedy is a 80 y.o. male who presents to the ED with cc of generalized weakness at home and bradycardia. Specifically he has episodes over the course of the day prior to admission when he has been feeling weak and "out of it".  Conveniently the family has a heart rate monitor at home, and noticed that his heart rate was running quite low during these periods (40s). Additionally he would feel better when his heart rate came back up towards the 50s and 60s. They presented to the ED 07/28/13 evening.  In the ED patient denies CP or shortness of breath and feels ok. The patient's carvedilol was decreased to 12.5 mg twice a day. EKG showed first degree AV block. The patient's repeat EKG the following day revealed no significant changes. Echocardiogram showed EF 60-65% with grade 1 diastolic dysfunction. The patient's resting heart rate was in the upper 50s and low 60s with activity. Symptomatically, the patient felt better. His blood pressure remained controlled. The patient was discharged in stable condition with instructions to followup with his primary care provider.    Discharge Exam: Filed Vitals:   07/30/13 1330  BP: 142/73  Pulse: 60  Temp: 98.6 F (37 C)  Resp: 18   Filed Vitals:   07/29/13 1700 07/29/13 2039 07/30/13 0506 07/30/13 1330  BP: 120/64 125/63 132/87 142/73  Pulse: 65 68 61 60  Temp:  99.2 F (37.3 C) 98.8 F (37.1 C) 98.6 F (37 C)  TempSrc:  Oral Oral Oral  Resp:  18 18 18   Height:      Weight:      SpO2:  97% 96% 97%   General: A&O x 3, NAD, pleasant, cooperative Cardiovascular: RRR, no rub, no gallop, no S3 Respiratory: CTAB, no wheeze, no rhonchi Abdomen:soft, nontender, nondistended, positive bowel sounds Extremities: Trace LE edema, No lymphangitis, no petechiae  Discharge Instructions      Discharge Orders   Future Appointments  Provider Department Dept Phone   10/09/2013 9:15 AM Kendell Bane, [facility]   Future Orders Complete By Expires   Diet - low sodium heart healthy  As directed    Increase activity slowly  As directed        Medication List         amLODipine 10 MG tablet  Commonly known as:  NORVASC  Take 10 mg by mouth  daily.     aspirin 81 MG tablet  Take 81 mg by mouth daily.     carvedilol 12.5 MG tablet  Commonly known as:  COREG  Take 1 tablet (12.5 mg total) by mouth 2 (two) times daily with a meal.     CENTRUM SILVER PO  Take 1 tablet by mouth daily.     docusate sodium 100 MG capsule  Commonly known as:  COLACE  Take 100 mg by mouth 2 (two) times daily.     ferrous sulfate 325 (65 FE) MG tablet  Take 325 mg by mouth 2 (two) times daily.     hydrALAZINE 10 MG tablet  Commonly known as:  APRESOLINE  Take 10 mg by mouth 3 (three) times daily.     hydrochlorothiazide 25 MG tablet  Commonly known as:  HYDRODIURIL  Take 25 mg by mouth daily.     insulin NPH-regular Human (70-30) 100 UNIT/ML injection  Commonly known as:  NOVOLIN 70/30  Inject 10 Units into the skin 2 (two) times daily with a meal.     latanoprost 0.005 % ophthalmic solution  Commonly known as:  XALATAN  Place 1 drop into both eyes at bedtime.     losartan 100 MG tablet  Commonly known as:  COZAAR  Take 100 mg by mouth daily.     pantoprazole 40 MG tablet  Commonly known as:  PROTONIX  Take 1 tablet (40 mg total) by mouth daily.     simvastatin 80 MG tablet  Commonly known as:  ZOCOR  Take 80 mg by mouth at bedtime.     sucralfate 1 GM/10ML suspension  Commonly known as:  CARAFATE  Take 10 mL (1 g total) by mouth 4 (four) times daily.         The results of significant diagnostics from this hospitalization (including imaging, microbiology, ancillary and laboratory) are listed below for reference.    Significant Diagnostic Studies: Dg Chest 2 View  07/29/2013   CLINICAL DATA:  Bradycardia.  EXAM: CHEST  2 VIEW  COMPARISON:  Chest radiograph performed 12/12/2012  FINDINGS: The lungs are well-aerated. Mild bilateral atelectasis is noted. There is no evidence of pleural effusion or pneumothorax.  The heart is borderline normal in size. No acute osseous abnormalities are seen. Clips are noted within the right  upper quadrant, reflecting prior cholecystectomy.  IMPRESSION: Mild bilateral atelectasis noted; lungs otherwise clear.   Electronically Signed   By: Garald Balding M.D.   On: 07/29/2013 00:17     Microbiology: No results found for this or any previous visit (from the past 240 hour(s)).   Labs: Basic Metabolic Panel:  Recent Labs Lab 07/28/13 2255 07/30/13 0330  NA 139 142  K 4.0 3.6*  CL 96 98  CO2 30 33*  GLUCOSE 119* 82  BUN 30* 27*  CREATININE 1.46* 1.32  CALCIUM 9.3 9.1  MG  --  1.9   Liver Function Tests:  Recent Labs Lab 07/28/13 2255  AST 30  ALT 33  ALKPHOS 48  BILITOT 0.3  PROT 6.9  ALBUMIN 3.5   No results found for this basename: LIPASE, AMYLASE,  in the last 168 hours No results found for this basename: AMMONIA,  in the last 168 hours CBC:  Recent Labs Lab 07/28/13 2255  WBC 6.9  NEUTROABS 4.0  HGB 14.2  HCT 43.5  MCV 86.5  PLT 197   Cardiac Enzymes: No results found for this basename: CKTOTAL, CKMB, CKMBINDEX, TROPONINI,  in the last 168 hours BNP: No components found with this basename: POCBNP,  CBG:  Recent Labs Lab 07/29/13 1612 07/29/13 1806 07/29/13 2141 07/30/13 0624 07/30/13 1108  GLUCAP 209* 148* 124* 80 98    Time coordinating discharge:  Greater than 30 minutes  Signed:  Holger Sokolowski, DO [facility] Pager: [phone] 07/30/2013, 3:40 PM

## 2013-09-19 ENCOUNTER — Observation Stay (HOSPITAL_COMMUNITY): Payer: Medicare Other

## 2013-09-19 ENCOUNTER — Encounter (HOSPITAL_COMMUNITY): Payer: Self-pay | Admitting: Emergency Medicine

## 2013-09-19 ENCOUNTER — Observation Stay (HOSPITAL_COMMUNITY)
Admission: EM | Admit: 2013-09-19 | Discharge: 2013-09-21 | Disposition: A | Discharge status: Home Health | Payer: Medicare Other | Attending: Internal Medicine | Admitting: Internal Medicine

## 2013-09-19 DIAGNOSIS — K2100 Gastro-esophageal reflux disease with esophagitis, without bleeding: Secondary | ICD-10-CM | POA: Insufficient documentation

## 2013-09-19 DIAGNOSIS — K449 Diaphragmatic hernia without obstruction or gangrene: Secondary | ICD-10-CM | POA: Insufficient documentation

## 2013-09-19 DIAGNOSIS — Z794 Long term (current) use of insulin: Secondary | ICD-10-CM | POA: Insufficient documentation

## 2013-09-19 DIAGNOSIS — Z8673 Personal history of transient ischemic attack (TIA), and cerebral infarction without residual deficits: Secondary | ICD-10-CM | POA: Insufficient documentation

## 2013-09-19 DIAGNOSIS — Z8719 Personal history of other diseases of the digestive system: Secondary | ICD-10-CM

## 2013-09-19 DIAGNOSIS — K920 Hematemesis: Secondary | ICD-10-CM | POA: Diagnosis not present

## 2013-09-19 DIAGNOSIS — J3800 Paralysis of vocal cords and larynx, unspecified: Secondary | ICD-10-CM | POA: Insufficient documentation

## 2013-09-19 DIAGNOSIS — E785 Hyperlipidemia, unspecified: Secondary | ICD-10-CM | POA: Diagnosis not present

## 2013-09-19 DIAGNOSIS — N183 Chronic kidney disease, stage 3 unspecified: Secondary | ICD-10-CM | POA: Diagnosis present

## 2013-09-19 DIAGNOSIS — Z8711 Personal history of peptic ulcer disease: Secondary | ICD-10-CM | POA: Diagnosis not present

## 2013-09-19 DIAGNOSIS — R918 Other nonspecific abnormal finding of lung field: Secondary | ICD-10-CM | POA: Diagnosis not present

## 2013-09-19 DIAGNOSIS — Z87891 Personal history of nicotine dependence: Secondary | ICD-10-CM | POA: Insufficient documentation

## 2013-09-19 DIAGNOSIS — Z7982 Long term (current) use of aspirin: Secondary | ICD-10-CM | POA: Insufficient documentation

## 2013-09-19 DIAGNOSIS — R001 Bradycardia, unspecified: Secondary | ICD-10-CM

## 2013-09-19 DIAGNOSIS — R042 Hemoptysis: Secondary | ICD-10-CM | POA: Diagnosis present

## 2013-09-19 DIAGNOSIS — I129 Hypertensive chronic kidney disease with stage 1 through stage 4 chronic kidney disease, or unspecified chronic kidney disease: Secondary | ICD-10-CM | POA: Diagnosis not present

## 2013-09-19 DIAGNOSIS — K922 Gastrointestinal hemorrhage, unspecified: Secondary | ICD-10-CM

## 2013-09-19 DIAGNOSIS — N289 Disorder of kidney and ureter, unspecified: Secondary | ICD-10-CM

## 2013-09-19 DIAGNOSIS — I10 Essential (primary) hypertension: Secondary | ICD-10-CM

## 2013-09-19 DIAGNOSIS — I639 Cerebral infarction, unspecified: Secondary | ICD-10-CM

## 2013-09-19 DIAGNOSIS — E119 Type 2 diabetes mellitus without complications: Secondary | ICD-10-CM | POA: Insufficient documentation

## 2013-09-19 DIAGNOSIS — Y844 Aspiration of fluid as the cause of abnormal reaction of the patient, or of later complication, without mention of misadventure at the time of the procedure: Secondary | ICD-10-CM | POA: Diagnosis present

## 2013-09-19 DIAGNOSIS — D131 Benign neoplasm of stomach: Secondary | ICD-10-CM | POA: Insufficient documentation

## 2013-09-19 DIAGNOSIS — I493 Ventricular premature depolarization: Secondary | ICD-10-CM

## 2013-09-19 DIAGNOSIS — I2510 Atherosclerotic heart disease of native coronary artery without angina pectoris: Secondary | ICD-10-CM | POA: Diagnosis not present

## 2013-09-19 DIAGNOSIS — K219 Gastro-esophageal reflux disease without esophagitis: Secondary | ICD-10-CM

## 2013-09-19 HISTORY — DX: Cardiac murmur, unspecified: R01.1

## 2013-09-19 LAB — OCCULT BLOOD GASTRIC / DUODENUM (SPECIMEN CUP): OCCULT BLOOD, GASTRIC: POSITIVE — AB

## 2013-09-19 LAB — COMPREHENSIVE METABOLIC PANEL
ALBUMIN: 3.3 g/dL — AB (ref 3.5–5.2)
ALT: 18 U/L (ref 0–53)
AST: 21 U/L (ref 0–37)
Alkaline Phosphatase: 47 U/L (ref 39–117)
BUN: 29 mg/dL — ABNORMAL HIGH (ref 6–23)
CALCIUM: 9.1 mg/dL (ref 8.4–10.5)
CO2: 30 mEq/L (ref 19–32)
CREATININE: 1.25 mg/dL (ref 0.50–1.35)
Chloride: 96 mEq/L (ref 96–112)
GFR calc Af Amer: 60 mL/min — ABNORMAL LOW (ref >90)
GFR, EST NON AFRICAN AMERICAN: 51 mL/min — AB (ref >90)
Glucose, Bld: 141 mg/dL — ABNORMAL HIGH (ref 70–99)
Potassium: 3.9 mEq/L (ref 3.7–5.3)
Sodium: 137 mEq/L (ref 137–147)
Total Bilirubin: 0.4 mg/dL (ref 0.3–1.2)
Total Protein: 6.3 g/dL (ref 6.0–8.3)

## 2013-09-19 LAB — CBC WITH DIFFERENTIAL/PLATELET
BASOS ABS: 0 10*3/uL (ref 0.0–0.1)
Basophils Relative: 0 % (ref 0–1)
EOS PCT: 6 % — AB (ref 0–5)
Eosinophils Absolute: 0.4 10*3/uL (ref 0.0–0.7)
HCT: 41.4 % (ref 39.0–52.0)
Hemoglobin: 13 g/dL (ref 13.0–17.0)
Lymphocytes Relative: 22 % (ref 12–46)
Lymphs Abs: 1.5 10*3/uL (ref 0.7–4.0)
MCH: 27.4 pg (ref 26.0–34.0)
MCHC: 31.4 g/dL (ref 30.0–36.0)
MCV: 87.2 fL (ref 78.0–100.0)
Monocytes Absolute: 0.4 10*3/uL (ref 0.1–1.0)
Monocytes Relative: 6 % (ref 3–12)
NEUTROS ABS: 4.4 10*3/uL (ref 1.7–7.7)
Neutrophils Relative %: 66 % (ref 43–77)
Platelets: 184 10*3/uL (ref 150–400)
RBC: 4.75 MIL/uL (ref 4.22–5.81)
RDW: 13.6 % (ref 11.5–15.5)
WBC: 6.8 10*3/uL (ref 4.0–10.5)

## 2013-09-19 LAB — PROTIME-INR
INR: 0.95 (ref 0.00–1.49)
PROTHROMBIN TIME: 12.5 s (ref 11.6–15.2)

## 2013-09-19 LAB — GLUCOSE, CAPILLARY
GLUCOSE-CAPILLARY: 115 mg/dL — AB (ref 70–99)
Glucose-Capillary: 149 mg/dL — ABNORMAL HIGH (ref 70–99)
Glucose-Capillary: 175 mg/dL — ABNORMAL HIGH (ref 70–99)
Glucose-Capillary: 145 mg/dL — ABNORMAL HIGH (ref 70–99)
Glucose-Capillary: 126 mg/dL — ABNORMAL HIGH (ref 70–99)

## 2013-09-19 LAB — HEMOGLOBIN AND HEMATOCRIT, BLOOD
HCT: 37.9 % — ABNORMAL LOW (ref 39.0–52.0)
HEMATOCRIT: 41.6 % (ref 39.0–52.0)
HEMOGLOBIN: 13.2 g/dL (ref 13.0–17.0)
Hemoglobin: 12.2 g/dL — ABNORMAL LOW (ref 13.0–17.0)

## 2013-09-19 LAB — APTT: aPTT: 30 seconds (ref 24–37)

## 2013-09-19 LAB — POC OCCULT BLOOD, ED: FECAL OCCULT BLD: POSITIVE — AB

## 2013-09-19 MED ORDER — SODIUM CHLORIDE 0.9 % IV SOLN
INTRAVENOUS | Status: AC
  Administered 2013-09-19: 1000 mL via INTRAVENOUS
  Administered 2013-09-19: 23:00:00 via INTRAVENOUS

## 2013-09-19 MED ORDER — ONDANSETRON HCL 4 MG/2ML IJ SOLN
4.0000 mg | Freq: Once | INTRAMUSCULAR | Status: AC
  Administered 2013-09-19: 4 mg via INTRAVENOUS
  Filled 2013-09-19: qty 2

## 2013-09-19 MED ORDER — LOSARTAN POTASSIUM 50 MG PO TABS
100.0000 mg | ORAL_TABLET | Freq: Every day | ORAL | Status: DC
  Administered 2013-09-19 – 2013-09-21 (×3): 100 mg via ORAL
  Filled 2013-09-19 (×3): qty 2

## 2013-09-19 MED ORDER — ONDANSETRON HCL 4 MG/2ML IJ SOLN: 4.0000 mg | Freq: Three times a day (TID) | INTRAMUSCULAR | Status: AC | PRN

## 2013-09-19 MED ORDER — ATORVASTATIN CALCIUM 40 MG PO TABS
40.0000 mg | ORAL_TABLET | Freq: Every day | ORAL | Status: DC
  Administered 2013-09-19 – 2013-09-21 (×3): 40 mg via ORAL
  Filled 2013-09-19 (×3): qty 1

## 2013-09-19 MED ORDER — AMLODIPINE BESYLATE 10 MG PO TABS
10.0000 mg | ORAL_TABLET | Freq: Every day | ORAL | Status: DC
  Administered 2013-09-19 – 2013-09-21 (×3): 10 mg via ORAL
  Filled 2013-09-19 (×3): qty 1

## 2013-09-19 MED ORDER — HYDRALAZINE HCL 10 MG PO TABS
10.0000 mg | ORAL_TABLET | Freq: Three times a day (TID) | ORAL | Status: DC
  Administered 2013-09-19 – 2013-09-21 (×8): 10 mg via ORAL
  Filled 2013-09-19 (×9): qty 1

## 2013-09-19 MED ORDER — PANTOPRAZOLE SODIUM 40 MG IV SOLR
40.0000 mg | INTRAVENOUS | Status: DC
  Administered 2013-09-19: 40 mg via INTRAVENOUS
  Filled 2013-09-19 (×2): qty 40

## 2013-09-19 MED ORDER — CARVEDILOL 12.5 MG PO TABS
12.5000 mg | ORAL_TABLET | Freq: Two times a day (BID) | ORAL | Status: DC
  Administered 2013-09-19 – 2013-09-21 (×5): 12.5 mg via ORAL
  Filled 2013-09-19 (×7): qty 1

## 2013-09-19 MED ORDER — PANTOPRAZOLE SODIUM 40 MG IV SOLR
40.0000 mg | INTRAVENOUS | Status: AC
  Administered 2013-09-19: 40 mg via INTRAVENOUS
  Filled 2013-09-19: qty 40

## 2013-09-19 MED ORDER — INSULIN ASPART 100 UNIT/ML ~~LOC~~ SOLN
0.0000 [IU] | Freq: Three times a day (TID) | SUBCUTANEOUS | Status: DC
  Administered 2013-09-19 (×2): 1 [IU] via SUBCUTANEOUS
  Administered 2013-09-20: 3 [IU] via SUBCUTANEOUS
  Administered 2013-09-20 – 2013-09-21 (×2): 1 [IU] via SUBCUTANEOUS
  Administered 2013-09-21: 2 [IU] via SUBCUTANEOUS
  Administered 2013-09-21: 3 [IU] via SUBCUTANEOUS

## 2013-09-19 NOTE — ED Provider Notes (Signed)
CSN: [id number]     Arrival date & time 09/19/13  7829 History   None    Chief Complaint  Patient presents with  . Hemoptysis     (Consider location/radiation/quality/duration/timing/severity/associated sxs/prior Treatment) HPI Comments: The patient is an 80 year old male, history of esophagitis based on endoscopy in 2013. He presents with a complaint of vomiting coffee ground emesis. This has been ongoing for a couple of days, gradually worsening but not associated with any changes in his bowel habits. Because the patient is on iron he states that he always has tarry-colored stools. He denies abdominal pain or chest pain and he denies any coughing or shortness of breath. Nothing seems to make this better or worse, he has severe acid reflux requiring both a proton pump inhibitor as well as Carafate which she takes daily.  The history is provided by the patient.    Past Medical History  Diagnosis Date  . Stroke   . Coronary artery disease   . Hypertension   . GERD (gastroesophageal reflux disease)   . History of stomach ulcers   . Diabetes mellitus     insulin dependent   Past Surgical History  Procedure Laterality Date  . Laparoscopic cholecystectomy    . Back surgery    . Thyroid nodual    . Appendectomy    . Esophagogastroduodenoscopy  08/26/2011    Procedure: ESOPHAGOGASTRODUODENOSCOPY (EGD);  Surgeon: Wonda Horner, MD;  Location: [facility] ENDOSCOPY;  Service: Endoscopy;  Laterality: N/A;   No family history on file. History  Substance Use Topics  . Smoking status: Former Research scientist (life sciences)  . Smokeless tobacco: Never Used  . Alcohol Use: No    Review of Systems  All other systems reviewed and are negative.     Allergies  Review of patient's allergies indicates no known allergies.  Home Medications   Prior to Admission medications   Medication Sig Start Date End Date Taking? Authorizing Provider  amLODipine (NORVASC) 10 MG tablet Take 10 mg by mouth daily.    Historical  Provider, MD  aspirin 81 MG tablet Take 81 mg by mouth daily.    Historical Provider, MD  carvedilol (COREG) 12.5 MG tablet Take 1 tablet (12.5 mg total) by mouth 2 (two) times daily with a meal. 07/30/13   Orson Eva, MD  docusate sodium (COLACE) 100 MG capsule Take 100 mg by mouth 2 (two) times daily.    Historical Provider, MD  ferrous sulfate 325 (65 FE) MG tablet Take 325 mg by mouth 2 (two) times daily.    Historical Provider, MD  hydrALAZINE (APRESOLINE) 10 MG tablet Take 10 mg by mouth 3 (three) times daily.    Historical Provider, MD  hydrochlorothiazide (HYDRODIURIL) 25 MG tablet Take 25 mg by mouth daily.    Historical Provider, MD  insulin NPH-insulin regular (NOVOLIN 70/30) (70-30) 100 UNIT/ML injection Inject 10 Units into the skin 2 (two) times daily with a meal.     Historical Provider, MD  latanoprost (XALATAN) 0.005 % ophthalmic solution Place 1 drop into both eyes at bedtime.    Historical Provider, MD  losartan (COZAAR) 100 MG tablet Take 100 mg by mouth daily.    Historical Provider, MD  Multiple Vitamins-Minerals (CENTRUM SILVER PO) Take 1 tablet by mouth daily.    Historical Provider, MD  pantoprazole (PROTONIX) 40 MG tablet Take 1 tablet (40 mg total) by mouth daily. 12/14/12   Donne Hazel, MD  simvastatin (ZOCOR) 80 MG tablet Take 80 mg by mouth at  bedtime.    Historical Provider, MD  sucralfate (CARAFATE) 1 GM/10ML suspension Take 10 mL (1 g total) by mouth 4 (four) times daily. 12/14/12   Donne Hazel, MD   BP 132/91  Pulse 75  Temp(Src) 99 F (37.2 C) (Oral)  Resp 25  Ht 5\' 11"  (1.803 m)  Wt 180 lb (81.647 kg)  BMI 25.12 kg/m2  SpO2 100% Physical Exam  Nursing note and vitals reviewed. Constitutional: He appears well-developed and well-nourished. No distress.  HENT:  Head: Normocephalic and atraumatic.  Mouth/Throat: Oropharynx is clear and moist. No oropharyngeal exudate.  Eyes: Conjunctivae and EOM are normal. Pupils are equal, round, and reactive to light.  Right eye exhibits no discharge. Left eye exhibits no discharge. No scleral icterus.  Neck: Normal range of motion. Neck supple. No JVD present. No thyromegaly present.  Cardiovascular: Normal rate, regular rhythm, normal heart sounds and intact distal pulses.  Exam reveals no gallop and no friction rub.   No murmur heard. Pulmonary/Chest: Effort normal and breath sounds normal. No respiratory distress. He has no wheezes. He has no rales.  Abdominal: Soft. Bowel sounds are normal. He exhibits no distension and no mass. There is no tenderness.  Genitourinary:  Rectal exam shows no hemorrhoids, no fissures, stool is black and tarry  Musculoskeletal: Normal range of motion. He exhibits no edema and no tenderness.  Lymphadenopathy:    He has no cervical adenopathy.  Neurological: He is alert. Coordination normal.  Skin: Skin is warm and dry. No rash noted. No erythema.  Psychiatric: He has a normal mood and affect. His behavior is normal.    ED Course  Procedures (including critical care time) Labs Review Labs Reviewed  CBC WITH DIFFERENTIAL - Abnormal; Notable for the following:    Eosinophils Relative 6 (*)    All other components within normal limits  COMPREHENSIVE METABOLIC PANEL - Abnormal; Notable for the following:    Glucose, Bld 141 (*)    BUN 29 (*)    Albumin 3.3 (*)    GFR calc non Af Amer 51 (*)    GFR calc Af Amer 60 (*)    All other components within normal limits  POC OCCULT BLOOD, ED - Abnormal; Notable for the following:    Fecal Occult Bld POSITIVE (*)    All other components within normal limits  APTT  PROTIME-INR  OCCULT BLOOD GASTRIC / DUODENUM (SPECIMEN CUP)    Imaging Review No results found.   EKG Interpretation   Date/Time:  Tuesday Sep 19 2013 04:42:06 EDT Ventricular Rate:  82 PR Interval:  235 QRS Duration: 92 QT Interval:  375 QTC Calculation: 438 R Axis:   57 Text Interpretation:  Sinus rhythm Premature ventricular complexes  Prolonged PR  interval Anterior infarct, old Abnormal ekg since last  tracing no significant change Confirmed by Dailyn Kempner  MD, Kamille Toomey ([id number]) on  09/19/2013 4:49:18 AM      MDM   Final diagnoses:  Hemoptysis    The patient has no abdominal tenderness, he is able to spit up some stairs which has small flecks of red blood but no signs of coffee grounds. He does not appear to be in distress, his vital signs show hypertension with a blood pressure of 170/90. EKG performed by nursing shows PVCs but otherwise a normal EKG. I will perform a digital rectal exam to look at his stool, lab work to look at his hemoglobin, observation, possible admission to the hospital for hematemesis. He denies a history  of alcohol use or any liver disease.  Pt has + fecal occult - no drop in Hgb, will admit to hospitalist  Johnna Acosta, MD 09/19/13 0630

## 2013-09-19 NOTE — ED Notes (Addendum)
Pt coming from home with c/o of coughing up blood for the past 2 days.  Pt has a hx of GERD.  Pt is having liquid amounts of blood coming up during ED assessment.  Pt has periods of difficulty speaking until he coughs up the red liquid.  Family at bedside.

## 2013-09-19 NOTE — ED Notes (Signed)
MD Sabra Heck at bedside for fecal Occult sample specimen collection.

## 2013-09-19 NOTE — Progress Notes (Signed)
Eliot Popper [id number] Admitted to 5W 36: 09/19/2013 8:12 AM Attending Provider: Cristal Ford, DO    Jose Kennedy is a 80 y.o. male patient admitted from ED awake, alert  & orientated  X 3,  Prior, VSS - Blood pressure 152/77, pulse 56, temperature 98.9 F (37.2 C), temperature source Oral, resp. rate 16, height 5\' 11"  (1.803 m), weight 83.2 kg (183 lb 6.8 oz), SpO2 94.00%., no c/o shortness of breath, no c/o chest pain, no distress noted.  Non- tele   IV site WDL:  antecubital left, condition patent and no redness with a transparent dsg that's clean dry and intact.  Allergies:  No Known Allergies   Past Medical History  Diagnosis Date  . Stroke   . Coronary artery disease   . Hypertension   . GERD (gastroesophageal reflux disease)   . History of stomach ulcers   . Diabetes mellitus     insulin dependent    History:  obtained from the patient & pt. son.  Pt orientation to unit, room and routine. Information packet given to patient/family and safety video watched.  Admission INP armband ID verified with patient/family, and in place. SR up x 2, fall risk assessment complete with Patient and family verbalizing understanding of risks associated with falls. Pt verbalizes an understanding of how to use the call bell and to call for help before getting out of bed.  Skin, clean-dry- intact without evidence of bruising, or skin tears.   No evidence of skin break down noted on exam.    Will cont to monitor and assist as needed.  Antonela Freiman Murvin Natal, RN 09/19/2013 8:12 AM

## 2013-09-19 NOTE — ED Notes (Signed)
Repeat EKG done due to PVC shown at the nurse's station.  MD Sabra Heck made aware.

## 2013-09-19 NOTE — ED Notes (Signed)
Gastro card sent to main lab per Labs request.

## 2013-09-19 NOTE — Consult Note (Signed)
Reason for Consult: Hematemesis Referring Physician: Hospital team  Jose Kennedy is an 80 y.o. male.  HPI: Patient seen by our group in the past and his last endoscopy was in 2013 and he has these periodic spells and our office computer and hospital computer charts were reviewed and the case was discussed with his son since he is down and x-ray and I will return later today or discussed tomorrow further with the patient and the case was discussed with the hospital team as well  Past Medical History  Diagnosis Date  . Stroke   . Coronary artery disease   . Hypertension   . GERD (gastroesophageal reflux disease)   . History of stomach ulcers   . Diabetes mellitus     insulin dependent    Past Surgical History  Procedure Laterality Date  . Laparoscopic cholecystectomy    . Back surgery    . Thyroid nodual    . Appendectomy    . Esophagogastroduodenoscopy  08/26/2011    Procedure: ESOPHAGOGASTRODUODENOSCOPY (EGD);  Surgeon: Wonda Horner, MD;  [facility] ENDOSCOPY;  Service: Endoscopy;  Laterality: N/A;    No family history on file.  Social History:  reports that he has quit smoking. He has never used smokeless tobacco. He reports that he does not drink alcohol or use illicit drugs.  Allergies: No Known Allergies  Medications: I have reviewed the patient's current medications.  Results for orders placed during the hospital encounter of 09/19/13 (from the past 48 hour(s))  CBC WITH DIFFERENTIAL     Status: Abnormal   Collection Time    09/19/13  4:45 AM      Result Value Ref Range   WBC 6.8  4.0 - 10.5 K/uL   RBC 4.75  4.22 - 5.81 MIL/uL   Hemoglobin 13.0  13.0 - 17.0 g/dL   HCT 41.4  39.0 - 52.0 %   MCV 87.2  78.0 - 100.0 fL   MCH 27.4  26.0 - 34.0 pg   MCHC 31.4  30.0 - 36.0 g/dL   RDW 13.6  11.5 - 15.5 %   Platelets 184  150 - 400 K/uL   Neutrophils Relative % 66  43 - 77 %   Neutro Abs 4.4  1.7 - 7.7 K/uL   Lymphocytes Relative 22  12 - 46 %   Lymphs Abs 1.5  0.7 -  4.0 K/uL   Monocytes Relative 6  3 - 12 %   Monocytes Absolute 0.4  0.1 - 1.0 K/uL   Eosinophils Relative 6 (*) 0 - 5 %   Eosinophils Absolute 0.4  0.0 - 0.7 K/uL   Basophils Relative 0  0 - 1 %   Basophils Absolute 0.0  0.0 - 0.1 K/uL  COMPREHENSIVE METABOLIC PANEL     Status: Abnormal   Collection Time    09/19/13  4:45 AM      Result Value Ref Range   Sodium 137  137 - 147 mEq/L   Potassium 3.9  3.7 - 5.3 mEq/L   Chloride 96  96 - 112 mEq/L   CO2 30  19 - 32 mEq/L   Glucose, Bld 141 (*) 70 - 99 mg/dL   BUN 29 (*) 6 - 23 mg/dL   Creatinine, Ser 1.25  0.50 - 1.35 mg/dL   Calcium 9.1  8.4 - 10.5 mg/dL   Total Protein 6.3  6.0 - 8.3 g/dL   Albumin 3.3 (*) 3.5 - 5.2 g/dL   AST 21  0 -  37 U/L   ALT 18  0 - 53 U/L   Alkaline Phosphatase 47  39 - 117 U/L   Total Bilirubin 0.4  0.3 - 1.2 mg/dL   GFR calc non Af Amer 51 (*) >90 mL/min   GFR calc Af Amer 60 (*) >90 mL/min   Comment: (NOTE)     The eGFR has been calculated using the CKD EPI equation.     This calculation has not been validated in all clinical situations.     eGFR's persistently <90 mL/min signify possible Chronic Kidney     Disease.  APTT     Status: None   Collection Time    09/19/13  4:45 AM      Result Value Ref Range   aPTT 30  24 - 37 seconds  PROTIME-INR     Status: None   Collection Time    09/19/13  4:45 AM      Result Value Ref Range   Prothrombin Time 12.5  11.6 - 15.2 seconds   INR 0.95  0.00 - 1.49  POC OCCULT BLOOD, ED     Status: Abnormal   Collection Time    09/19/13  5:15 AM      Result Value Ref Range   Fecal Occult Bld POSITIVE (*) NEGATIVE  OCCULT BLOOD GASTRIC / DUODENUM (SPECIMEN CUP)     Status: Abnormal   Collection Time    09/19/13  5:31 AM      Result Value Ref Range   pH, Gastric NOT DONE     Occult Blood, Gastric POSITIVE (*) NEGATIVE    Dg Chest 1 View  09/19/2013   CLINICAL DATA:  Hemoptysis versus hematemesis  EXAM: CHEST - 1 VIEW  COMPARISON:  07/28/2013  FINDINGS: The  heart size and mediastinal contours are within normal limits. Both lungs are clear. The visualized skeletal structures are unremarkable.  IMPRESSION: No active disease.   Electronically Signed   By: Franchot Gallo M.D.   On: 09/19/2013 09:33    ROS negative except above according to the son Blood pressure 152/77, pulse 56, temperature 98.9 F (37.2 C), temperature source Oral, resp. rate 16, height _0  (1.803 m), weight 83.2 kg (183 lb 6.8 oz), SpO2 94.00%. Physical Exam Patient not examined currently will be prior to endoscopy Assessment/Plan: Self-limited hematemesis recurrent Plan: I discussed endoscopy including the risks with the son and have scheduled the patient for tomorrow at 8 AM with further workup and plans pending those findings  Jeryl Columbia 09/19/2013, 10:09 AM

## 2013-09-19 NOTE — H&P (Signed)
[facility] History and Physical  Iran Rowe PTW:[id number] DOB: [birth year] DOA: 09/19/2013  Referring physician:  PCP: Pcp Not In System  Specialists:   Chief Complaint: Coffee-ground vomit  HPI: Jose Kennedy is a 80 y.o. male  With a history of coronary artery disease, hypertension, GERD, diabetes mellitus, esophagitis and 2013 that presents to the emergency room for complaint of coffee-ground emesis. Patient states has been going on for couple of days and has gradually worsened. He is also complained of dark tarry stools. He does take iron. Patient has had endoscopy in 2013 as well as one in May of 2015. Patient states he has been compliant with medication including his Carafate and proton pump inhibitor. He has seemed to help his acid reflux at this time or his emesis.  Patient denies any shortness of breath, chest pain, abdominal pain.  Review of Systems:  Constitutional: Denies fever, chills, diaphoresis, appetite change and fatigue.  HEENT: Denies photophobia, eye pain, redness, hearing loss, ear pain, congestion, sore throat, rhinorrhea, sneezing, mouth sores, trouble swallowing, neck pain, neck stiffness and tinnitus.   Respiratory: Denies SOB, DOE, cough, chest tightness,  and wheezing.   Cardiovascular: Denies chest pain, palpitations and leg swelling.  Gastrointestinal: Patient states he's had dark coffee ground emesis, black tarry stool. Genitourinary: Denies dysuria, urgency, frequency, hematuria, flank pain and difficulty urinating.  Musculoskeletal: Denies myalgias, back pain, joint swelling, arthralgias and gait problem.  Skin: Denies pallor, rash and wound.  Neurological: Denies dizziness, seizures, syncope, weakness, light-headedness, numbness and headaches.  Hematological: Denies adenopathy. Easy bruising, personal or family bleeding history  Psychiatric/Behavioral: Denies suicidal ideation, mood changes, confusion, nervousness, sleep disturbance and  agitation  Past Medical History  Diagnosis Date  . Stroke   . Coronary artery disease   . Hypertension   . GERD (gastroesophageal reflux disease)   . History of stomach ulcers   . Diabetes mellitus     insulin dependent   Past Surgical History  Procedure Laterality Date  . Laparoscopic cholecystectomy    . Back surgery    . Thyroid nodual    . Appendectomy    . Esophagogastroduodenoscopy  08/26/2011    Procedure: ESOPHAGOGASTRODUODENOSCOPY (EGD);  Surgeon: Wonda Horner, MD;  Location: [facility] ENDOSCOPY;  Service: Endoscopy;  Laterality: N/A;   Social History:  reports that he has quit smoking. He has never used smokeless tobacco. He reports that he does not drink alcohol or use illicit drugs.   No Known Allergies  No family history on file.   Prior to Admission medications   Medication Sig Start Date End Date Taking? Authorizing Provider  amLODipine (NORVASC) 10 MG tablet Take 10 mg by mouth daily.   Yes Historical Provider, MD  aspirin 81 MG tablet Take 81 mg by mouth daily.   Yes Historical Provider, MD  carvedilol (COREG) 12.5 MG tablet Take 1 tablet (12.5 mg total) by mouth 2 (two) times daily with a meal. 07/30/13  Yes Orson Eva, MD  docusate sodium (COLACE) 100 MG capsule Take 100 mg by mouth 2 (two) times daily.   Yes Historical Provider, MD  ferrous sulfate 325 (65 FE) MG tablet Take 325 mg by mouth 2 (two) times daily.   Yes Historical Provider, MD  hydrALAZINE (APRESOLINE) 10 MG tablet Take 10 mg by mouth 3 (three) times daily.   Yes Historical Provider, MD  hydrochlorothiazide (HYDRODIURIL) 25 MG tablet Take 25 mg by mouth daily.   Yes Historical Provider, MD  insulin NPH-insulin regular (NOVOLIN 70/30) (70-30)  100 UNIT/ML injection Inject 10 Units into the skin 2 (two) times daily with a meal.    Yes Historical Provider, MD  losartan (COZAAR) 100 MG tablet Take 100 mg by mouth daily.   Yes Historical Provider, MD  Multiple Vitamins-Minerals (CENTRUM SILVER PO) Take 1 tablet  by mouth once a week.    Yes Historical Provider, MD  Omeprazole-Sodium Bicarbonate (ZEGERID) 20-1100 MG CAPS capsule Take 1 capsule by mouth daily before breakfast.   Yes Historical Provider, MD  simvastatin (ZOCOR) 80 MG tablet Take 40 mg by mouth at bedtime.    Yes Historical Provider, MD  sucralfate (CARAFATE) 1 G tablet Take 1 g by mouth 4 (four) times daily.   Yes Historical Provider, MD  latanoprost (XALATAN) 0.005 % ophthalmic solution Place 1 drop into both eyes at bedtime.    Historical Provider, MD   Physical Exam: Filed Vitals:   09/19/13 0715  BP: 117/57  Pulse: 68  Temp:   Resp: 17     General: Well developed, well nourished, NAD, appears stated age  HEENT: NCAT, PERRLA, EOMI, Anicteic Sclera, mucous membranes moist.   Neck: Supple, no JVD, no masses  Cardiovascular: S1 S2 auscultated, no rubs, murmurs or gallops. Regular rate and rhythm.  Respiratory: Clear to auscultation bilaterally with equal chest rise  Abdomen: Soft, nontender, nondistended, + bowel sounds  Extremities: warm dry without cyanosis clubbing or edema  Neuro: AAOx3, cranial nerves grossly intact. Strength 5/5 in patient's upper and lower extremities bilaterally  Skin: Without rashes exudates or nodules  Psych: Normal affect and demeanor with intact judgement and insight  Labs on Admission:  Basic Metabolic Panel:  Recent Labs Lab 09/19/13 0445  NA 137  K 3.9  CL 96  CO2 30  GLUCOSE 141*  BUN 29*  CREATININE 1.25  CALCIUM 9.1   Liver Function Tests:  Recent Labs Lab 09/19/13 0445  AST 21  ALT 18  ALKPHOS 47  BILITOT 0.4  PROT 6.3  ALBUMIN 3.3*   No results found for this basename: LIPASE, AMYLASE,  in the last 168 hours No results found for this basename: AMMONIA,  in the last 168 hours CBC:  Recent Labs Lab 09/19/13 0445  WBC 6.8  NEUTROABS 4.4  HGB 13.0  HCT 41.4  MCV 87.2  PLT 184   Cardiac Enzymes: No results found for this basename: CKTOTAL, CKMB,  CKMBINDEX, TROPONINI,  in the last 168 hours  BNP (last 3 results) No results found for this basename: PROBNP,  in the last 8760 hours CBG: No results found for this basename: GLUCAP,  in the last 168 hours  Radiological Exams on Admission: No results found.  EKG: Independently reviewed. Sinus rhythm, rate 82, PVC, first degree AV block, PR 235  Assessment/Plan  GI bleed/hematemesis -Patient will be admitted to the medical floor -Fecal occult was positive -Hemoglobin remained stable at this time, 13 -Will place patient on clear liquid diet, IV Protonix -Spoke with Dr. Watt Climes, gastroenterologist, will see patient, may scope tomorrow -Will make patient NPO after midnight -patient had EGD with biopsy 08/26/2011: Patient found to have antral and gastric body polyps which were benign, possible esophagitis, was recommended to continue Carafate and PPI therapy -Will monitor hemoglobin/hematocrit every 12 hours  Hypertension -Continue losartan, hydralazine, HCTZ, Coreg, Norvasc  Hyperlipidemia -Continue statin  Diabetes mellitus -Patient hold insulin hold, continue insulin sliding scale with CBG monitoring -Last hemoglobin A1c 6.4 (07/30/2013)  GERD -Continue PPI  History laryngeal paralysis -Stable  DVT prophylaxis: SCDs  Code Status: Full  Condition: Guarded  Family Communication: Family at bedside. Admission, patients condition and plan of care including tests being ordered have been discussed with the patient and family who indicate understanding and agree with the plan and Code Status.  Disposition Plan: Admitted for observation  Time spent: 60 minutes  Pearlena Ow D.O. [facility] Pager [phone]  If 7PM-7AM, please contact night-coverage [email or web address] Password [facility] 09/19/2013, 7:57 AM

## 2013-09-20 ENCOUNTER — Encounter (HOSPITAL_COMMUNITY)
Admission: EM | Disposition: A | Discharge status: Home Health | Payer: Self-pay | Source: Home / Self Care | Attending: Emergency Medicine

## 2013-09-20 ENCOUNTER — Encounter (HOSPITAL_COMMUNITY): Payer: Self-pay | Admitting: *Deleted

## 2013-09-20 DIAGNOSIS — K920 Hematemesis: Secondary | ICD-10-CM | POA: Diagnosis not present

## 2013-09-20 DIAGNOSIS — R042 Hemoptysis: Secondary | ICD-10-CM

## 2013-09-20 HISTORY — PX: ESOPHAGOGASTRODUODENOSCOPY: SHX5428

## 2013-09-20 LAB — CBC
HEMATOCRIT: 38.5 % — AB (ref 39.0–52.0)
HEMOGLOBIN: 12.2 g/dL — AB (ref 13.0–17.0)
MCH: 27.7 pg (ref 26.0–34.0)
MCHC: 31.7 g/dL (ref 30.0–36.0)
MCV: 87.5 fL (ref 78.0–100.0)
Platelets: 173 10*3/uL (ref 150–400)
RBC: 4.4 MIL/uL (ref 4.22–5.81)
RDW: 13.9 % (ref 11.5–15.5)
WBC: 6.3 10*3/uL (ref 4.0–10.5)

## 2013-09-20 LAB — URINALYSIS, ROUTINE W REFLEX MICROSCOPIC
BILIRUBIN URINE: NEGATIVE
Glucose, UA: NEGATIVE mg/dL
Hgb urine dipstick: NEGATIVE
KETONES UR: NEGATIVE mg/dL
Leukocytes, UA: NEGATIVE
Nitrite: NEGATIVE
PROTEIN: NEGATIVE mg/dL
Specific Gravity, Urine: 1.016 (ref 1.005–1.030)
UROBILINOGEN UA: 1 mg/dL (ref 0.0–1.0)
pH: 6 (ref 5.0–8.0)

## 2013-09-20 LAB — BASIC METABOLIC PANEL
BUN: 16 mg/dL (ref 6–23)
CO2: 32 mEq/L (ref 19–32)
Calcium: 8.9 mg/dL (ref 8.4–10.5)
Chloride: 104 mEq/L (ref 96–112)
Creatinine, Ser: 1.2 mg/dL (ref 0.50–1.35)
GFR calc Af Amer: 63 mL/min — ABNORMAL LOW (ref >90)
GFR calc non Af Amer: 54 mL/min — ABNORMAL LOW (ref >90)
GLUCOSE: 133 mg/dL — AB (ref 70–99)
POTASSIUM: 4.5 meq/L (ref 3.7–5.3)
Sodium: 143 mEq/L (ref 137–147)

## 2013-09-20 LAB — GLUCOSE, CAPILLARY
Glucose-Capillary: 106 mg/dL — ABNORMAL HIGH (ref 70–99)
Glucose-Capillary: 113 mg/dL — ABNORMAL HIGH (ref 70–99)
Glucose-Capillary: 132 mg/dL — ABNORMAL HIGH (ref 70–99)
Glucose-Capillary: 217 mg/dL — ABNORMAL HIGH (ref 70–99)
Glucose-Capillary: 130 mg/dL — ABNORMAL HIGH (ref 70–99)

## 2013-09-20 SURGERY — EGD (ESOPHAGOGASTRODUODENOSCOPY)
Anesthesia: Moderate Sedation

## 2013-09-20 MED ORDER — MIDAZOLAM HCL 10 MG/2ML IJ SOLN
INTRAMUSCULAR | Status: DC | PRN
  Administered 2013-09-20 (×2): 1 mg via INTRAVENOUS

## 2013-09-20 MED ORDER — SODIUM CHLORIDE 0.9 % IV SOLN: INTRAVENOUS | Status: DC

## 2013-09-20 MED ORDER — ACETAMINOPHEN 325 MG PO TABS
650.0000 mg | ORAL_TABLET | Freq: Four times a day (QID) | ORAL | Status: DC | PRN
  Administered 2013-09-20: 650 mg via ORAL
  Filled 2013-09-20: qty 2

## 2013-09-20 MED ORDER — FENTANYL CITRATE 0.05 MG/ML IJ SOLN
INTRAMUSCULAR | Status: AC
  Filled 2013-09-20: qty 2

## 2013-09-20 MED ORDER — PANTOPRAZOLE SODIUM 40 MG PO TBEC
40.0000 mg | DELAYED_RELEASE_TABLET | Freq: Every day | ORAL | Status: DC
  Administered 2013-09-20 – 2013-09-21 (×2): 40 mg via ORAL
  Filled 2013-09-20: qty 1

## 2013-09-20 MED ORDER — MIDAZOLAM HCL 5 MG/ML IJ SOLN
INTRAMUSCULAR | Status: AC
  Filled 2013-09-20: qty 1

## 2013-09-20 MED ORDER — SENNOSIDES-DOCUSATE SODIUM 8.6-50 MG PO TABS
2.0000 | ORAL_TABLET | Freq: Every day | ORAL | Status: DC
  Administered 2013-09-20: 2 via ORAL
  Filled 2013-09-20: qty 2

## 2013-09-20 MED ORDER — BUTAMBEN-TETRACAINE-BENZOCAINE 2-2-14 % EX AERO
INHALATION_SPRAY | CUTANEOUS | Status: DC | PRN
  Administered 2013-09-20: 2 via TOPICAL

## 2013-09-20 MED ORDER — FENTANYL CITRATE 0.05 MG/ML IJ SOLN
INTRAMUSCULAR | Status: DC | PRN
  Administered 2013-09-20: 25 ug via INTRAVENOUS

## 2013-09-20 NOTE — Progress Notes (Signed)
PROGRESS NOTE  Jose Kennedy AYT:[id number] DOB: 10-22-1927 DOA: 09/19/2013 PCP:  Sydnee Levans [phone] Alfonzo Beers, [place]   Jose Kennedy is a 80 y.o. male with a history of coronary artery disease, hypertension, GERD, diabetes mellitus, esophagitis and 2013 that presents to the emergency room for complaint of coffee-ground emesis. Patient states has been going on for couple of days and has gradually worsened. He is also complained of dark tarry stools. He does take iron. Patient has had endoscopy in 2013 as well as one in May of 2015. Patient states he has been compliant with medication including his Carafate and proton pump inhibitor. He has seemed to help his acid reflux at this time or his emesis. Patient denies any shortness of breath, chest pain, abdominal pain   Assessment/Plan: GI bleed/hematemesis  No further vomiting after admission. Multiple gastric polyps seen in EGD today by Dr. Watt Climes.   Biopsies obtained and will need to be followed. Continue PPI.   Holding aspirin for now was on it for history of stroke. Will advance diet today.  If no vomiting will discharge in the care of his family tomorrow.  Hypertension  Continue losartan, hydralazine, HCTZ, Coreg, Norvasc  Stable.  Hyperlipidemia  Continue statin   Diabetes mellitus  Patient 70/30 home insulin on hold, continue insulin sliding scale with CBG monitoring  Last hemoglobin A1c 6.4 (07/30/2013)   GERD  Continue PPI  Hiatal hernia on EGD.  History laryngeal paralysis  Stable  CKD Stable. Creatinine at baseline.    DVT Prophylaxis:  SCDs  Code Status: full Family Communication: Son Barrett Goldie at bedside. Disposition Plan: to home with daughter likely 5/28 if no further vomiting.   Consultants:  [facility]  Procedures:  Egd 5/27  Antibiotics: Anti-infectives   None        HPI/Subjective: Patient very pleasant with no complaints.    Objective: Filed Vitals:   09/20/13 0850 09/20/13 0855  09/20/13 0900 09/20/13 0905  BP: 158/40 136/114 123/56   Pulse: 73 69 69 66  Temp:      TempSrc:      Resp: 20 21 15 12   Height:      Weight:      SpO2: 98% 99% 97% 93%    Intake/Output Summary (Last 24 hours) at 09/20/13 1125 Last data filed at 09/20/13 1005  Gross per 24 hour  Intake    702 ml  Output   2650 ml  Net  -1948 ml   Filed Weights   09/19/13 0447 09/19/13 0807  Weight: 81.647 kg (180 lb) 83.2 kg (183 lb 6.8 oz)    Exam: General: Well developed, well nourished, NAD, perhaps mild MR.   HEENT:  Anicteic Sclera, MMM. No pharyngeal erythema or exudates.  Larynx spasms when attempting to speak. Neck: Supple, no JVD, no masses  Cardiovascular: RRR, S1 S2 auscultated, no rubs, murmurs or gallops.   Respiratory: Clear to auscultation bilaterally with equal chest rise  Abdomen: Soft, nontender, nondistended, + bowel sounds  Extremities: warm dry without cyanosis clubbing or edema.  Neuro: AAOx3, cranial nerves grossly intact. Strength 5/5 in upper and lower extremities  Skin: Without rashes exudates or nodules.          Data Reviewed: Basic Metabolic Panel:  Recent Labs Lab 09/19/13 0445 09/20/13 0940  NA 137 143  K 3.9 4.5  CL 96 104  CO2 30 32  GLUCOSE 141* 133*  BUN 29* 16  CREATININE 1.25 1.20  CALCIUM 9.1 8.9   Liver  Function Tests:  Recent Labs Lab 09/19/13 0445  AST 21  ALT 18  ALKPHOS 47  BILITOT 0.4  PROT 6.3  ALBUMIN 3.3*   CBC:  Recent Labs Lab 09/19/13 0445 09/19/13 1020 09/19/13 2052 09/20/13 0940  WBC 6.8  --   --  6.3  NEUTROABS 4.4  --   --   --   HGB 13.0 13.2 12.2* 12.2*  HCT 41.4 41.6 37.9* 38.5*  MCV 87.2  --   --  87.5  PLT 184  --   --  173   CBG:  Recent Labs Lab 09/19/13 1608 09/19/13 1757 09/19/13 2144 09/20/13 0725 09/20/13 0751  GLUCAP 175* 145* 126* 106* 113*      Studies: Dg Chest 1 View  09/19/2013   CLINICAL DATA:  Hemoptysis versus hematemesis  EXAM: CHEST - 1 VIEW  COMPARISON:   07/28/2013  FINDINGS: The heart size and mediastinal contours are within normal limits. Both lungs are clear. The visualized skeletal structures are unremarkable.  IMPRESSION: No active disease.   Electronically Signed   By: Franchot Gallo M.D.   On: 09/19/2013 09:33    Scheduled Meds: . amLODipine  10 mg Oral Daily  . atorvastatin  40 mg Oral q1800  . carvedilol  12.5 mg Oral BID WC  . hydrALAZINE  10 mg Oral TID  . insulin aspart  0-9 Units Subcutaneous TID WC  . losartan  100 mg Oral Daily  . pantoprazole  40 mg Oral Daily  . senna-docusate  2 tablet Oral QHS   Continuous Infusions:   Principal Problem:   GI bleed Active Problems:   Hypertension   GERD (gastroesophageal reflux disease)   Hematemesis   Diabetes mellitus   CKD (chronic kidney disease) stage 3, GFR 30-59 ml/min    Melton Alar, PA-C  [facility] Pager [phone]. If 7PM-7AM, please contact night-coverage at [email or web address], password [facility] 09/20/2013, 11:25 AM  LOS: 1 day

## 2013-09-20 NOTE — Progress Notes (Signed)
  I have directly reviewed the clinical findings, lab, imaging studies and management of this patient in detail. I have interviewed and examined the patient and agree with the documentation,  as recorded by the Physician extender.  Thurnell Lose M.D on 09/20/2013 at 2:21 PM  [facility] Office  [phone]

## 2013-09-20 NOTE — Progress Notes (Signed)
RN explained to Endo that pt still needs to sign consent. MD spoke with family but not patient about procedure this am. Informed Endo that patient is alert and oriented. Consent placed at the front of patient chart.

## 2013-09-20 NOTE — Progress Notes (Signed)
Jose Kennedy 8:14 AM  Subjective: Patient doing well today without complaints and no signs of bleeding  Objective: Vital signs stable afebrile no acute distress exam please see pre-assessment evaluation hemoglobin minimally decreased  Assessment: Upper GI bleeding  Plan: Okay to proceed with endoscopy and moderate sedation and the procedure was rediscussed with the patient  Jose Kennedy

## 2013-09-20 NOTE — Progress Notes (Signed)
UR completed 

## 2013-09-20 NOTE — Op Note (Signed)
[facility] [name], [address]   ENDOSCOPY PROCEDURE REPORT  PATIENT: Jose Kennedy, Jose Kennedy  MR#: [id number] BIRTHDATE: [birth year] , 44  yrs. old GENDER: Male  ENDOSCOPIST: Clarene Essex, MD REFERRED BY:  PROCEDURE DATE:  09/20/2013 PROCEDURE:   EGD w/ biopsy ASA CLASS:   Class III INDICATIONS:Hematemesis.  MEDICATIONS: Fentanyl 25 mcg IV and Versed 2 mg IV  TOPICAL ANESTHETIC:used  DESCRIPTION OF PROCEDURE:   After the risks benefits and alternatives of the procedure were thoroughly explained, informed consent was obtained.  The Pentax Gastroscope [id number]  endoscope was introduced through the mouth and advanced to the second portion of the duodenum , limited by Without limitations.   The instrument was slowly withdrawn as the mucosa was fully examined.the findings are recorded below and actually one of the polyps in the mid body lesser curve seemed a little hemorrhagic but no active bleeding was seen and the patient tolerated the procedure well there was no obvious immediate complication         FINDINGS:1. Small hiatal hernia with mild distal esophagitis and probably some esophageal dysmotility and possible increased GE junction spasm 2. Multiple gastric polyps the largest in the cardia biopsied and put in the second container 3 the 2 larger mid body 1 mentioned above biopsied and put in the first container 4. Smaller ones not biopsied 5. Otherwise within normal limits EGD  COMPLICATIONS:none  ENDOSCOPIC IMPRESSION: above   RECOMMENDATIONS:advance diet hopefully he can home soon continue pump inhibitor await pathology to determine whether more aggressive polypectomy is needed or consider that if signs of bleeding continue and would reevaluate his aspirin needs and I would be happy to see back as needed   REPEAT EXAM: as needed   _______________________________ Clarene Essex, MD eSigned:  Clarene Essex, MD 09/20/2013 8:55  AM    CC:  PATIENT NAME:  Jose Kennedy, Jose Kennedy MR#: [id number]

## 2013-09-21 ENCOUNTER — Encounter (HOSPITAL_COMMUNITY): Payer: Self-pay | Admitting: Gastroenterology

## 2013-09-21 ENCOUNTER — Observation Stay (HOSPITAL_COMMUNITY): Payer: Medicare Other

## 2013-09-21 DIAGNOSIS — Y844 Aspiration of fluid as the cause of abnormal reaction of the patient, or of later complication, without mention of misadventure at the time of the procedure: Secondary | ICD-10-CM | POA: Diagnosis present

## 2013-09-21 DIAGNOSIS — K920 Hematemesis: Secondary | ICD-10-CM | POA: Diagnosis not present

## 2013-09-21 LAB — CBC
HCT: 37.3 % — ABNORMAL LOW (ref 39.0–52.0)
Hemoglobin: 11.7 g/dL — ABNORMAL LOW (ref 13.0–17.0)
MCH: 27.5 pg (ref 26.0–34.0)
MCHC: 31.4 g/dL (ref 30.0–36.0)
MCV: 87.6 fL (ref 78.0–100.0)
PLATELETS: 174 10*3/uL (ref 150–400)
RBC: 4.26 MIL/uL (ref 4.22–5.81)
RDW: 14.2 % (ref 11.5–15.5)
WBC: 10.9 10*3/uL — ABNORMAL HIGH (ref 4.0–10.5)

## 2013-09-21 LAB — BASIC METABOLIC PANEL
BUN: 21 mg/dL (ref 6–23)
CALCIUM: 9.1 mg/dL (ref 8.4–10.5)
CO2: 32 meq/L (ref 19–32)
Chloride: 103 mEq/L (ref 96–112)
Creatinine, Ser: 1.22 mg/dL (ref 0.50–1.35)
GFR calc Af Amer: 61 mL/min — ABNORMAL LOW (ref >90)
GFR calc non Af Amer: 53 mL/min — ABNORMAL LOW (ref >90)
GLUCOSE: 139 mg/dL — AB (ref 70–99)
Potassium: 4 mEq/L (ref 3.7–5.3)
Sodium: 143 mEq/L (ref 137–147)

## 2013-09-21 LAB — GLUCOSE, CAPILLARY
GLUCOSE-CAPILLARY: 244 mg/dL — AB (ref 70–99)
GLUCOSE-CAPILLARY: 174 mg/dL — AB (ref 70–99)
Glucose-Capillary: 134 mg/dL — ABNORMAL HIGH (ref 70–99)

## 2013-09-21 MED ORDER — MAGNESIUM HYDROXIDE 400 MG/5ML PO SUSP
30.0000 mL | Freq: Two times a day (BID) | ORAL | Status: DC
Start: 2013-09-21 — End: 2013-09-22
  Administered 2013-09-21: 30 mL via ORAL
  Filled 2013-09-21: qty 30

## 2013-09-21 MED ORDER — BISACODYL 10 MG RE SUPP
10.0000 mg | Freq: Once | RECTAL | Status: AC
  Administered 2013-09-21: 10 mg via RECTAL
  Filled 2013-09-21: qty 1

## 2013-09-21 MED ORDER — SODIUM CHLORIDE 0.9 % IV SOLN
1.5000 g | Freq: Four times a day (QID) | INTRAVENOUS | Status: DC
  Administered 2013-09-21 (×2): 1.5 g via INTRAVENOUS
  Filled 2013-09-21 (×4): qty 1.5

## 2013-09-21 MED ORDER — AMOXICILLIN-POT CLAVULANATE 875-125 MG PO TABS
1.0000 | ORAL_TABLET | Freq: Two times a day (BID) | ORAL | Status: DC
Start: 2013-09-21 — End: 2013-09-21

## 2013-09-21 MED ORDER — AMOXICILLIN-POT CLAVULANATE 875-125 MG PO TABS: 1.0000 | ORAL_TABLET | Freq: Two times a day (BID) | ORAL | Status: DC

## 2013-09-21 NOTE — Procedures (Signed)
Objective Swallowing Evaluation: Modified Barium Swallowing Study  Patient Details  Name: Jose Kennedy: [id number] Date of Birth: 03/12/1928  Today's Date: 09/21/2013 Time: 1355-1410 SLP Time Calculation (min): 15 min  Past Medical History:  Past Medical History  Diagnosis Date  . Stroke   . Coronary artery disease   . Hypertension   . GERD (gastroesophageal reflux disease)   . History of stomach ulcers   . Heart murmur   . Diabetes mellitus     insulin dependent   Past Surgical History:  Past Surgical History  Procedure Laterality Date  . Laparoscopic cholecystectomy    . Back surgery    . Thyroid nodual    . Appendectomy    . Esophagogastroduodenoscopy  08/26/2011    Procedure: ESOPHAGOGASTRODUODENOSCOPY (EGD);  Surgeon: Wonda Horner, MD;  Location: [facility] ENDOSCOPY;  Service: Endoscopy;  Laterality: N/A;  . Esophagogastroduodenoscopy N/A 09/20/2013    Procedure: ESOPHAGOGASTRODUODENOSCOPY (EGD);  Surgeon: Jeryl Columbia, MD;  Location: [facility] ENDOSCOPY;  Service: Endoscopy;  Laterality: N/A;   HPI:  Jose Kennedy is a 80 y.o. male       Assessment / Plan / Recommendation Clinical Impression  Dysphagia Diagnosis: Suspected primary esophageal dysphagia Clinical impression: Patient presents with a suspected primary esophageal dysphagia characterized by a functional oropharyngeal swallow with intermittent flash penetration of thin liquids normal for age but with intermittent significant backflow of bolus from esophagus into pharyngeal and oral cavity post swallow coinciding with patient c/o chest tightness, difficulty speaking, catching breath, etc. Patient continued to be able to protect airway during these episodes however cannot r/o intermittent aspiration based on above. Educated patient on general esophageal precautions and contacted MD who reported that this was likely baseline swallowing function. Noted hiatal hernia and esophageal dysmotility noted on most recent EGD likely  contributing. Patient discharging today. No SLP f/u indicated at this time,     Treatment Recommendation  No treatment recommended at this time    Diet Recommendation Dysphagia 3 (Mechanical Soft);Thin liquid   Liquid Administration via: Cup;Straw Medication Administration: Whole meds with liquid Supervision: Patient able to self feed;Intermittent supervision to cue for compensatory strategies Compensations: Slow rate;Small sips/bites;Multiple dry swallows after each bite/sip;Follow solids with liquid Postural Changes and/or Swallow Maneuvers: Out of bed for meals;Seated upright 90 degrees;Upright 30-60 min after meal    Other  Recommendations Oral Care Recommendations: Oral care BID   Follow Up Recommendations  None    Frequency and Duration  (to be determined)   (to be determined)        General Date of Onset: 09/19/13 (history of stroke with long term esophageal motility issues) HPI: Jose Kennedy is a 80 y.o. male   Type of Study: Modified Barium Swallowing Study Reason for Referral: Objectively evaluate swallowing function Previous Swallow Assessment: MBS 01/09/2011, penetration/aspiration of thin and nectar thick liquids Diet Prior to this Study: Dysphagia 3 (soft);Thin liquids Temperature Spikes Noted: Yes Respiratory Status: Room air History of Recent Intubation: No Behavior/Cognition: Alert;Cooperative;Pleasant mood Oral Cavity - Dentition: Dentures, top;Dentures, bottom Oral Motor / Sensory Function: Within functional limits Self-Feeding Abilities: Able to feed self Patient Positioning: Upright in chair Baseline Vocal Quality: Clear Volitional Cough: Strong Volitional Swallow: Able to elicit Anatomy: Within functional limits Pharyngeal Secretions: Not observed secondary MBS    Reason for Referral Objectively evaluate swallowing function   Oral Phase Oral Preparation/Oral Phase Oral Phase: WFL   Pharyngeal Phase Pharyngeal Phase Pharyngeal Phase: Within  functional limits  Cervical Esophageal Phase  GO    Cervical Esophageal Phase Cervical Esophageal Phase: WFL (see impression statement for ? lower esophageal deficits)    Functional Assessment Tool Used: skilled clinical judgement Functional Limitations: Swallowing Swallow Current Status (O2703): At least 1 percent but less than 20 percent impaired, limited or restricted Swallow Goal Status [phone]): At least 1 percent but less than 20 percent impaired, limited or restricted Swallow Discharge Status [phone]): At least 1 percent but less than 20 percent impaired, limited or restricted   [place], CCC-SLP [phone]  Denny Peon Meryl Ellawyn Wogan 09/21/2013, 3:30 PM

## 2013-09-21 NOTE — Discharge Summary (Signed)
  I have directly reviewed the clinical findings, lab, imaging studies and management of this patient in detail. I have interviewed and examined the patient and agree with the documentation,  as recorded by the Physician extender.  Thurnell Lose M.D on 09/21/2013 at 3:03 PM  [facility] Office  [phone]

## 2013-09-21 NOTE — Discharge Summary (Signed)
Physician Discharge Summary  Kyaire Gruenewald ZWC:[id number] DOB: [birth year] DOA: 09/19/2013  PCP: Pcp Not In System  Admit date: 09/19/2013 Discharge date: 09/21/2013  Time spent: 50 minutes  Recommendations for Outpatient Follow-up:  1. Home Health Speech Therapy.  Dysphagia 3 diet (soft diet) 2. Follow up CBC and Chest Xray in 10 days 3. Augmentin x 7 days for right lower lobe infiltrate. 4. Follow up with Dr. Clarene Essex for biopsy results in 1- 2 weeks.  Hold aspirin until discussion with Gastroenterology.      (On aspirin for stroke)  Discharge Diagnoses:  Principal Problem:   GI bleed Active Problems:   Hypertension   GERD (gastroesophageal reflux disease)   Hematemesis   Diabetes mellitus   CKD (chronic kidney disease) stage 3, GFR 30-59 ml/min   Aspiration of fluid as cause of abnormal reaction of patient or of later complication without misadventure at time of procedure   Discharge Condition: stable   Diet recommendation: soft diet, aspiration precautions.  Filed Weights   09/19/13 0447 09/19/13 0807  Weight: 81.647 kg (180 lb) 83.2 kg (183 lb 6.8 oz)    History of present illness:  Lori Liew is a 80 y.o. male with a history of coronary artery disease, hypertension, GERD, diabetes mellitus, esophagitis, that presents to the emergency room for complaint of coffee-ground emesis. Patient states this has been going on for couple of days and has gradually worsened. He is also complained of dark tarry stools. He does take iron. Patient has had endoscopy in 2013 as well as one in May of 2015. Patient states he has been compliant with medication including his Carafate and proton pump inhibitor. He has seemed to help his acid reflux at this time or his emesis. Patient denies any shortness of breath, chest pain, abdominal pain.   Hospital Course:  GI bleed/hematemesis  No further vomiting after admission.  Multiple gastric polyps seen in EGD 5/27 by Dr. Watt Climes. Biopsies obtained  and were negative for malignancy and H pylori.  Continue PPI. Holding aspirin for now was on it for history of stroke.  Diet was advanced to soft solids.  Hypertension  Continue losartan, hydralazine, HCTZ, Coreg, Norvasc  Stable.   Hyperlipidemia  Continue statin   Diabetes mellitus  Patient 70/30 home insulin held while inpatient.  Will resume same regimen on discharge.  Last hemoglobin A1c 6.4 (07/30/2013)   GERD  Continue PPI  Hiatal hernia on EGD.   History laryngeal paralysis  Stable.  Right Lower Lobe Infiltrate. Likely aspiration pneumonia that appeared after upper endoscopy. Mr. Welter ran a low grade fever prompting a chest xray.  RLL infiltrate observed on CXR. Patient given a dose of IV unasyn as an inpatient, and will be discharged on 7 days of Augmentin. Patient was evaluated by speech therapy who recommend [facility] Speech Therapy and a soft diet.  Aspiration Speech evaluation was conducted on 5/28. Results:  Pt presents with overt s/s of aspiration with regular solids and mixed regular solid and thin liquid consistencies characterized by a delayed cough. No overt s/s of aspiration were noted with ice chips, thin liquids alone, or purees. Modified Barium Swallow Study was conducted.  D3 (soft) diet recommended.   CKD  Stable.  Creatinine at baseline.      Procedures:  Upper Endoscopy.     Discharge Exam: Filed Vitals:   09/21/13 1430  BP: 124/69  Pulse: 59  Temp: 99.6 F (37.6 C)  Resp: 20   General: Well developed, well nourished,  NAD, perhaps mild MR.  HEENT: Anicteic Sclera, MMM. No pharyngeal erythema or exudates. Larynx spasms when attempting to speak.  Neck: Supple, no JVD, no masses  Cardiovascular: RRR, S1 S2 auscultated, no rubs, murmurs or gallops.  Respiratory: Clear to auscultation bilaterally with equal chest rise  Abdomen: Soft, nontender, nondistended, + bowel sounds  Extremities: warm dry without cyanosis clubbing or edema.  Neuro:  AAOx3, cranial nerves grossly intact. Strength 5/5 in upper and lower extremities  Skin: Without rashes exudates or nodules.     Discharge Instructions .      Discharge Instructions   Diet - low sodium heart healthy    Complete by:  As directed      Diet general    Complete by:  As directed   Soft diet.     Increase activity slowly    Complete by:  As directed      Increase activity slowly    Complete by:  As directed             Medication List    STOP taking these medications       aspirin 81 MG tablet      TAKE these medications       amLODipine 10 MG tablet  Commonly known as:  NORVASC  Take 10 mg by mouth daily.     amoxicillin-clavulanate 875-125 MG per tablet  Commonly known as:  AUGMENTIN  Take 1 tablet by mouth 2 (two) times daily.     carvedilol 12.5 MG tablet  Commonly known as:  COREG  Take 1 tablet (12.5 mg total) by mouth 2 (two) times daily with a meal.     CENTRUM SILVER PO  Take 1 tablet by mouth once a week.     docusate sodium 100 MG capsule  Commonly known as:  COLACE  Take 100 mg by mouth 2 (two) times daily.     ferrous sulfate 325 (65 FE) MG tablet  Take 325 mg by mouth 2 (two) times daily.     hydrALAZINE 10 MG tablet  Commonly known as:  APRESOLINE  Take 10 mg by mouth 3 (three) times daily.     hydrochlorothiazide 25 MG tablet  Commonly known as:  HYDRODIURIL  Take 25 mg by mouth daily.     insulin NPH-regular Human (70-30) 100 UNIT/ML injection  Commonly known as:  NOVOLIN 70/30  Inject 10 Units into the skin 2 (two) times daily with a meal.     latanoprost 0.005 % ophthalmic solution  Commonly known as:  XALATAN  Place 1 drop into both eyes at bedtime.     losartan 100 MG tablet  Commonly known as:  COZAAR  Take 100 mg by mouth daily.     Omeprazole-Sodium Bicarbonate 20-1100 MG Caps capsule  Commonly known as:  ZEGERID  Take 1 capsule by mouth daily before breakfast.     simvastatin 80 MG tablet  Commonly  known as:  ZOCOR  Take 40 mg by mouth at bedtime.     sucralfate 1 G tablet  Commonly known as:  CARAFATE  Take 1 g by mouth 4 (four) times daily.       No Known Allergies Follow-up Information   Follow up with [facility], MD In 2 weeks.   Specialty:  Gastroenterology   Contact information:   [address], [place] [address] [phone]        The results of significant diagnostics from this hospitalization (including imaging,  microbiology, ancillary and laboratory) are listed below for reference.    Significant Diagnostic Studies:  Surgical Pathology 1. Stomach, polyp(s), Mid and distal - FRAGMENTS OF ERODED GASTRIC HYPERPLASTIC POLYP - NEGATIVE FOR INTESTINAL METAPLASIA, DYSPLASIA, OR MALIGNANCY. - WARTHIN-STARRY STAIN NEGATIVE FOR H. PYLORI. 2. Stomach, polyp(s), Proximal - FRAGMENTS OF GASTRIC HYPERPLASTIC POLYP. - NEGATIVE FOR INTESTINAL METAPLASIA, DYSPLASIA, OR MALIGNANCY. - NEGATIVE FOR H. PYLORI.   Dg Chest 1 View  09/19/2013   CLINICAL DATA:  Hemoptysis versus hematemesis  EXAM: CHEST - 1 VIEW  COMPARISON:  07/28/2013  FINDINGS: The heart size and mediastinal contours are within normal limits. Both lungs are clear. The visualized skeletal structures are unremarkable.  IMPRESSION: No active disease.   Electronically Signed   By: Franchot Gallo M.D.   On: 09/19/2013 09:33   Dg Chest 2 View  09/21/2013   CLINICAL DATA:  Fever  EXAM: CHEST  2 VIEW  COMPARISON:  09/19/2013  FINDINGS: Cardiac shadow is stable. The lungs are well aerated bilaterally. Tortuosity of the thoracic aorta is noted. The lungs are well-aerated bilaterally. Mild increased density is noted in the left lung base which may represent atelectasis and/or early infiltrate. Degenerative changes of the thoracic spine are noted.  IMPRESSION: Early changes in the left lung base which projects in the left lower lobe. This may represent acute infiltrate given the patient's clinical history.    Electronically Signed   By: Inez Catalina M.D.   On: 09/21/2013 08:58    Labs: Basic Metabolic Panel:  Recent Labs Lab 09/19/13 0445 09/20/13 0940 09/21/13 0548  NA 137 143 143  K 3.9 4.5 4.0  CL 96 104 103  CO2 30 32 32  GLUCOSE 141* 133* 139*  BUN 29* 16 21  CREATININE 1.25 1.20 1.22  CALCIUM 9.1 8.9 9.1   Liver Function Tests:  Recent Labs Lab 09/19/13 0445  AST 21  ALT 18  ALKPHOS 47  BILITOT 0.4  PROT 6.3  ALBUMIN 3.3*   CBC:  Recent Labs Lab 09/19/13 0445 09/19/13 1020 09/19/13 2052 09/20/13 0940 09/21/13 0548  WBC 6.8  --   --  6.3 10.9*  NEUTROABS 4.4  --   --   --   --   HGB 13.0 13.2 12.2* 12.2* 11.7*  HCT 41.4 41.6 37.9* 38.5* 37.3*  MCV 87.2  --   --  87.5 87.6  PLT 184  --   --  173 174   CBG:  Recent Labs Lab 09/20/13 1218 09/20/13 1630 09/20/13 2154 09/21/13 0801 09/21/13 1135  GLUCAP 132* 217* 130* 134* 244*       Signed:  Karen Kitchens [phone]  [facility] 09/21/2013, 2:47 PM

## 2013-09-21 NOTE — Progress Notes (Signed)
Discharge instructions given to patient & daughter,including activity,diet,follow up appts.& meds.Patient & daughter verbalized understanding on all d/c instructions.IV site dcd.Skin is intact.Pt.to be escorted out by Tammy NT & to be driven home by daughter & son in law.

## 2013-09-21 NOTE — Discharge Instructions (Signed)
·   Follow up with your primary care doctor in 1 week for blood work (CBC check for anemia)  Follow up with Dr. Watt Climes for Biopsy results in 1-2 weeks.  Do not take aspirin until you have discussed it with Dr. Perley Jain office.  Repeat CBC, BMP, 2 view CXR by PCP in 7-10 days  Dysphagia 3 diet as instructed by speech therapy

## 2013-09-21 NOTE — Care Management Note (Signed)
    Page 1 of 1   09/21/2013     4:07:05 PM CARE MANAGEMENT NOTE 09/21/2013  Patient:  Jose Kennedy, Jose Kennedy   Account Number:  [id number]  Date Initiated:  09/21/2013  Documentation initiated by:  Tomi Bamberger  Subjective/Objective Assessment:   dx gib, hemoptysis  admit- lives with daughter.     Action/Plan:   hh speech eval   Anticipated DC Date:  09/21/2013   Anticipated DC Plan:  [place]  CM consult      [facility] Choice  HOME HEALTH   Choice offered to / List presented to:  C-1 Patient        [place] arranged  [place]   Status of service:  Completed, signed off Medicare Important Message given?  YES (If response is "NO", the following Medicare IM given date fields will be blank) Date Medicare IM given:  09/19/2013 Date Additional Medicare IM given:    Discharge Disposition:  [place]  Per UR Regulation:  Reviewed for med. necessity/level of care/duration of stay  If discussed at [place] of Stay Meetings, dates discussed:    Comments:  09/21/13 [place], BSN [phone] patient lives with daughter, patient chose [facility], referral faxed to [place], Soc will begin 24-48 hrs post dc.

## 2013-09-21 NOTE — Progress Notes (Signed)
Jose Kennedy 5:38 PM  Subjective: Patient without any signs of bleeding and no post EGD problems and we reviewed his findings  Objective: Vital signs stable afebrile no acute distress abdomen is soft nontender hemoglobin stable pathology pending  Assessment: Multiple gastric polyps and esophageal dysmotility  Plan: Okay with me to go home and happy to see back when necessary and continue pump inhibitors at home and he might benefit from a trial of injection of Botox at the GE junction and we could do that if we need to proceed with endoscopy and polypectomy based on pathology and you  will need to reevaluate whether he truly needs aspirin or not which would decrease the risk of bleeding  Jeryl Columbia

## 2013-09-21 NOTE — Evaluation (Signed)
Clinical/Bedside Swallow Evaluation Patient Details  Name: Jose Kennedy MRN: [id number] Date of Birth: 1928-04-22  Today's Date: 09/21/2013 Time: 1030-1050 SLP Time Calculation (min): 20 min  Past Medical History:  Past Medical History  Diagnosis Date  . Stroke   . Coronary artery disease   . Hypertension   . GERD (gastroesophageal reflux disease)   . History of stomach ulcers   . Heart murmur   . Diabetes mellitus     insulin dependent   Past Surgical History:  Past Surgical History  Procedure Laterality Date  . Laparoscopic cholecystectomy    . Back surgery    . Thyroid nodual    . Appendectomy    . Esophagogastroduodenoscopy  08/26/2011    Procedure: ESOPHAGOGASTRODUODENOSCOPY (EGD);  Surgeon: Wonda Horner, MD;  Location: [facility] ENDOSCOPY;  Service: Endoscopy;  Laterality: N/A;  . Esophagogastroduodenoscopy N/A 09/20/2013    Procedure: ESOPHAGOGASTRODUODENOSCOPY (EGD);  Surgeon: Jeryl Columbia, MD;  Location: [facility] ENDOSCOPY;  Service: Endoscopy;  Laterality: N/A;   HPI:  Jose Kennedy is a 80 y.o. male   with a history of coronary artery disease, hypertension, GERD, diabetes mellitus, esophagitis and 2013 that presents to the emergency room for complaint of coffee-ground emesis.  Patient has had endoscopy in 2013 as well as one in May of 2015.  Assessment / Plan / Recommendation Clinical Impression  Pt presents with overt s/s of aspiration with regular solids and mixed regular solid and thin liquid consistencies characterized by a delayed cough.  No overt s/s of aspiration were noted with ice chips, thin liquids alone, or purees. Mild oral phase impairments were noted with regular solids characterized by impaired mastication and anterior-posterior propulsion of boluses which are most likely related to pt's loose fitting dentures and the presence of what appears to be esophageal dysfunction during PO.   Additionally, following PO trials, pt was noted with belching and reports of  regurgitation or "food coming back up."  Pt was noted with a strong cough after these periods of suspected reflux due to recently diagnosed hiatal hernia and history of esophagitis and it is likely that pt's reflux symptoms are impairing the timing and coordination of his pharyngeal swallow, thereby decreasing his airway protection during the swallow and putting him at an increased risk of aspirating either reflux or PO intake.  Recommend that pt continue on his currently prescribed dys. 3 diet with thin liquids and pt would also benefit from an objective swallow study to determine the safest diet consistency prior to discharge.  MBS scheduled for this afternoon.     Aspiration Risk  Moderate    Diet Recommendation Dysphagia 3 (Mechanical Soft)   Liquid Administration via: Cup;Straw Medication Administration: Whole meds with liquid Supervision: Patient able to self feed;Intermittent supervision to cue for compensatory strategies Compensations: Slow rate;Small sips/bites;Multiple dry swallows after each bite/sip Postural Changes and/or Swallow Maneuvers: Out of bed for meals;Seated upright 90 degrees;Upright 30-60 min after meal    Other  Recommendations Recommended Consults: MBS Oral Care Recommendations: Oral care BID Other Recommendations: Have oral suction available   Follow Up Recommendations  Other (comment) (to be determined pending results of MBS)    Frequency and Duration Other (Comment) (to be determined)  Other (Comment) (to be determined)   Pertinent Vitals/Pain n/a    SLP Swallow Goals    Pending MBS     Swallow Study    General Date of Onset: 09/19/13 (history of stroke with long term esophageal motility issues) HPI: Jose Kennedy is  a 80 y.o. male   Type of Study: Bedside swallow evaluation Previous Swallow Assessment: MBS 01/09/2011 Diet Prior to this Study: Dysphagia 3 (soft);Thin liquids Temperature Spikes Noted: Yes Respiratory Status: Room air History of Recent  Intubation: No Behavior/Cognition: Alert;Cooperative;Pleasant mood Oral Cavity - Dentition: Dentures, top;Dentures, bottom Self-Feeding Abilities: Able to feed self Patient Positioning: Upright in chair Baseline Vocal Quality: Clear Volitional Cough: Strong    Oral/Motor/Sensory Function Overall Oral Motor/Sensory Function: Appears within functional limits for tasks assessed Labial ROM: Within Functional Limits Labial Symmetry: Within Functional Limits Labial Strength: Within Functional Limits Lingual ROM: Within Functional Limits Lingual Symmetry: Within Functional Limits Lingual Strength: Within Functional Limits Facial ROM: Within Functional Limits Facial Symmetry: Within Functional Limits Facial Strength: Within Functional Limits   Ice Chips Ice chips: Within functional limits Presentation: Spoon   Thin Liquid Thin Liquid: Within functional limits Presentation: Cup;Straw    Nectar Thick Nectar Thick Liquid: Not tested   Honey Thick Honey Thick Liquid: Not tested   Puree Puree: Within functional limits Presentation: Spoon   Solid   GO Functional Assessment Tool Used: skilled clinical judgement Functional Limitations: Swallowing Swallow Current Status (G8185): At least 40 percent but less than 60 percent impaired, limited or restricted Swallow Goal Status [id number]): At least 20 percent but less than 40 percent impaired, limited or restricted  Solid: Impaired Presentation: Self Fed Oral Phase Impairments: Impaired mastication;Impaired anterior to posterior transit Pharyngeal Phase Impairments: Cough - Delayed (with mixed consistencies)       Windell Moulding, M.A. CCC-SLP  Jose Kennedy 09/21/2013,11:29 AM

## 2013-10-09 ENCOUNTER — Encounter: Payer: Self-pay | Admitting: Podiatry

## 2013-10-09 ENCOUNTER — Ambulatory Visit (INDEPENDENT_AMBULATORY_CARE_PROVIDER_SITE_OTHER): Payer: Medicare Other | Admitting: Podiatry

## 2013-10-09 VITALS — BP 137/76 | HR 67 | Resp 16 | Ht 71.0 in | Wt 185.0 lb

## 2013-10-09 DIAGNOSIS — M79609 Pain in unspecified limb: Secondary | ICD-10-CM

## 2013-10-09 DIAGNOSIS — B351 Tinea unguium: Secondary | ICD-10-CM

## 2013-10-09 NOTE — Progress Notes (Signed)
Patient ID: Jose Kennedy, male   DOB: 06-Dec-1927, 80 y.o.   MRN: [id number]  Subjective: This patient presents complaining of painful toenails  Objective: Discolored, hypertrophic, incurvated toenails x10  Assessment: Symptomatic onychomycoses x10  Plan: Nails x10 are debrided without a bleeding  Reappoint x3 months

## 2014-01-15 ENCOUNTER — Ambulatory Visit (INDEPENDENT_AMBULATORY_CARE_PROVIDER_SITE_OTHER): Payer: Medicare Other | Admitting: Podiatry

## 2014-01-15 ENCOUNTER — Ambulatory Visit: Payer: Medicare Other | Admitting: Podiatry

## 2014-01-15 DIAGNOSIS — B351 Tinea unguium: Secondary | ICD-10-CM | POA: Diagnosis not present

## 2014-01-15 DIAGNOSIS — M79609 Pain in unspecified limb: Secondary | ICD-10-CM

## 2014-01-15 DIAGNOSIS — M79673 Pain in unspecified foot: Secondary | ICD-10-CM

## 2014-01-15 NOTE — Progress Notes (Signed)
Subjective:     Patient ID: Jose Kennedy, male   DOB: 03/22/1928, 80 y.o.   MRN: [id number]  HPI patient presents with thick painful nailbeds 1-5 both feet that he cannot cut   Review of Systems     Objective:   Physical Exam Neurovascular status intact with thick yellow brittle nailbeds 1-5 both feet that are painful    Assessment:     Mycotic nail infection is with pain 1-5 both feet    Plan:     Debride painful nailbeds 1-5 both feet with no iatrogenic bleeding noted      

## 2014-01-15 NOTE — Progress Notes (Signed)
   Subjective:    Patient ID: Jose Kennedy, male    DOB: 04-23-1928, 80 y.o.   MRN: [id number]  HPI  Pt is here for nail debridement  Review of Systems     Objective:   Physical Exam        Assessment & Plan:

## 2014-04-16 ENCOUNTER — Ambulatory Visit (INDEPENDENT_AMBULATORY_CARE_PROVIDER_SITE_OTHER): Payer: Medicare Other | Admitting: Podiatry

## 2014-04-16 DIAGNOSIS — B351 Tinea unguium: Secondary | ICD-10-CM | POA: Diagnosis not present

## 2014-04-16 DIAGNOSIS — M79673 Pain in unspecified foot: Secondary | ICD-10-CM | POA: Diagnosis not present

## 2014-04-16 NOTE — Progress Notes (Signed)
Subjective:     Patient ID: Jose Kennedy, male   DOB: [birth year], 80 y.o.   MRN: [id number]  HPI patient presents with thick painful nailbeds 1-5 both feet that he cannot cut   Review of Systems     Objective:   Physical Exam Neurovascular status intact with thick yellow brittle nailbeds 1-5 both feet that are painful    Assessment:     Mycotic nail infection is with pain 1-5 both feet    Plan:     Debride painful nailbeds 1-5 both feet with no iatrogenic bleeding noted

## 2014-07-16 ENCOUNTER — Encounter: Payer: Self-pay | Admitting: Podiatry

## 2014-07-16 ENCOUNTER — Ambulatory Visit (INDEPENDENT_AMBULATORY_CARE_PROVIDER_SITE_OTHER): Payer: Medicare Other | Admitting: Podiatry

## 2014-07-16 DIAGNOSIS — M79673 Pain in unspecified foot: Secondary | ICD-10-CM

## 2014-07-16 DIAGNOSIS — B351 Tinea unguium: Secondary | ICD-10-CM | POA: Diagnosis not present

## 2014-07-16 NOTE — Progress Notes (Signed)
Subjective:     Patient ID: Jose Kennedy, male   DOB: 03/03/1928, 81 y.o.   MRN: [id number]  HPI patient presents with thick painful nailbeds 1-5 both feet that he cannot cut   Review of Systems     Objective:   Physical Exam Neurovascular status intact with thick yellow brittle nailbeds 1-5 both feet that are painful    Assessment:     Mycotic nail infection is with pain 1-5 both feet    Plan:     Debride painful nailbeds 1-5 both feet with no iatrogenic bleeding noted

## 2014-10-22 ENCOUNTER — Ambulatory Visit: Payer: Medicare Other | Admitting: Podiatry

## 2015-06-23 ENCOUNTER — Emergency Department (HOSPITAL_BASED_OUTPATIENT_CLINIC_OR_DEPARTMENT_OTHER): Payer: Medicare Other

## 2015-06-23 ENCOUNTER — Emergency Department (HOSPITAL_BASED_OUTPATIENT_CLINIC_OR_DEPARTMENT_OTHER)
Admission: EM | Admit: 2015-06-23 | Discharge: 2015-06-24 | Disposition: A | Discharge status: Home / Self Care | Payer: Medicare Other | Attending: Emergency Medicine | Admitting: Emergency Medicine

## 2015-06-23 ENCOUNTER — Encounter (HOSPITAL_BASED_OUTPATIENT_CLINIC_OR_DEPARTMENT_OTHER): Payer: Self-pay | Admitting: *Deleted

## 2015-06-23 DIAGNOSIS — Z79899 Other long term (current) drug therapy: Secondary | ICD-10-CM | POA: Diagnosis not present

## 2015-06-23 DIAGNOSIS — Z794 Long term (current) use of insulin: Secondary | ICD-10-CM | POA: Insufficient documentation

## 2015-06-23 DIAGNOSIS — R05 Cough: Secondary | ICD-10-CM | POA: Diagnosis present

## 2015-06-23 DIAGNOSIS — I2510 Atherosclerotic heart disease of native coronary artery without angina pectoris: Secondary | ICD-10-CM | POA: Diagnosis not present

## 2015-06-23 DIAGNOSIS — J209 Acute bronchitis, unspecified: Secondary | ICD-10-CM | POA: Diagnosis not present

## 2015-06-23 DIAGNOSIS — Z8673 Personal history of transient ischemic attack (TIA), and cerebral infarction without residual deficits: Secondary | ICD-10-CM | POA: Diagnosis not present

## 2015-06-23 DIAGNOSIS — R011 Cardiac murmur, unspecified: Secondary | ICD-10-CM | POA: Diagnosis not present

## 2015-06-23 DIAGNOSIS — E119 Type 2 diabetes mellitus without complications: Secondary | ICD-10-CM | POA: Insufficient documentation

## 2015-06-23 DIAGNOSIS — I10 Essential (primary) hypertension: Secondary | ICD-10-CM | POA: Insufficient documentation

## 2015-06-23 DIAGNOSIS — K219 Gastro-esophageal reflux disease without esophagitis: Secondary | ICD-10-CM | POA: Diagnosis not present

## 2015-06-23 DIAGNOSIS — Z87891 Personal history of nicotine dependence: Secondary | ICD-10-CM | POA: Insufficient documentation

## 2015-06-23 MED ORDER — DOXYCYCLINE HYCLATE 100 MG PO CAPS: 100.0000 mg | ORAL_CAPSULE | Freq: Two times a day (BID) | ORAL | Status: DC

## 2015-06-23 MED ORDER — ALBUTEROL SULFATE HFA 108 (90 BASE) MCG/ACT IN AERS
2.0000 | INHALATION_SPRAY | RESPIRATORY_TRACT | Status: DC | PRN
  Administered 2015-06-23: 2 via RESPIRATORY_TRACT
  Filled 2015-06-23: qty 6.7

## 2015-06-23 MED ORDER — DOXYCYCLINE HYCLATE 100 MG PO TABS
100.0000 mg | ORAL_TABLET | Freq: Once | ORAL | Status: AC
  Administered 2015-06-24: 100 mg via ORAL
  Filled 2015-06-23: qty 1

## 2015-06-23 NOTE — ED Provider Notes (Signed)
CSN: [id number]     Arrival date & time 06/23/15  1902 History  By signing my name below, I, Doran Stabler, attest that this documentation has been prepared under the direction and in the presence of Shanon Rosser, MD. Electronically Signed: Doran Stabler, ED Scribe. 06/23/2015. 11:19 PM.  Chief Complaint  Patient presents with  . Cough   HPI HPI Comments: Jose Kennedy is a 82 y.o. male with a PMHx of CAD, HTN, GERD, and DM who presents to the Emergency Department complaining of low-grade fever (Tmax 99.2 F) and productive cough for the past 3 days. Family gave Tylenol for the past two days with relief of the fever. Pt denies SOB, vomiting, diarrhea, dysuria or any other symptoms at this time. Symptoms are moderate.  Past Medical History  Diagnosis Date  . Stroke ([place])   . Coronary artery disease   . Hypertension   . GERD (gastroesophageal reflux disease)   . History of stomach ulcers   . Heart murmur   . Diabetes mellitus     insulin dependent   Past Surgical History  Procedure Laterality Date  . Laparoscopic cholecystectomy    . Back surgery    . Thyroid nodual    . Appendectomy    . Esophagogastroduodenoscopy  08/26/2011    Procedure: ESOPHAGOGASTRODUODENOSCOPY (EGD);  Surgeon: Wonda Horner, MD;  Location: [facility] ENDOSCOPY;  Service: Endoscopy;  Laterality: N/A;  . Esophagogastroduodenoscopy N/A 09/20/2013    Procedure: ESOPHAGOGASTRODUODENOSCOPY (EGD);  Surgeon: Jeryl Columbia, MD;  Location: [facility] ENDOSCOPY;  Service: Endoscopy;  Laterality: N/A;   History reviewed. No pertinent family history. Social History  Substance Use Topics  . Smoking status: Former Research scientist (life sciences)  . Smokeless tobacco: Never Used  . Alcohol Use: No    Review of Systems  A complete 10 system review of systems was obtained and all systems are negative except as noted in the HPI and PMH.   Allergies  Review of patient's allergies indicates no known allergies.  Home Medications   Prior to Admission medications    Medication Sig Start Date End Date Taking? Authorizing Provider  amLODipine (NORVASC) 10 MG tablet Take 10 mg by mouth daily.    Historical Provider, MD  carvedilol (COREG) 12.5 MG tablet Take 1 tablet (12.5 mg total) by mouth 2 (two) times daily with a meal. 07/30/13   Orson Eva, MD  docusate sodium (COLACE) 100 MG capsule Take 100 mg by mouth 2 (two) times daily.    Historical Provider, MD  doxycycline (VIBRAMYCIN) 100 MG capsule Take 1 capsule (100 mg total) by mouth 2 (two) times daily. One po bid x 7 days 06/23/15   Shanon Rosser, MD  ferrous sulfate 325 (65 FE) MG tablet Take 325 mg by mouth 2 (two) times daily.    Historical Provider, MD  hydrALAZINE (APRESOLINE) 10 MG tablet Take 10 mg by mouth 3 (three) times daily.    Historical Provider, MD  hydrochlorothiazide (HYDRODIURIL) 25 MG tablet Take 25 mg by mouth daily.    Historical Provider, MD  insulin NPH-insulin regular (NOVOLIN 70/30) (70-30) 100 UNIT/ML injection Inject 10 Units into the skin 2 (two) times daily with a meal.     Historical Provider, MD  latanoprost (XALATAN) 0.005 % ophthalmic solution Place 1 drop into both eyes at bedtime.    Historical Provider, MD  losartan (COZAAR) 100 MG tablet Take 100 mg by mouth daily.    Historical Provider, MD  Multiple Vitamins-Minerals (CENTRUM SILVER PO) Take 1 tablet by mouth  once a week.     Historical Provider, MD  Omeprazole-Sodium Bicarbonate (ZEGERID) 20-1100 MG CAPS capsule Take 1 capsule by mouth daily before breakfast.    Historical Provider, MD  simvastatin (ZOCOR) 80 MG tablet Take 40 mg by mouth at bedtime.     Historical Provider, MD  sucralfate (CARAFATE) 1 G tablet Take 1 g by mouth 4 (four) times daily.    Historical Provider, MD   BP 189/86 mmHg  Pulse 60  Temp(Src) 98.7 F (37.1 C) (Oral)  Resp 18  Ht 5\' 11"  (1.803 m)  Wt 185 lb (83.915 kg)  BMI 25.81 kg/m2  SpO2 95%   Physical Exam  General: Well-developed, well-nourished male in no acute distress; appearance  consistent with age of record HENT: normocephalic; atraumatic Eyes: pupils equal, round and reactive to light; extraocular muscles intact, arcus senilis and lens implants laterally Neck: supple Heart: regular rate and rhythm;  Lungs: coarse expiratory sounds Abdomen: soft; nondistended; nontender; no masses or hepatosplenomegaly; bowel sounds present Extremities: No deformity; full range of motion; pulses normal; 2+ pitting edema BLE Neurologic: Awake, alert and oriented; motor function intact in all extremities and symmetric; no facial droop Skin: Warm and dry Psychiatric: Normal mood and affect  ED Course  Procedures   DIAGNOSTIC STUDIES: Oxygen Saturation is 95% on room air, normal by my interpretation.    COORDINATION OF CARE: 11:12 PM Discussed treatment plan with pt at bedside and pt agreed to plan.    MDM  Nursing notes and vitals signs, including pulse oximetry, reviewed.  Summary of this visit's results, reviewed by myself:  Imaging Studies: Dg Chest 2 View  06/23/2015  CLINICAL DATA:  Productive cough and shortness of breath today. Mid chest pain. Former smoker. EXAM: CHEST  2 VIEW COMPARISON:  09/21/2013 FINDINGS: Mild cardiomegaly and ectasia the thoracic aorta are stable. Both lungs are clear. Previously seen area of airspace disease in the left lung base is no longer visualized. No evidence of pleural effusion. IMPRESSION: Stable mild cardiomegaly.  No active lung disease. Electronically Signed   By: Earle Gell M.D.   On: 06/23/2015 19:57     Final diagnoses:  Acute bronchitis with bronchospasm   I personally performed the services described in this documentation, which was scribed in my presence. The recorded information has been reviewed and is accurate.    Shanon Rosser, MD 06/23/15 [phone]

## 2015-06-23 NOTE — ED Notes (Signed)
Assumed care of patient from [place], [place]. No distress. VSS. Family at side. PT with hoarse voice, intermittently, pt and family states it is normal for pt. Awaiting disposition from EDP.

## 2015-06-23 NOTE — ED Notes (Signed)
Cough with fever x 3 days

## 2015-06-23 NOTE — Discharge Instructions (Signed)

## 2015-06-24 DIAGNOSIS — J209 Acute bronchitis, unspecified: Secondary | ICD-10-CM | POA: Diagnosis not present

## 2015-07-13 ENCOUNTER — Inpatient Hospital Stay (HOSPITAL_COMMUNITY)
Admission: EM | Admit: 2015-07-13 | Discharge: 2015-07-16 | DRG: 641 | Disposition: A | Discharge status: Home / Self Care | Payer: Medicare Other | Attending: Internal Medicine | Admitting: Internal Medicine

## 2015-07-13 ENCOUNTER — Encounter (HOSPITAL_COMMUNITY): Payer: Self-pay

## 2015-07-13 DIAGNOSIS — E871 Hypo-osmolality and hyponatremia: Secondary | ICD-10-CM | POA: Diagnosis present

## 2015-07-13 DIAGNOSIS — R05 Cough: Secondary | ICD-10-CM

## 2015-07-13 DIAGNOSIS — K219 Gastro-esophageal reflux disease without esophagitis: Secondary | ICD-10-CM | POA: Diagnosis present

## 2015-07-13 DIAGNOSIS — Z794 Long term (current) use of insulin: Secondary | ICD-10-CM | POA: Diagnosis not present

## 2015-07-13 DIAGNOSIS — E1121 Type 2 diabetes mellitus with diabetic nephropathy: Secondary | ICD-10-CM | POA: Diagnosis present

## 2015-07-13 DIAGNOSIS — R410 Disorientation, unspecified: Secondary | ICD-10-CM

## 2015-07-13 DIAGNOSIS — E860 Dehydration: Secondary | ICD-10-CM | POA: Diagnosis present

## 2015-07-13 DIAGNOSIS — Z8673 Personal history of transient ischemic attack (TIA), and cerebral infarction without residual deficits: Secondary | ICD-10-CM

## 2015-07-13 DIAGNOSIS — Z9049 Acquired absence of other specified parts of digestive tract: Secondary | ICD-10-CM | POA: Diagnosis not present

## 2015-07-13 DIAGNOSIS — E0821 Diabetes mellitus due to underlying condition with diabetic nephropathy: Secondary | ICD-10-CM | POA: Diagnosis not present

## 2015-07-13 DIAGNOSIS — E889 Metabolic disorder, unspecified: Secondary | ICD-10-CM | POA: Diagnosis present

## 2015-07-13 DIAGNOSIS — I2510 Atherosclerotic heart disease of native coronary artery without angina pectoris: Secondary | ICD-10-CM | POA: Diagnosis present

## 2015-07-13 DIAGNOSIS — I129 Hypertensive chronic kidney disease with stage 1 through stage 4 chronic kidney disease, or unspecified chronic kidney disease: Secondary | ICD-10-CM | POA: Diagnosis present

## 2015-07-13 DIAGNOSIS — F05 Delirium due to known physiological condition: Secondary | ICD-10-CM | POA: Diagnosis present

## 2015-07-13 DIAGNOSIS — D638 Anemia in other chronic diseases classified elsewhere: Secondary | ICD-10-CM | POA: Diagnosis present

## 2015-07-13 DIAGNOSIS — Z87891 Personal history of nicotine dependence: Secondary | ICD-10-CM | POA: Diagnosis not present

## 2015-07-13 DIAGNOSIS — K432 Incisional hernia without obstruction or gangrene: Secondary | ICD-10-CM | POA: Diagnosis present

## 2015-07-13 DIAGNOSIS — R059 Cough, unspecified: Secondary | ICD-10-CM

## 2015-07-13 DIAGNOSIS — N183 Chronic kidney disease, stage 3 unspecified: Secondary | ICD-10-CM | POA: Diagnosis present

## 2015-07-13 DIAGNOSIS — E1122 Type 2 diabetes mellitus with diabetic chronic kidney disease: Secondary | ICD-10-CM | POA: Diagnosis present

## 2015-07-13 DIAGNOSIS — R112 Nausea with vomiting, unspecified: Secondary | ICD-10-CM

## 2015-07-13 DIAGNOSIS — Z8711 Personal history of peptic ulcer disease: Secondary | ICD-10-CM

## 2015-07-13 HISTORY — DX: Other diseases of vocal cords: J38.3

## 2015-07-13 LAB — COMPREHENSIVE METABOLIC PANEL
ALBUMIN: 3.2 g/dL — AB (ref 3.5–5.0)
ALT: 23 U/L (ref 17–63)
AST: 35 U/L (ref 15–41)
Alkaline Phosphatase: 48 U/L (ref 38–126)
Anion gap: 11 (ref 5–15)
BUN: 17 mg/dL (ref 6–20)
CALCIUM: 8.5 mg/dL — AB (ref 8.9–10.3)
CO2: 26 mmol/L (ref 22–32)
CREATININE: 1.11 mg/dL (ref 0.61–1.24)
Chloride: 85 mmol/L — ABNORMAL LOW (ref 101–111)
GFR calc non Af Amer: 59 mL/min — ABNORMAL LOW (ref >60)
GLUCOSE: 179 mg/dL — AB (ref 65–99)
Potassium: 4.1 mmol/L (ref 3.5–5.1)
SODIUM: 122 mmol/L — AB (ref 135–145)
Total Bilirubin: 0.5 mg/dL (ref 0.3–1.2)
Total Protein: 6.2 g/dL — ABNORMAL LOW (ref 6.5–8.1)

## 2015-07-13 LAB — URINALYSIS, ROUTINE W REFLEX MICROSCOPIC
BILIRUBIN URINE: NEGATIVE
Glucose, UA: NEGATIVE mg/dL
HGB URINE DIPSTICK: NEGATIVE
Ketones, ur: NEGATIVE mg/dL
Leukocytes, UA: NEGATIVE
NITRITE: NEGATIVE
PROTEIN: NEGATIVE mg/dL
Specific Gravity, Urine: 1.014 (ref 1.005–1.030)
pH: 7.5 (ref 5.0–8.0)

## 2015-07-13 LAB — CBC
HCT: 29.2 % — ABNORMAL LOW (ref 39.0–52.0)
Hemoglobin: 9.5 g/dL — ABNORMAL LOW (ref 13.0–17.0)
MCH: 26.5 pg (ref 26.0–34.0)
MCHC: 32.5 g/dL (ref 30.0–36.0)
MCV: 81.6 fL (ref 78.0–100.0)
PLATELETS: 224 10*3/uL (ref 150–400)
RBC: 3.58 MIL/uL — ABNORMAL LOW (ref 4.22–5.81)
RDW: 14 % (ref 11.5–15.5)
WBC: 5.7 10*3/uL (ref 4.0–10.5)

## 2015-07-13 LAB — OSMOLALITY: Osmolality: 254 mOsm/kg — ABNORMAL LOW (ref 275–295)

## 2015-07-13 LAB — NA AND K (SODIUM & POTASSIUM), RAND UR
Potassium Urine: 52 mmol/L
Sodium, Ur: 91 mmol/L

## 2015-07-13 LAB — CBG MONITORING, ED: Glucose-Capillary: 175 mg/dL — ABNORMAL HIGH (ref 65–99)

## 2015-07-13 LAB — TSH: TSH: 0.511 u[IU]/mL (ref 0.350–4.500)

## 2015-07-13 LAB — OSMOLALITY, URINE: OSMOLALITY UR: 458 mosm/kg (ref 300–900)

## 2015-07-13 LAB — CORTISOL: Cortisol, Plasma: 11.7 ug/dL

## 2015-07-13 LAB — GLUCOSE, CAPILLARY: GLUCOSE-CAPILLARY: 143 mg/dL — AB (ref 65–99)

## 2015-07-13 LAB — URIC ACID: Uric Acid, Serum: 3.8 mg/dL — ABNORMAL LOW (ref 4.4–7.6)

## 2015-07-13 LAB — CREATININE, URINE, RANDOM: Creatinine, Urine: 77.93 mg/dL

## 2015-07-13 MED ORDER — DOCUSATE SODIUM 100 MG PO CAPS
100.0000 mg | ORAL_CAPSULE | Freq: Two times a day (BID) | ORAL | Status: DC
  Administered 2015-07-13 – 2015-07-16 (×6): 100 mg via ORAL
  Filled 2015-07-13 (×6): qty 1

## 2015-07-13 MED ORDER — ATORVASTATIN CALCIUM 40 MG PO TABS: 40.0000 mg | ORAL_TABLET | Freq: Every day | ORAL | Status: DC

## 2015-07-13 MED ORDER — ATORVASTATIN CALCIUM 20 MG PO TABS
20.0000 mg | ORAL_TABLET | Freq: Every day | ORAL | Status: DC
  Administered 2015-07-15: 20 mg via ORAL
  Filled 2015-07-13 (×2): qty 1

## 2015-07-13 MED ORDER — PANTOPRAZOLE SODIUM 40 MG PO TBEC
40.0000 mg | DELAYED_RELEASE_TABLET | Freq: Every day | ORAL | Status: DC
  Administered 2015-07-14 – 2015-07-16 (×3): 40 mg via ORAL
  Filled 2015-07-13 (×3): qty 1

## 2015-07-13 MED ORDER — AMLODIPINE BESYLATE 10 MG PO TABS: 10.0000 mg | ORAL_TABLET | Freq: Every day | ORAL | Status: DC

## 2015-07-13 MED ORDER — INSULIN ASPART 100 UNIT/ML ~~LOC~~ SOLN
0.0000 [IU] | Freq: Three times a day (TID) | SUBCUTANEOUS | Status: DC
  Administered 2015-07-14 (×2): 2 [IU] via SUBCUTANEOUS
  Administered 2015-07-16: 3 [IU] via SUBCUTANEOUS

## 2015-07-13 MED ORDER — INSULIN ASPART PROT & ASPART (70-30 MIX) 100 UNIT/ML ~~LOC~~ SUSP
10.0000 [IU] | Freq: Two times a day (BID) | SUBCUTANEOUS | Status: DC
  Administered 2015-07-14 – 2015-07-16 (×5): 10 [IU] via SUBCUTANEOUS
  Filled 2015-07-13: qty 10

## 2015-07-13 MED ORDER — HYDRALAZINE HCL 10 MG PO TABS
10.0000 mg | ORAL_TABLET | Freq: Three times a day (TID) | ORAL | Status: DC
  Administered 2015-07-13 – 2015-07-16 (×8): 10 mg via ORAL
  Filled 2015-07-13 (×9): qty 1

## 2015-07-13 MED ORDER — AMLODIPINE BESYLATE 10 MG PO TABS
10.0000 mg | ORAL_TABLET | Freq: Every day | ORAL | Status: DC
  Administered 2015-07-14 – 2015-07-16 (×3): 10 mg via ORAL
  Filled 2015-07-13 (×3): qty 1

## 2015-07-13 MED ORDER — CARVEDILOL 12.5 MG PO TABS
12.5000 mg | ORAL_TABLET | Freq: Two times a day (BID) | ORAL | Status: DC
  Administered 2015-07-14 – 2015-07-16 (×5): 12.5 mg via ORAL
  Filled 2015-07-13 (×5): qty 1

## 2015-07-13 MED ORDER — FERROUS SULFATE 325 (65 FE) MG PO TABS
325.0000 mg | ORAL_TABLET | Freq: Two times a day (BID) | ORAL | Status: DC
  Administered 2015-07-14 – 2015-07-16 (×5): 325 mg via ORAL
  Filled 2015-07-13 (×6): qty 1

## 2015-07-13 MED ORDER — SUCRALFATE 1 G PO TABS
1.0000 g | ORAL_TABLET | Freq: Four times a day (QID) | ORAL | Status: DC
  Administered 2015-07-14: 1 g via ORAL
  Filled 2015-07-13: qty 1

## 2015-07-13 MED ORDER — PANTOPRAZOLE SODIUM 40 MG PO TBEC: 40.0000 mg | DELAYED_RELEASE_TABLET | Freq: Every day | ORAL | Status: DC

## 2015-07-13 MED ORDER — SODIUM CHLORIDE 0.9 % IV SOLN
INTRAVENOUS | Status: AC
  Administered 2015-07-13: 75 mL/h via INTRAVENOUS

## 2015-07-13 MED ORDER — ONDANSETRON HCL 4 MG/2ML IJ SOLN: 4.0000 mg | Freq: Four times a day (QID) | INTRAMUSCULAR | Status: DC | PRN

## 2015-07-13 MED ORDER — ONDANSETRON HCL 4 MG PO TABS: 4.0000 mg | ORAL_TABLET | Freq: Four times a day (QID) | ORAL | Status: DC | PRN

## 2015-07-13 MED ORDER — LATANOPROST 0.005 % OP SOLN: 1.0000 [drp] | Freq: Every day | OPHTHALMIC | Status: DC

## 2015-07-13 MED ORDER — LOSARTAN POTASSIUM 50 MG PO TABS
100.0000 mg | ORAL_TABLET | Freq: Every day | ORAL | Status: DC
  Administered 2015-07-14 – 2015-07-16 (×3): 100 mg via ORAL
  Filled 2015-07-13 (×3): qty 2

## 2015-07-13 MED ORDER — ENOXAPARIN SODIUM 40 MG/0.4ML ~~LOC~~ SOLN
40.0000 mg | SUBCUTANEOUS | Status: DC
  Administered 2015-07-13 – 2015-07-15 (×3): 40 mg via SUBCUTANEOUS
  Filled 2015-07-13 (×3): qty 0.4

## 2015-07-13 NOTE — H&P (Addendum)
[facility] History and Physical  Jose Kennedy [id number] DOB: [birth year] DOA: 07/13/2015  Referring physician: ED physician, Dr. Wilson Singer PCP: Pcp Not In System   Chief Complaint: Confusion   HPI:  Pt is 82 yo male with HTN, DM, who presented to [facility] ED with main concern of confusion. Please note that pt is unable to provide medical history due to confusion, family at bedside explained that pt was constipated and was taking multiple stool softeners and was finally having BM's but it turned into multiple episodes of diarrhea. Family noted pt more confused and unable to get out of the bed. No reports of chest pain or shortness of breath, no abd or urinary concerns reported.   In ED, pt is hemodynamically stable, VSS, blood work notable for Na 122, otherwise unremarkable, TRH asked to admit for further evaluation.  Assessment and Plan: Principal Problem:   Acute confusional state of metabolic origin - secondary to hyponatremia, poor oral intake, dehydration in the setting of diarrhea  - admit to medical unit - evaluate the cause of hyponatremia - IVF - PTeval once pt more medically stable   Active Problems:   Hyponatremia - unclear etiology, suspect dehydration in the setting of diarrhea from stool softeners  - Una and Uosm, TSH pending - monitor urine output  - provide IVF, hold HCTZ  - BMP in AM    CKD (chronic kidney disease) stage 3, GFR 30-59 ml/min - Cr stable and WNL - monitor     Diabetes mellitus with diabetic nephropathy (HCC) - SSI and insulin per home medical regimen     Anemia of chronic disease - no signs of bleeding - CBC In AM  Lovenox SQ for DVT prophylaxis   Radiological Exams on Admission: No results found.   Code Status: Full Family Communication: No family at bedside  Disposition Plan: Admit for further evaluation    [facility] [id number]  Review of Systems:  Unable to confusion   Past Medical History  Diagnosis Date  . Stroke  ([place])   . Coronary artery disease   . Hypertension   . GERD (gastroesophageal reflux disease)   . History of stomach ulcers   . Heart murmur   . Diabetes mellitus     insulin dependent  . Laryngeal dystonia     Past Surgical History  Procedure Laterality Date  . Laparoscopic cholecystectomy    . Back surgery    . Thyroid nodual    . Appendectomy    . Esophagogastroduodenoscopy  08/26/2011    Procedure: ESOPHAGOGASTRODUODENOSCOPY (EGD);  Surgeon: Wonda Horner, MD;  Location: [facility] ENDOSCOPY;  Service: Endoscopy;  Laterality: N/A;  . Esophagogastroduodenoscopy N/A 09/20/2013    Procedure: ESOPHAGOGASTRODUODENOSCOPY (EGD);  Surgeon: Jeryl Columbia, MD;  Location: [facility] ENDOSCOPY;  Service: Endoscopy;  Laterality: N/A;    Social History:  reports that he has quit smoking. He has never used smokeless tobacco. He reports that he does not drink alcohol or use illicit drugs.  No Known Allergies  Unable to obtain family history due to pt's confusion    Prior to Admission medications   Medication Sig Start Date End Date Taking? Authorizing Provider  amLODipine (NORVASC) 10 MG tablet Take 10 mg by mouth daily.    Historical Provider, MD  carvedilol (COREG) 12.5 MG tablet Take 1 tablet (12.5 mg total) by mouth 2 (two) times daily with a meal. 07/30/13   Orson Eva, MD  docusate sodium (COLACE) 100 MG capsule Take 100 mg by mouth  2 (two) times daily.    Historical Provider, MD  doxycycline (VIBRAMYCIN) 100 MG capsule Take 1 capsule (100 mg total) by mouth 2 (two) times daily. One po bid x 7 days 06/23/15   Shanon Rosser, MD  ferrous sulfate 325 (65 FE) MG tablet Take 325 mg by mouth 2 (two) times daily.    Historical Provider, MD  hydrALAZINE (APRESOLINE) 10 MG tablet Take 10 mg by mouth 3 (three) times daily.    Historical Provider, MD  hydrochlorothiazide (HYDRODIURIL) 25 MG tablet Take 25 mg by mouth daily.    Historical Provider, MD  insulin NPH-insulin regular (NOVOLIN 70/30) (70-30) 100 UNIT/ML  injection Inject 10 Units into the skin 2 (two) times daily with a meal.     Historical Provider, MD  latanoprost (XALATAN) 0.005 % ophthalmic solution Place 1 drop into both eyes at bedtime.    Historical Provider, MD  losartan (COZAAR) 100 MG tablet Take 100 mg by mouth daily.    Historical Provider, MD  Multiple Vitamins-Minerals (CENTRUM SILVER PO) Take 1 tablet by mouth once a week.     Historical Provider, MD  Omeprazole-Sodium Bicarbonate (ZEGERID) 20-1100 MG CAPS capsule Take 1 capsule by mouth daily before breakfast.    Historical Provider, MD  simvastatin (ZOCOR) 80 MG tablet Take 40 mg by mouth at bedtime.     Historical Provider, MD  sucralfate (CARAFATE) 1 G tablet Take 1 g by mouth 4 (four) times daily.    Historical Provider, MD    Physical Exam: Filed Vitals:   07/13/15 1416  BP: 141/60  Pulse: 67  Temp: 98.5 F (36.9 C)  TempSrc: Oral  Resp: 16  SpO2: 97%    Physical Exam  Constitutional: Appears calm, confused, NAD HENT: Normocephalic. External right and left ear normal. Dry MM Eyes: Conjunctivae and EOM are normal. PERRLA, no scleral icterus.  Neck: Normal ROM. Neck supple. No JVD. No tracheal deviation. No thyromegaly.  CVS: RRR, no gallops, no carotid bruit.  Pulmonary: Effort and breath sounds normal, no stridor, diminished breath sounds at bases  Abdominal: Soft. BS +,  no distension, tenderness, rebound or guarding.  Musculoskeletal: Normal range of motion.  Lymphadenopathy: No lymphadenopathy noted, cervical, inguinal. Neuro: Alert. Confused, moving all 4 extremities  Skin: Skin is warm and dry. No rash noted. Not diaphoretic. No erythema. No pallor.  Psychiatric: Difficult to assess due to confusion   Labs on Admission:  Basic Metabolic Panel:  Recent Labs Lab 07/13/15 1417  NA 122*  K 4.1  CL 85*  CO2 26  GLUCOSE 179*  BUN 17  CREATININE 1.11  CALCIUM 8.5*   Liver Function Tests:  Recent Labs Lab 07/13/15 1417  AST 35  ALT 23   ALKPHOS 48  BILITOT 0.5  PROT 6.2*  ALBUMIN 3.2*   CBC:  Recent Labs Lab 07/13/15 1417  WBC 5.7  HGB 9.5*  HCT 29.2*  MCV 81.6  PLT 224   CBG:  Recent Labs Lab 07/13/15 1416  GLUCAP 175*    EKG: pending    If 7PM-7AM, please contact night-coverage [email or web address] Password [facility] 07/13/2015, 5:40 PM

## 2015-07-13 NOTE — ED Notes (Signed)
Pts daughter bringing pt to ED for confusion.  Pt A&O to person, place, situation, not to time (reports it is 1987).  Pt reports he "feels bad", stomach hurts.  Pt has laryngeal dystonia and he makes a noise and holds mouth in a certain way when he is waiting for throat to relax.  Per daughter this reflex is exaggerated today and occuring more frequently.  At 10am daughter asked pt if he took his BS, pt reported he took "12" earlier.   At 11:50a pt was able to take BS and it was 307.  When daughter asked pt to take his insulin, the insulin was on the table in front of him and didn't recognizes the vial, syringes and could not tell daughter how to give hisself insulin until one hour later.

## 2015-07-13 NOTE — ED Provider Notes (Signed)
CSN: [id number]     Arrival date & time 07/13/15  1352 History   First MD Initiated Contact with Patient 07/13/15 1619     Chief Complaint  Patient presents with  . Altered Mental Status     (Consider location/radiation/quality/duration/timing/severity/associated sxs/prior Treatment) HPI   A 82-year-old male with confusion. Presenting with his daughter. Patient lives at home with her. Shortly before arrival patient seemed very confused. He is an insulin-dependent diabetic and has been given himself insulin for years. Prior to arrival he was sitting down at a table and just couldn't figure out what he needed to do to check his blood sugar or administer himself insulin. He seemed "out of it." Blood glucose checked and 300. Patient did not voice any specific complaints to his daughter. Last seen normal earlier this morning. No recent medication changes. No fever or chills. No recent falls. Has a past history of laryngeal dystonia. He has been more symptomatic in the last 2 days with regards to this but otherwise no other acute complaints.  Past Medical History  Diagnosis Date  . Stroke ([employer])   . Coronary artery disease   . Hypertension   . GERD (gastroesophageal reflux disease)   . History of stomach ulcers   . Heart murmur   . Diabetes mellitus     insulin dependent  . Laryngeal dystonia    Past Surgical History  Procedure Laterality Date  . Laparoscopic cholecystectomy    . Back surgery    . Thyroid nodual    . Appendectomy    . Esophagogastroduodenoscopy  08/26/2011    Procedure: ESOPHAGOGASTRODUODENOSCOPY (EGD);  Surgeon: Wonda Horner, MD;  Location: [facility] ENDOSCOPY;  Service: Endoscopy;  Laterality: N/A;  . Esophagogastroduodenoscopy N/A 09/20/2013    Procedure: ESOPHAGOGASTRODUODENOSCOPY (EGD);  Surgeon: Jeryl Columbia, MD;  Location: [facility] ENDOSCOPY;  Service: Endoscopy;  Laterality: N/A;   History reviewed. No pertinent family history. Social History  Substance Use Topics  . Smoking  status: Former Research scientist (life sciences)  . Smokeless tobacco: Never Used  . Alcohol Use: No    Review of Systems  All systems reviewed and negative, other than as noted in HPI.   Allergies  Review of patient's allergies indicates no known allergies.  Home Medications   Prior to Admission medications   Medication Sig Start Date End Date Taking? Authorizing Provider  amLODipine (NORVASC) 10 MG tablet Take 10 mg by mouth daily.    Historical Provider, MD  carvedilol (COREG) 12.5 MG tablet Take 1 tablet (12.5 mg total) by mouth 2 (two) times daily with a meal. 07/30/13   Orson Eva, MD  docusate sodium (COLACE) 100 MG capsule Take 100 mg by mouth 2 (two) times daily.    Historical Provider, MD  doxycycline (VIBRAMYCIN) 100 MG capsule Take 1 capsule (100 mg total) by mouth 2 (two) times daily. One po bid x 7 days 06/23/15   Shanon Rosser, MD  ferrous sulfate 325 (65 FE) MG tablet Take 325 mg by mouth 2 (two) times daily.    Historical Provider, MD  hydrALAZINE (APRESOLINE) 10 MG tablet Take 10 mg by mouth 3 (three) times daily.    Historical Provider, MD  hydrochlorothiazide (HYDRODIURIL) 25 MG tablet Take 25 mg by mouth daily.    Historical Provider, MD  insulin NPH-insulin regular (NOVOLIN 70/30) (70-30) 100 UNIT/ML injection Inject 10 Units into the skin 2 (two) times daily with a meal.     Historical Provider, MD  latanoprost (XALATAN) 0.005 % ophthalmic solution Place 1  drop into both eyes at bedtime.    Historical Provider, MD  losartan (COZAAR) 100 MG tablet Take 100 mg by mouth daily.    Historical Provider, MD  Multiple Vitamins-Minerals (CENTRUM SILVER PO) Take 1 tablet by mouth once a week.     Historical Provider, MD  Omeprazole-Sodium Bicarbonate (ZEGERID) 20-1100 MG CAPS capsule Take 1 capsule by mouth daily before breakfast.    Historical Provider, MD  simvastatin (ZOCOR) 80 MG tablet Take 40 mg by mouth at bedtime.     Historical Provider, MD  sucralfate (CARAFATE) 1 G tablet Take 1 g by mouth 4  (four) times daily.    Historical Provider, MD   BP 141/60 mmHg  Pulse 67  Temp(Src) 98.5 F (36.9 C) (Oral)  Resp 16  SpO2 97% Physical Exam  Constitutional: He appears well-developed and well-nourished. No distress.  Sitting in bed. NAD. Frequent episodes where he contorts his face, has difficulty speaking and sometimes grabs at his chest. Pt/family reports these spasms are normal for him.   HENT:  Head: Normocephalic and atraumatic.  Eyes: Conjunctivae are normal. Right eye exhibits no discharge. Left eye exhibits no discharge.  Neck: Neck supple.  Cardiovascular: Normal rate, regular rhythm and normal heart sounds.  Exam reveals no gallop and no friction rub.   No murmur heard. Pulmonary/Chest: Effort normal and breath sounds normal. No respiratory distress.  Abdominal: Soft. He exhibits no distension. There is no tenderness.  Musculoskeletal: He exhibits edema. He exhibits no tenderness.  LE edema, R>L. Pt reports that baseline.   Neurological: He is alert.  Skin: Skin is warm and dry.  Psychiatric: He has a normal mood and affect. His behavior is normal. Thought content normal.  Nursing note and vitals reviewed.   ED Course  Procedures (including critical care time) Labs Review Labs Reviewed  COMPREHENSIVE METABOLIC PANEL - Abnormal; Notable for the following:    Sodium 122 (*)    Chloride 85 (*)    Glucose, Bld 179 (*)    Calcium 8.5 (*)    Total Protein 6.2 (*)    Albumin 3.2 (*)    GFR calc non Af Amer 59 (*)    All other components within normal limits  CBC - Abnormal; Notable for the following:    RBC 3.58 (*)    Hemoglobin 9.5 (*)    HCT 29.2 (*)    All other components within normal limits  CBG MONITORING, ED - Abnormal; Notable for the following:    Glucose-Capillary 175 (*)    All other components within normal limits  CREATININE, URINE, RANDOM  NA AND K (SODIUM & POTASSIUM), RAND UR  URIC ACID  URIC ACID, RANDOM URINE  TSH  CORTISOL  URINALYSIS,  ROUTINE W REFLEX MICROSCOPIC (NOT AT [facility])  OSMOLALITY  OSMOLALITY, URINE    Imaging Review No results found. I have personally reviewed and evaluated these images and lab results as part of my medical decision-making.   EKG Interpretation None      MDM   Final diagnoses:  Acute confusion  Hyponatremia    87yM with confusion. Suspect 2/2 hyponatremia. Sodium 122. On HCTZ, but has been for years. Suspect acute change is from increased water intake. Took some milk of mag a couple days ago for constipation. Had several BMs and trying to compensate by drinking more water. Has a large pitcher of water that daughter estimates is 1-2L. Has been drinking up to two of these per day for last couple days. Does  seem significantly altered to me currently. No seizures reported. No need for hypertonic saline. Fluid restrict for now. Additional studies ordered. Admission .     Virgel Manifold, MD 07/13/15 [phone]

## 2015-07-14 DIAGNOSIS — D638 Anemia in other chronic diseases classified elsewhere: Secondary | ICD-10-CM

## 2015-07-14 DIAGNOSIS — F05 Delirium due to known physiological condition: Secondary | ICD-10-CM

## 2015-07-14 DIAGNOSIS — R410 Disorientation, unspecified: Secondary | ICD-10-CM | POA: Insufficient documentation

## 2015-07-14 DIAGNOSIS — E889 Metabolic disorder, unspecified: Secondary | ICD-10-CM

## 2015-07-14 LAB — BASIC METABOLIC PANEL
Anion gap: 12 (ref 5–15)
BUN: 15 mg/dL (ref 6–20)
CHLORIDE: 85 mmol/L — AB (ref 101–111)
CO2: 27 mmol/L (ref 22–32)
CREATININE: 0.95 mg/dL (ref 0.61–1.24)
Calcium: 8.4 mg/dL — ABNORMAL LOW (ref 8.9–10.3)
Glucose, Bld: 108 mg/dL — ABNORMAL HIGH (ref 65–99)
Potassium: 3.5 mmol/L (ref 3.5–5.1)
SODIUM: 124 mmol/L — AB (ref 135–145)

## 2015-07-14 LAB — MAGNESIUM: Magnesium: 1.9 mg/dL (ref 1.7–2.4)

## 2015-07-14 LAB — CBC
HCT: 28.1 % — ABNORMAL LOW (ref 39.0–52.0)
Hemoglobin: 9.2 g/dL — ABNORMAL LOW (ref 13.0–17.0)
MCH: 26.5 pg (ref 26.0–34.0)
MCHC: 32.7 g/dL (ref 30.0–36.0)
MCV: 81 fL (ref 78.0–100.0)
PLATELETS: 232 10*3/uL (ref 150–400)
RBC: 3.47 MIL/uL — AB (ref 4.22–5.81)
RDW: 13.7 % (ref 11.5–15.5)
WBC: 6 10*3/uL (ref 4.0–10.5)

## 2015-07-14 LAB — GLUCOSE, CAPILLARY
GLUCOSE-CAPILLARY: 118 mg/dL — AB (ref 65–99)
Glucose-Capillary: 181 mg/dL — ABNORMAL HIGH (ref 65–99)
Glucose-Capillary: 173 mg/dL — ABNORMAL HIGH (ref 65–99)

## 2015-07-14 LAB — URIC ACID, RANDOM URINE: URIC ACID, URINE: 37.4 mg/dL

## 2015-07-14 MED ORDER — SUCRALFATE 1 G PO TABS
1.0000 g | ORAL_TABLET | Freq: Three times a day (TID) | ORAL | Status: DC
  Administered 2015-07-14 – 2015-07-16 (×8): 1 g via ORAL
  Filled 2015-07-14 (×8): qty 1

## 2015-07-14 MED ORDER — SODIUM CHLORIDE 0.9 % IV SOLN: INTRAVENOUS | Status: AC

## 2015-07-14 NOTE — Progress Notes (Addendum)
[facility] PROGRESS NOTE  Jose Kennedy [id number] DOB: July 15, 1927 DOA: 07/13/2015 PCP: Pcp Not In System  Length of stay: 1   Assessment/Plan: Principal Problem:   Acute confusional state of metabolic origin Active Problems:   CKD (chronic kidney disease) stage 3, GFR 30-59 ml/min   Hyponatremia   Diabetes mellitus with diabetic nephropathy (HCC)   Anemia of chronic disease   Acute confusion   82 yo male with HTN, DM, who presented to [facility] ED with main concern of confusion. Please note that pt is unable to provide medical history due to confusion, family at bedside explained that pt was constipated and was taking multiple stool softeners and was finally having BM's but it turned into multiple episodes of diarrhea. Family noted pt more confused and unable to get out of the bed. No reports of chest pain or shortness of breath, no abd or urinary concerns reported.   In ED, pt is hemodynamically stable, VSS, blood work notable for Na 122, otherwise unremarkable, [facility] asked to admit for further evaluation.  Assessment and Plan: Principal Problem:  Acute confusional state of metabolic origin - secondary to hyponatremia, poor oral intake, dehydration in the setting of diarrhea  - admit to medical unit - evaluate the cause of hyponatremia - IVF - PTeval once pt more medically stable  Previously on dysphagia 3 diet for aspiration, Will repeat chest x-ray to rule out underlying pneumonia     Hyponatremia - Suspect secondary to HCTZ, suspect dehydration in the setting of diarrhea from stool softeners  - Una and Uosm, TSH Within normal limits - monitor urine output  Improving slowly, continue to follow BMP, continue IV fluids Cortisone 11.Doubt adrenal insufficiency   Hypertension-Continue Norvasc, Coreg, low-dose hydralazine,Cozaar,  hold HCTZ  , hold Cozaar       CKD (chronic kidney disease) stage 3, GFR 30-59 ml/min - Cr stable and WNL - monitor    Diabetes  mellitus with diabetic nephropathy (HCC) - SSI and insulin per home medical regimen  Patient is on insulin 70/30 at home   Anemia of chronic disease - no signs of bleeding - CBC In AM Multiple gastric polyps seen in EGD 5/27 by Dr. Watt Climes. Biopsies obtained and were negative for malignancy and H pylori  Abdominal pain. Constipation Will order CT abdomen and pelvis, to r/o malignancy   DVT prophylaxsis  Lovenox  Code Status:      Code Status Orders        Start     Ordered   07/13/15 1905  Full code   Continuous     07/13/15 1904    Family Communication: Discussed in detail with the patient, all imaging results, lab results explained to the patient   Disposition Plan:  PT/OT evaluation     Consultants:  None  Procedures:  None  Antibiotics: Anti-infectives    None         HPI/Subjective: Patient appears to be confused,   Objective: Filed Vitals:   07/13/15 2136 07/14/15 0122 07/14/15 0440 07/14/15 0923  BP: 146/74   150/70  Pulse: 73   68  Temp: 98.2 F (36.8 C)     TempSrc: Oral     Resp: 20     Height:  5\' 11"  (1.803 m)    Weight:   88.6 kg (195 lb 5.2 oz)   SpO2: 100%       Intake/Output Summary (Last 24 hours) at 07/14/15 1232 Last data filed at 07/14/15  1108  Gross per 24 hour  Intake      0 ml  Output   1050 ml  Net  -1050 ml    Exam:  General: No acute respiratory distress Lungs: Clear to auscultation bilaterally without wheezes or crackles Cardiovascular: Regular rate and rhythm without murmur gallop or rub normal S1 and S2 Abdomen: Nontender, nondistended, soft, bowel sounds positive, no rebound, no ascites, no appreciable mass Extremities: No significant cyanosis, clubbing, or edema bilateral lower extremities     Data Review   Micro Results No results found for this or any previous visit (from the past 240 hour(s)).  Radiology Reports Dg Chest 2 View  06/23/2015  CLINICAL DATA:  Productive cough and shortness of  breath today. Mid chest pain. Former smoker. EXAM: CHEST  2 VIEW COMPARISON:  09/21/2013 FINDINGS: Mild cardiomegaly and ectasia the thoracic aorta are stable. Both lungs are clear. Previously seen area of airspace disease in the left lung base is no longer visualized. No evidence of pleural effusion. IMPRESSION: Stable mild cardiomegaly.  No active lung disease. Electronically Signed   By: Earle Gell M.D.   On: 06/23/2015 19:57     CBC  Recent Labs Lab 07/13/15 1417 07/14/15 0548  WBC 5.7 6.0  HGB 9.5* 9.2*  HCT 29.2* 28.1*  PLT 224 232  MCV 81.6 81.0  MCH 26.5 26.5  MCHC 32.5 32.7  RDW 14.0 13.7    Chemistries   Recent Labs Lab 07/13/15 1417 07/14/15 0548  NA 122* 124*  K 4.1 3.5  CL 85* 85*  CO2 26 27  GLUCOSE 179* 108*  BUN 17 15  CREATININE 1.11 0.95  CALCIUM 8.5* 8.4*  AST 35  --   ALT 23  --   ALKPHOS 48  --   BILITOT 0.5  --    ------------------------------------------------------------------------------------------------------------------ estimated creatinine clearance is 60.5 mL/min (by C-G formula based on Cr of 0.95). ------------------------------------------------------------------------------------------------------------------ No results for input(s): HGBA1C in the last 72 hours. ------------------------------------------------------------------------------------------------------------------ No results for input(s): CHOL, HDL, LDLCALC, TRIG, CHOLHDL, LDLDIRECT in the last 72 hours. ------------------------------------------------------------------------------------------------------------------  Recent Labs  07/13/15 1817  TSH 0.511   ------------------------------------------------------------------------------------------------------------------ No results for input(s): VITAMINB12, FOLATE, FERRITIN, TIBC, IRON, RETICCTPCT in the last 72 hours.  Coagulation profile No results for input(s): INR, PROTIME in the last 168 hours.  No results for  input(s): DDIMER in the last 72 hours.  Cardiac Enzymes No results for input(s): CKMB, TROPONINI, MYOGLOBIN in the last 168 hours.  Invalid input(s): CK ------------------------------------------------------------------------------------------------------------------ Invalid input(s): POCBNP   CBG:  Recent Labs Lab 07/13/15 1416 07/13/15 2134 07/14/15 0820 07/14/15 1155  GLUCAP 175* 143* 118* 181*       Studies: No results found.    Lab Results  Component Value Date   HGBA1C 6.4* 07/30/2013   HGBA1C 5.7* 12/13/2012   HGBA1C * 10/28/2009    6.9 (NOTE)                                                                       According to the ADA Clinical Practice Recommendations for 2011, when HbA1c is used as a screening test:   >=6.5%   Diagnostic of Diabetes Mellitus           (if  abnormal result  is confirmed)  5.7-6.4%   Increased risk of developing Diabetes Mellitus  References:Diagnosis and Classification of Diabetes Mellitus,Diabetes [id number] 1):S62-S69 and Standards of Medical Care in         Diabetes - 2011,Diabetes [id number]  (Suppl 1):S11-S61.   Lab Results  Component Value Date   LDLCALC 60 12/13/2012   CREATININE 0.95 07/14/2015       Scheduled Meds: . amLODipine  10 mg Oral Daily  . atorvastatin  20 mg Oral q1800  . carvedilol  12.5 mg Oral BID WC  . docusate sodium  100 mg Oral BID  . enoxaparin (LOVENOX) injection  40 mg Subcutaneous Q24H  . ferrous sulfate  325 mg Oral BID  . hydrALAZINE  10 mg Oral TID  . insulin aspart  0-9 Units Subcutaneous TID WC  . insulin aspart protamine- aspart  10 Units Subcutaneous BID WC  . losartan  100 mg Oral Daily  . pantoprazole  40 mg Oral Daily  . sucralfate  1 g Oral QID   Continuous Infusions:   Principal Problem:   Acute confusional state of metabolic origin Active Problems:   CKD (chronic kidney disease) stage 3, GFR 30-59 ml/min   Hyponatremia   Diabetes mellitus with diabetic  nephropathy (HCC)   Anemia of chronic disease   Acute confusion    Time spent: 45 minutes   [facility] Pager [phone]. If 7PM-7AM, please contact night-coverage at [email or web address], password [facility] 07/14/2015, 12:32 PM  LOS: 1 day

## 2015-07-15 ENCOUNTER — Inpatient Hospital Stay (HOSPITAL_COMMUNITY): Payer: Medicare Other

## 2015-07-15 ENCOUNTER — Encounter (HOSPITAL_COMMUNITY): Payer: Self-pay | Admitting: Radiology

## 2015-07-15 DIAGNOSIS — N183 Chronic kidney disease, stage 3 (moderate): Secondary | ICD-10-CM

## 2015-07-15 LAB — COMPREHENSIVE METABOLIC PANEL
ALBUMIN: 2.9 g/dL — AB (ref 3.5–5.0)
ALT: 23 U/L (ref 17–63)
AST: 50 U/L — AB (ref 15–41)
Alkaline Phosphatase: 43 U/L (ref 38–126)
Anion gap: 8 (ref 5–15)
BUN: 10 mg/dL (ref 6–20)
CHLORIDE: 88 mmol/L — AB (ref 101–111)
CO2: 29 mmol/L (ref 22–32)
CREATININE: 0.98 mg/dL (ref 0.61–1.24)
Calcium: 8.2 mg/dL — ABNORMAL LOW (ref 8.9–10.3)
GFR calc Af Amer: >60 mL/min (ref >60)
GFR calc non Af Amer: >60 mL/min (ref >60)
GLUCOSE: 108 mg/dL — AB (ref 65–99)
POTASSIUM: 3.5 mmol/L (ref 3.5–5.1)
SODIUM: 125 mmol/L — AB (ref 135–145)
Total Bilirubin: 0.5 mg/dL (ref 0.3–1.2)
Total Protein: 5.5 g/dL — ABNORMAL LOW (ref 6.5–8.1)

## 2015-07-15 LAB — GLUCOSE, CAPILLARY
GLUCOSE-CAPILLARY: 116 mg/dL — AB (ref 65–99)
GLUCOSE-CAPILLARY: 47 mg/dL — AB (ref 65–99)
GLUCOSE-CAPILLARY: 95 mg/dL (ref 65–99)
Glucose-Capillary: 88 mg/dL (ref 65–99)
Glucose-Capillary: 106 mg/dL — ABNORMAL HIGH (ref 65–99)
Glucose-Capillary: 84 mg/dL (ref 65–99)

## 2015-07-15 LAB — CBC
HEMATOCRIT: 30 % — AB (ref 39.0–52.0)
Hemoglobin: 10.1 g/dL — ABNORMAL LOW (ref 13.0–17.0)
MCH: 27.2 pg (ref 26.0–34.0)
MCHC: 33.7 g/dL (ref 30.0–36.0)
MCV: 80.9 fL (ref 78.0–100.0)
PLATELETS: 256 10*3/uL (ref 150–400)
RBC: 3.71 MIL/uL — ABNORMAL LOW (ref 4.22–5.81)
RDW: 13.8 % (ref 11.5–15.5)
WBC: 6.2 10*3/uL (ref 4.0–10.5)

## 2015-07-15 LAB — URINE CULTURE

## 2015-07-15 MED ORDER — SODIUM CHLORIDE 0.9 % IV SOLN
INTRAVENOUS | Status: AC
  Administered 2015-07-15: 09:00:00 via INTRAVENOUS

## 2015-07-15 MED ORDER — IOHEXOL 300 MG/ML  SOLN
100.0000 mL | Freq: Once | INTRAMUSCULAR | Status: AC | PRN
  Administered 2015-07-15: 100 mL via INTRAVENOUS

## 2015-07-15 MED ORDER — IOHEXOL 300 MG/ML  SOLN
50.0000 mL | INTRAMUSCULAR | Status: AC
  Administered 2015-07-15 (×2): 50 mL via ORAL

## 2015-07-15 NOTE — Progress Notes (Signed)
Updated pt daughter regarding pt status. Daughter wanted it to be clear that the pt is not having any abdominal pain or constipation. She wants the MD to be aware. Also, daughter thinks it would be beneficial if the pt has a speech evaluation and work with PT. Will continue to monitor

## 2015-07-15 NOTE — Progress Notes (Addendum)
[facility] PROGRESS NOTE  Gadge Grgurich [id number] DOB: January 08, 1928 DOA: 07/13/2015 PCP: Pcp Not In System  Length of stay: 2   Assessment/Plan: Principal Problem:   Acute confusional state of metabolic origin Active Problems:   CKD (chronic kidney disease) stage 3, GFR 30-59 ml/min   Hyponatremia   Diabetes mellitus with diabetic nephropathy (HCC)   Anemia of chronic disease   Acute confusion   82 yo male with HTN, DM, who presented to [facility] ED with main concern of confusion. Please note that pt is unable to provide medical history due to confusion, family at bedside explained that pt was constipated and was taking multiple stool softeners and was finally having BM's but it turned into multiple episodes of diarrhea. Family noted pt more confused and unable to get out of the bed. No reports of chest pain or shortness of breath, no abd or urinary concerns reported.   In ED, pt is hemodynamically stable, VSS, blood work notable for Na 122, otherwise unremarkable, [facility] asked to admit for further evaluation.  Assessment and Plan: Principal Problem:  Acute confusional state of metabolic origin - secondary to hyponatremia, poor oral intake, dehydration in the setting of diarrhea  - evaluate the cause of hyponatremia Continue IV fluids, sodium improving - PTeval once pt more medically stable  Previously on dysphagia 3 diet for aspiration,  Repeat chest x-ray no pneumonia    Incisional hernia-it may intermittently be causing a PSBO, however, the patient has been asymptomatic.Clinically the patient is not obstructed. Hernia is reducible. No need for emergent surgery. Patient seen by general surgery this morning. Patient has been restarted on a diet by general surgery      Hyponatremia, improving slowly - Suspect secondary to HCTZ, suspect dehydration in the setting of diarrhea from stool softeners  - Una and Uosm, TSH Within normal limits - monitor urine output   Improving slowly, continue to follow BMP, continue IV fluids Cortisone 11.Doubt adrenal insufficiency   Hypertension-Continue Norvasc, Coreg, low-dose hydralazine,Cozaar,  hold HCTZ  , hold Cozaar       CKD (chronic kidney disease) stage 3, GFR 30-59 ml/min - Cr stable and WNL - monitor    Diabetes mellitus with diabetic nephropathy (HCC) - SSI and insulin per home medical regimen  Patient is on insulin 70/30 at home   Anemia of chronic disease - no signs of bleeding - CBC In AM Multiple gastric polyps seen in EGD 5/27 by Dr. Watt Climes. Biopsies obtained and were negative for malignancy and H pylori  Abdominal pain. Constipation No evidence of underlying malignancy, probable partial small bowel obstruction   DVT prophylaxsis  Lovenox  Code Status:      Code Status Orders        Start     Ordered   07/13/15 1905  Full code   Continuous     07/13/15 1904    Family Communication: Discussed in detail with the patient/Son yesterday by the bedside, all imaging results, lab results explained to the patient   Disposition Plan:  PT/OT evaluation     Consultants:  None  Procedures:  None  Antibiotics: Anti-infectives    None         HPI/Subjective: Patient appears to be confused, the family by the bedside today,  Objective: Filed Vitals:   07/14/15 2117 07/15/15 0452 07/15/15 0855 07/15/15 1427  BP: 106/58 146/67 147/77 155/82  Pulse: 64 65 63 71  Temp: 98.7 F (37.1 C) 99 F (  37.2 C)  97.6 F (36.4 C)  TempSrc: Oral Oral    Resp: 18 20  18   Height:      Weight:  91 kg (200 lb 9.9 oz)    SpO2: 95% 99%  100%    Intake/Output Summary (Last 24 hours) at 07/15/15 1447 Last data filed at 07/15/15 [id number]  Gross per 24 hour  Intake    960 ml  Output   1550 ml  Net   -590 ml    Exam:  General: No acute respiratory distress Lungs: Clear to auscultation bilaterally without wheezes or crackles Cardiovascular: Regular rate and rhythm without  murmur gallop or rub normal S1 and S2 Abdomen: Nontender, nondistended, soft, bowel sounds positive, no rebound, no ascites, no appreciable mass Extremities: No significant cyanosis, clubbing, or edema bilateral lower extremities     Data Review   Micro Results Recent Results (from the past 240 hour(s))  Urine culture     Status: None   Collection Time: 07/13/15  7:05 PM  Result Value Ref Range Status   Specimen Description URINE, RANDOM  Final   Special Requests NONE  Final   Culture MULTIPLE SPECIES PRESENT, SUGGEST RECOLLECTION  Final   Report Status 07/15/2015 FINAL  Final    Radiology Reports Dg Chest 2 View  07/15/2015  CLINICAL DATA:  82 year old male with cough and congestion for 4 days. Subsequent encounter. EXAM: CHEST  2 VIEW COMPARISON:  06/23/2015. FINDINGS: Dilated calcified tortuous aorta. Heart size top-normal to minimally enlarged. Central pulmonary vascular prominence and minimal peribronchial thickening without frank pulmonary edema. No segmental consolidation or pneumothorax. IMPRESSION: Central pulmonary vascular prominence and minimal peribronchial thickening without frank pulmonary edema. Dilated calcified tortuous aorta. Electronically Signed   By: Genia Del M.D.   On: 07/15/2015 07:51   Dg Chest 2 View  06/23/2015  CLINICAL DATA:  Productive cough and shortness of breath today. Mid chest pain. Former smoker. EXAM: CHEST  2 VIEW COMPARISON:  09/21/2013 FINDINGS: Mild cardiomegaly and ectasia the thoracic aorta are stable. Both lungs are clear. Previously seen area of airspace disease in the left lung base is no longer visualized. No evidence of pleural effusion. IMPRESSION: Stable mild cardiomegaly.  No active lung disease. Electronically Signed   By: Earle Gell M.D.   On: 06/23/2015 19:57   Ct Abdomen Pelvis W Contrast  07/15/2015  ADDENDUM REPORT: 07/15/2015 07:53 ADDENDUM: First impression should read: Periumbilical bowel containing hernia. There are 2  segments of small bowel entering this hernia. The superior segment is dilated with abrupt change in caliber suggesting that the hernia is causing partial small bowel obstruction. These results will be called to the ordering clinician or representative by the Radiologist Assistant, and communication documented in the PACS or zVision Dashboard. Electronically Signed   By: Genia Del M.D.   On: 07/15/2015 07:53  07/15/2015  CLINICAL DATA:  82 year old diabetic hypertensive male with history of stomach ulcers presenting with nausea and vomiting. Initial encounter. EXAM: CT ABDOMEN AND PELVIS WITH CONTRAST TECHNIQUE: Multidetector CT imaging of the abdomen and pelvis was performed using the standard protocol following bolus administration of intravenous contrast. CONTRAST:  134mL OMNIPAQUE IOHEXOL 300 MG/ML  SOLN COMPARISON:  No comparison CT. FINDINGS: Lower chest: Tiny calcified granulomas lung bases with areas of scarring without worrisome lesion noted. Hepatobiliary: No masses or other significant abnormality. Pancreas: No mass, inflammatory changes, or other significant abnormality. Spleen: Within normal limits in size and appearance. Adrenals/Urinary Tract: Multiple bilateral renal cysts measuring up  to 7 cm. Distortion of nonobstructing collecting system. No adrenal abnormality. Stomach/Bowel: Under distended stomach with thickened folds and limited evaluation for the possibility of underlying ulcer. Hiatal hernia. Periumbilical small bowel containing hernia. There are 2 segments of small bowel entering this hernia with the superior segment dilated suggesting hernia this may be causing partial obstruction. History of prior appendectomy. No free air or free fluid. Fatty containing inguinal hernias. Vascular/Lymphatic: Prominent calcified plaque aorta and aortic branches. No high-grade stenosis. Ectasia without focal aneurysm. Calcified plaque iliac arteries with common iliac artery ectasia bilaterally.  Narrowing origin of the celiac artery and superior mesenteric artery. No adenopathy. Reproductive: Slightly heterogeneous lobulated prostate gland. Clinical and laboratory correlation recommended. Other: None. Musculoskeletal: Facet degenerative changes L4-5 and L5-S1. Grade 1 anterior slip L4. Bilateral foraminal narrowing and spinal stenosis. Bilateral hip joint degenerative changes. Fusion sacroiliac joints. No worrisome orbital osseous abnormality. IMPRESSION: Periumbilical small bowel containing hernia. There are 2 segments of small bowel entering this hernia with the superior segment dilated suggesting hernia this may be causing partial obstruction. Under distended stomach with thickened folds and limited evaluation for the possibility of underlying ulcer. Hiatal hernia. Multiple bilateral renal cysts measuring up to 7 cm. Distortion of nonobstructing collecting system. Atherosclerotic changes as detailed above. Slightly heterogeneous lobulated prostate gland. Clinical and laboratory correlation recommended. Recommended. Electronically Signed: By: Genia Del M.D. On: 07/15/2015 07:47     CBC  Recent Labs Lab 07/13/15 1417 07/14/15 0548 07/15/15 0734  WBC 5.7 6.0 6.2  HGB 9.5* 9.2* 10.1*  HCT 29.2* 28.1* 30.0*  PLT 224 232 256  MCV 81.6 81.0 80.9  MCH 26.5 26.5 27.2  MCHC 32.5 32.7 33.7  RDW 14.0 13.7 13.8    Chemistries   Recent Labs Lab 07/13/15 1417 07/14/15 0548 07/14/15 1246 07/15/15 0734  NA 122* 124*  --  125*  K 4.1 3.5  --  3.5  CL 85* 85*  --  88*  CO2 26 27  --  29  GLUCOSE 179* 108*  --  108*  BUN 17 15  --  10  CREATININE 1.11 0.95  --  0.98  CALCIUM 8.5* 8.4*  --  8.2*  MG  --   --  1.9  --   AST 35  --   --  50*  ALT 23  --   --  23  ALKPHOS 48  --   --  43  BILITOT 0.5  --   --  0.5   ------------------------------------------------------------------------------------------------------------------ estimated creatinine clearance is 63.6 mL/min (by C-G  formula based on Cr of 0.98). ------------------------------------------------------------------------------------------------------------------ No results for input(s): HGBA1C in the last 72 hours. ------------------------------------------------------------------------------------------------------------------ No results for input(s): CHOL, HDL, LDLCALC, TRIG, CHOLHDL, LDLDIRECT in the last 72 hours. ------------------------------------------------------------------------------------------------------------------  Recent Labs  07/13/15 1817  TSH 0.511   ------------------------------------------------------------------------------------------------------------------ No results for input(s): VITAMINB12, FOLATE, FERRITIN, TIBC, IRON, RETICCTPCT in the last 72 hours.  Coagulation profile No results for input(s): INR, PROTIME in the last 168 hours.  No results for input(s): DDIMER in the last 72 hours.  Cardiac Enzymes No results for input(s): CKMB, TROPONINI, MYOGLOBIN in the last 168 hours.  Invalid input(s): CK ------------------------------------------------------------------------------------------------------------------ Invalid input(s): POCBNP   CBG:  Recent Labs Lab 07/14/15 1155 07/14/15 1649 07/14/15 2115 07/15/15 0755 07/15/15 1211  GLUCAP 181* 173* 88 106* 84       Studies: Dg Chest 2 View  07/15/2015  CLINICAL DATA:  82 year old male with cough and congestion for 4 days. Subsequent encounter.  EXAM: CHEST  2 VIEW COMPARISON:  06/23/2015. FINDINGS: Dilated calcified tortuous aorta. Heart size top-normal to minimally enlarged. Central pulmonary vascular prominence and minimal peribronchial thickening without frank pulmonary edema. No segmental consolidation or pneumothorax. IMPRESSION: Central pulmonary vascular prominence and minimal peribronchial thickening without frank pulmonary edema. Dilated calcified tortuous aorta. Electronically Signed   By: Genia Del M.D.   On: 07/15/2015 07:51   Ct Abdomen Pelvis W Contrast  07/15/2015  ADDENDUM REPORT: 07/15/2015 07:53 ADDENDUM: First impression should read: Periumbilical bowel containing hernia. There are 2 segments of small bowel entering this hernia. The superior segment is dilated with abrupt change in caliber suggesting that the hernia is causing partial small bowel obstruction. These results will be called to the ordering clinician or representative by the Radiologist Assistant, and communication documented in the PACS or zVision Dashboard. Electronically Signed   By: Genia Del M.D.   On: 07/15/2015 07:53  07/15/2015  CLINICAL DATA:  82 year old diabetic hypertensive male with history of stomach ulcers presenting with nausea and vomiting. Initial encounter. EXAM: CT ABDOMEN AND PELVIS WITH CONTRAST TECHNIQUE: Multidetector CT imaging of the abdomen and pelvis was performed using the standard protocol following bolus administration of intravenous contrast. CONTRAST:  12mL OMNIPAQUE IOHEXOL 300 MG/ML  SOLN COMPARISON:  No comparison CT. FINDINGS: Lower chest: Tiny calcified granulomas lung bases with areas of scarring without worrisome lesion noted. Hepatobiliary: No masses or other significant abnormality. Pancreas: No mass, inflammatory changes, or other significant abnormality. Spleen: Within normal limits in size and appearance. Adrenals/Urinary Tract: Multiple bilateral renal cysts measuring up to 7 cm. Distortion of nonobstructing collecting system. No adrenal abnormality. Stomach/Bowel: Under distended stomach with thickened folds and limited evaluation for the possibility of underlying ulcer. Hiatal hernia. Periumbilical small bowel containing hernia. There are 2 segments of small bowel entering this hernia with the superior segment dilated suggesting hernia this may be causing partial obstruction. History of prior appendectomy. No free air or free fluid. Fatty containing inguinal hernias.  Vascular/Lymphatic: Prominent calcified plaque aorta and aortic branches. No high-grade stenosis. Ectasia without focal aneurysm. Calcified plaque iliac arteries with common iliac artery ectasia bilaterally. Narrowing origin of the celiac artery and superior mesenteric artery. No adenopathy. Reproductive: Slightly heterogeneous lobulated prostate gland. Clinical and laboratory correlation recommended. Other: None. Musculoskeletal: Facet degenerative changes L4-5 and L5-S1. Grade 1 anterior slip L4. Bilateral foraminal narrowing and spinal stenosis. Bilateral hip joint degenerative changes. Fusion sacroiliac joints. No worrisome orbital osseous abnormality. IMPRESSION: Periumbilical small bowel containing hernia. There are 2 segments of small bowel entering this hernia with the superior segment dilated suggesting hernia this may be causing partial obstruction. Under distended stomach with thickened folds and limited evaluation for the possibility of underlying ulcer. Hiatal hernia. Multiple bilateral renal cysts measuring up to 7 cm. Distortion of nonobstructing collecting system. Atherosclerotic changes as detailed above. Slightly heterogeneous lobulated prostate gland. Clinical and laboratory correlation recommended. Recommended. Electronically Signed: By: Genia Del M.D. On: 07/15/2015 07:47      Lab Results  Component Value Date   HGBA1C 6.4* 07/30/2013   HGBA1C 5.7* 12/13/2012   HGBA1C * 10/28/2009    6.9 (NOTE)  According to the ADA Clinical Practice Recommendations for 2011, when HbA1c is used as a screening test:   >=6.5%   Diagnostic of Diabetes Mellitus           (if abnormal result  is confirmed)  5.7-6.4%   Increased risk of developing Diabetes Mellitus  References:Diagnosis and Classification of Diabetes Mellitus,Diabetes [id number] 1):S62-S69 and Standards of Medical Care in         Diabetes - 2011,Diabetes  [id number]  (Suppl 1):S11-S61.   Lab Results  Component Value Date   LDLCALC 60 12/13/2012   CREATININE 0.98 07/15/2015       Scheduled Meds: . amLODipine  10 mg Oral Daily  . atorvastatin  20 mg Oral q1800  . carvedilol  12.5 mg Oral BID WC  . docusate sodium  100 mg Oral BID  . enoxaparin (LOVENOX) injection  40 mg Subcutaneous Q24H  . ferrous sulfate  325 mg Oral BID  . hydrALAZINE  10 mg Oral TID  . insulin aspart  0-9 Units Subcutaneous TID WC  . insulin aspart protamine- aspart  10 Units Subcutaneous BID WC  . losartan  100 mg Oral Daily  . pantoprazole  40 mg Oral Daily  . sucralfate  1 g Oral TID WC & HS   Continuous Infusions: . sodium chloride 75 mL/hr at 07/15/15 [id number]    Principal Problem:   Acute confusional state of metabolic origin Active Problems:   CKD (chronic kidney disease) stage 3, GFR 30-59 ml/min   Hyponatremia   Diabetes mellitus with diabetic nephropathy (HCC)   Anemia of chronic disease   Acute confusion    Time spent: 45 minutes   [facility] Pager [phone]. If 7PM-7AM, please contact night-coverage at [email or web address], password [facility] 07/15/2015, 2:47 PM  LOS: 2 days

## 2015-07-15 NOTE — Consult Note (Signed)
Reason for Consult: PSBO, incisional hernia Referring Physician: Dr. Reyne Dumas   HPI: Jose Kennedy is a 82 year old male with a history of laryngeal dystonia, DM II, HTN, stroke, CAD who presented 3/19 with confusion.  He was found to have hyponatremia and constipation. A CT of abdomen and pelvis demonstrated a bowel containing hernia causing a PSBO.  We have therefore been asked to evaluate.  The patient is rather a poor historian.  I spoke with his daughter Ivin Booty who reports constipation over the last few weeks which prompted him to take laxatives and then developed diarrhea.  Denies nausea or vomiting.  Has laryngeal dystonia for 10 years which manifests as "dry heaving."  Only difference is the sensation of "having to bring something up."  Tolerating diet, having BMs.  He was unaware of hernia.  Previous surgeries include a appendectomy from which there is a midline incision.    Past Medical History  Diagnosis Date  . Stroke ([place])   . Coronary artery disease   . Hypertension   . GERD (gastroesophageal reflux disease)   . History of stomach ulcers   . Heart murmur   . Diabetes mellitus     insulin dependent  . Laryngeal dystonia     chronic, appears as it he's dry heaving    Past Surgical History  Procedure Laterality Date  . Laparoscopic cholecystectomy    . Back surgery    . Thyroid nodual    . Appendectomy    . Esophagogastroduodenoscopy  08/26/2011    Procedure: ESOPHAGOGASTRODUODENOSCOPY (EGD);  Surgeon: Wonda Horner, MD;  Location: [facility] ENDOSCOPY;  Service: Endoscopy;  Laterality: N/A;  . Esophagogastroduodenoscopy N/A 09/20/2013    Procedure: ESOPHAGOGASTRODUODENOSCOPY (EGD);  Surgeon: Jeryl Columbia, MD;  Location: [facility] ENDOSCOPY;  Service: Endoscopy;  Laterality: N/A;    History reviewed. No pertinent family history.  Social History:  reports that he has quit smoking. He has never used smokeless tobacco. He reports that he does not drink alcohol or use illicit  drugs.  Allergies: No Known Allergies  Medications:  Scheduled Meds: . amLODipine  10 mg Oral Daily  . atorvastatin  20 mg Oral q1800  . carvedilol  12.5 mg Oral BID WC  . docusate sodium  100 mg Oral BID  . enoxaparin (LOVENOX) injection  40 mg Subcutaneous Q24H  . ferrous sulfate  325 mg Oral BID  . hydrALAZINE  10 mg Oral TID  . insulin aspart  0-9 Units Subcutaneous TID WC  . insulin aspart protamine- aspart  10 Units Subcutaneous BID WC  . losartan  100 mg Oral Daily  . pantoprazole  40 mg Oral Daily  . sucralfate  1 g Oral TID WC & HS   Continuous Infusions: . sodium chloride 75 mL/hr at 07/15/15 0916   PRN Meds:.ondansetron **OR** ondansetron (ZOFRAN) IV   Results for orders placed or performed during the hospital encounter of 07/13/15 (from the past 48 hour(s))  CBG monitoring, ED     Status: Abnormal   Collection Time: 07/13/15  2:16 PM  Result Value Ref Range   Glucose-Capillary 175 (H) 65 - 99 mg/dL  Comprehensive metabolic panel     Status: Abnormal   Collection Time: 07/13/15  2:17 PM  Result Value Ref Range   Sodium 122 (L) 135 - 145 mmol/L   Potassium 4.1 3.5 - 5.1 mmol/L   Chloride 85 (L) 101 - 111 mmol/L   CO2 26 22 - 32 mmol/L   Glucose, Bld 179 (  H) 65 - 99 mg/dL   BUN 17 6 - 20 mg/dL   Creatinine, Ser 1.11 0.61 - 1.24 mg/dL   Calcium 8.5 (L) 8.9 - 10.3 mg/dL   Total Protein 6.2 (L) 6.5 - 8.1 g/dL   Albumin 3.2 (L) 3.5 - 5.0 g/dL   AST 35 15 - 41 U/L   ALT 23 17 - 63 U/L   Alkaline Phosphatase 48 38 - 126 U/L   Total Bilirubin 0.5 0.3 - 1.2 mg/dL   GFR calc non Af Amer 59 (L) >60 mL/min   GFR calc Af Amer >60 >60 mL/min    Comment: (NOTE) The eGFR has been calculated using the CKD EPI equation. This calculation has not been validated in all clinical situations. eGFR's persistently <60 mL/min signify possible Chronic Kidney Disease.    Anion gap 11 5 - 15  CBC     Status: Abnormal   Collection Time: 07/13/15  2:17 PM  Result Value Ref Range    WBC 5.7 4.0 - 10.5 K/uL   RBC 3.58 (L) 4.22 - 5.81 MIL/uL   Hemoglobin 9.5 (L) 13.0 - 17.0 g/dL   HCT 29.2 (L) 39.0 - 52.0 %   MCV 81.6 78.0 - 100.0 fL   MCH 26.5 26.0 - 34.0 pg   MCHC 32.5 30.0 - 36.0 g/dL   RDW 14.0 11.5 - 15.5 %   Platelets 224 150 - 400 K/uL  Uric acid     Status: Abnormal   Collection Time: 07/13/15  6:17 PM  Result Value Ref Range   Uric Acid, Serum 3.8 (L) 4.4 - 7.6 mg/dL  TSH     Status: None   Collection Time: 07/13/15  6:17 PM  Result Value Ref Range   TSH 0.511 0.350 - 4.500 uIU/mL  Cortisol     Status: None   Collection Time: 07/13/15  6:17 PM  Result Value Ref Range   Cortisol, Plasma 11.7 ug/dL    Comment: (NOTE) AM    6.7 - 22.6 ug/dL PM   <10.0       ug/dL   Osmolality     Status: Abnormal   Collection Time: 07/13/15  6:17 PM  Result Value Ref Range   Osmolality 254 (L) 275 - 295 mOsm/kg  Creatinine, urine, random     Status: None   Collection Time: 07/13/15  7:28 PM  Result Value Ref Range   Creatinine, Urine 77.93 mg/dL  Na and K (sodium & potassium), rand urine     Status: None   Collection Time: 07/13/15  7:28 PM  Result Value Ref Range   Sodium, Ur 91 mmol/L   Potassium Urine Timed 52 mmol/L  Uric acid, random urine     Status: None   Collection Time: 07/13/15  7:28 PM  Result Value Ref Range   Uric Acid, Urine 37.4 Not Estab. mg/dL    Comment: (NOTE) Performed At: [facility] [address] [place], [name] [id number] Lindon Romp MD IW:[id number]   Osmolality, urine     Status: None   Collection Time: 07/13/15  7:28 PM  Result Value Ref Range   Osmolality, Ur 458 300 - 900 mOsm/kg  Urinalysis, Routine w reflex microscopic (not at [facility])     Status: None   Collection Time: 07/13/15  7:29 PM  Result Value Ref Range   Color, Urine YELLOW YELLOW   APPearance CLEAR CLEAR   Specific Gravity, Urine 1.014 1.005 - 1.030   pH 7.5 5.0 - 8.0  Glucose, UA NEGATIVE NEGATIVE mg/dL   Hgb urine dipstick NEGATIVE  NEGATIVE   Bilirubin Urine NEGATIVE NEGATIVE   Ketones, ur NEGATIVE NEGATIVE mg/dL   Protein, ur NEGATIVE NEGATIVE mg/dL   Nitrite NEGATIVE NEGATIVE   Leukocytes, UA NEGATIVE NEGATIVE    Comment: MICROSCOPIC NOT DONE ON URINES WITH NEGATIVE PROTEIN, BLOOD, LEUKOCYTES, NITRITE, OR GLUCOSE <1000 mg/dL.  Glucose, capillary     Status: Abnormal   Collection Time: 07/13/15  9:34 PM  Result Value Ref Range   Glucose-Capillary 143 (H) 65 - 99 mg/dL   Comment 1 Notify RN    Comment 2 Document in Chart   Basic metabolic panel     Status: Abnormal   Collection Time: 07/14/15  5:48 AM  Result Value Ref Range   Sodium 124 (L) 135 - 145 mmol/L   Potassium 3.5 3.5 - 5.1 mmol/L   Chloride 85 (L) 101 - 111 mmol/L   CO2 27 22 - 32 mmol/L   Glucose, Bld 108 (H) 65 - 99 mg/dL   BUN 15 6 - 20 mg/dL   Creatinine, Ser 0.95 0.61 - 1.24 mg/dL   Calcium 8.4 (L) 8.9 - 10.3 mg/dL   GFR calc non Af Amer >60 >60 mL/min   GFR calc Af Amer >60 >60 mL/min    Comment: (NOTE) The eGFR has been calculated using the CKD EPI equation. This calculation has not been validated in all clinical situations. eGFR's persistently <60 mL/min signify possible Chronic Kidney Disease.    Anion gap 12 5 - 15  CBC     Status: Abnormal   Collection Time: 07/14/15  5:48 AM  Result Value Ref Range   WBC 6.0 4.0 - 10.5 K/uL   RBC 3.47 (L) 4.22 - 5.81 MIL/uL   Hemoglobin 9.2 (L) 13.0 - 17.0 g/dL   HCT 28.1 (L) 39.0 - 52.0 %   MCV 81.0 78.0 - 100.0 fL   MCH 26.5 26.0 - 34.0 pg   MCHC 32.7 30.0 - 36.0 g/dL   RDW 13.7 11.5 - 15.5 %   Platelets 232 150 - 400 K/uL  Glucose, capillary     Status: Abnormal   Collection Time: 07/14/15  8:20 AM  Result Value Ref Range   Glucose-Capillary 118 (H) 65 - 99 mg/dL  Glucose, capillary     Status: Abnormal   Collection Time: 07/14/15 11:55 AM  Result Value Ref Range   Glucose-Capillary 181 (H) 65 - 99 mg/dL  Magnesium     Status: None   Collection Time: 07/14/15 12:46 PM  Result  Value Ref Range   Magnesium 1.9 1.7 - 2.4 mg/dL  Glucose, capillary     Status: Abnormal   Collection Time: 07/14/15  4:49 PM  Result Value Ref Range   Glucose-Capillary 173 (H) 65 - 99 mg/dL  Glucose, capillary     Status: None   Collection Time: 07/14/15  9:15 PM  Result Value Ref Range   Glucose-Capillary 88 65 - 99 mg/dL   Comment 1 Notify RN    Comment 2 Document in Chart   Comprehensive metabolic panel     Status: Abnormal   Collection Time: 07/15/15  7:34 AM  Result Value Ref Range   Sodium 125 (L) 135 - 145 mmol/L   Potassium 3.5 3.5 - 5.1 mmol/L   Chloride 88 (L) 101 - 111 mmol/L   CO2 29 22 - 32 mmol/L   Glucose, Bld 108 (H) 65 - 99 mg/dL   BUN 10 6 - 20  mg/dL   Creatinine, Ser 0.98 0.61 - 1.24 mg/dL   Calcium 8.2 (L) 8.9 - 10.3 mg/dL   Total Protein 5.5 (L) 6.5 - 8.1 g/dL   Albumin 2.9 (L) 3.5 - 5.0 g/dL   AST 50 (H) 15 - 41 U/L   ALT 23 17 - 63 U/L   Alkaline Phosphatase 43 38 - 126 U/L   Total Bilirubin 0.5 0.3 - 1.2 mg/dL   GFR calc non Af Amer >60 >60 mL/min   GFR calc Af Amer >60 >60 mL/min    Comment: (NOTE) The eGFR has been calculated using the CKD EPI equation. This calculation has not been validated in all clinical situations. eGFR's persistently <60 mL/min signify possible Chronic Kidney Disease.    Anion gap 8 5 - 15  CBC     Status: Abnormal   Collection Time: 07/15/15  7:34 AM  Result Value Ref Range   WBC 6.2 4.0 - 10.5 K/uL   RBC 3.71 (L) 4.22 - 5.81 MIL/uL   Hemoglobin 10.1 (L) 13.0 - 17.0 g/dL   HCT 30.0 (L) 39.0 - 52.0 %   MCV 80.9 78.0 - 100.0 fL   MCH 27.2 26.0 - 34.0 pg   MCHC 33.7 30.0 - 36.0 g/dL   RDW 13.8 11.5 - 15.5 %   Platelets 256 150 - 400 K/uL  Glucose, capillary     Status: Abnormal   Collection Time: 07/15/15  7:55 AM  Result Value Ref Range   Glucose-Capillary 106 (H) 65 - 99 mg/dL    Dg Chest 2 View  07/15/2015  CLINICAL DATA:  82 year old male with cough and congestion for 4 days. Subsequent encounter. EXAM:  CHEST  2 VIEW COMPARISON:  06/23/2015. FINDINGS: Dilated calcified tortuous aorta. Heart size top-normal to minimally enlarged. Central pulmonary vascular prominence and minimal peribronchial thickening without frank pulmonary edema. No segmental consolidation or pneumothorax. IMPRESSION: Central pulmonary vascular prominence and minimal peribronchial thickening without frank pulmonary edema. Dilated calcified tortuous aorta. Electronically Signed   By: Genia Del M.D.   On: 07/15/2015 07:51   Ct Abdomen Pelvis W Contrast  07/15/2015  ADDENDUM REPORT: 07/15/2015 07:53 ADDENDUM: First impression should read: Periumbilical bowel containing hernia. There are 2 segments of small bowel entering this hernia. The superior segment is dilated with abrupt change in caliber suggesting that the hernia is causing partial small bowel obstruction. These results will be called to the ordering clinician or representative by the Radiologist Assistant, and communication documented in the PACS or zVision Dashboard. Electronically Signed   By: Genia Del M.D.   On: 07/15/2015 07:53  07/15/2015  CLINICAL DATA:  82 year old diabetic hypertensive male with history of stomach ulcers presenting with nausea and vomiting. Initial encounter. EXAM: CT ABDOMEN AND PELVIS WITH CONTRAST TECHNIQUE: Multidetector CT imaging of the abdomen and pelvis was performed using the standard protocol following bolus administration of intravenous contrast. CONTRAST:  110m OMNIPAQUE IOHEXOL 300 MG/ML  SOLN COMPARISON:  No comparison CT. FINDINGS: Lower chest: Tiny calcified granulomas lung bases with areas of scarring without worrisome lesion noted. Hepatobiliary: No masses or other significant abnormality. Pancreas: No mass, inflammatory changes, or other significant abnormality. Spleen: Within normal limits in size and appearance. Adrenals/Urinary Tract: Multiple bilateral renal cysts measuring up to 7 cm. Distortion of nonobstructing collecting  system. No adrenal abnormality. Stomach/Bowel: Under distended stomach with thickened folds and limited evaluation for the possibility of underlying ulcer. Hiatal hernia. Periumbilical small bowel containing hernia. There are 2 segments of small bowel entering  this hernia with the superior segment dilated suggesting hernia this may be causing partial obstruction. History of prior appendectomy. No free air or free fluid. Fatty containing inguinal hernias. Vascular/Lymphatic: Prominent calcified plaque aorta and aortic branches. No high-grade stenosis. Ectasia without focal aneurysm. Calcified plaque iliac arteries with common iliac artery ectasia bilaterally. Narrowing origin of the celiac artery and superior mesenteric artery. No adenopathy. Reproductive: Slightly heterogeneous lobulated prostate gland. Clinical and laboratory correlation recommended. Other: None. Musculoskeletal: Facet degenerative changes L4-5 and L5-S1. Grade 1 anterior slip L4. Bilateral foraminal narrowing and spinal stenosis. Bilateral hip joint degenerative changes. Fusion sacroiliac joints. No worrisome orbital osseous abnormality. IMPRESSION: Periumbilical small bowel containing hernia. There are 2 segments of small bowel entering this hernia with the superior segment dilated suggesting hernia this may be causing partial obstruction. Under distended stomach with thickened folds and limited evaluation for the possibility of underlying ulcer. Hiatal hernia. Multiple bilateral renal cysts measuring up to 7 cm. Distortion of nonobstructing collecting system. Atherosclerotic changes as detailed above. Slightly heterogeneous lobulated prostate gland. Clinical and laboratory correlation recommended. Recommended. Electronically Signed: By: Genia Del M.D. On: 07/15/2015 07:47    Review of Systems  Constitutional: Negative for fever, chills, malaise/fatigue and diaphoresis.  Respiratory: Negative for cough, hemoptysis, sputum production,  shortness of breath and wheezing.   Cardiovascular: Negative for chest pain, palpitations, orthopnea, claudication, leg swelling and PND.  Gastrointestinal: Negative for nausea, vomiting, abdominal pain, diarrhea, constipation and blood in stool.  Genitourinary: Negative for dysuria, urgency, frequency, hematuria and flank pain.  Neurological: Negative for dizziness, tingling, tremors, sensory change, speech change, focal weakness, seizures, loss of consciousness and weakness.   Blood pressure 147/77, pulse 63, temperature 99 F (37.2 C), temperature source Oral, resp. rate 20, height '5\' 11"'$  (1.803 m), weight 91 kg (200 lb 9.9 oz), SpO2 99 %. Physical Exam  Constitutional: He is oriented to person, place, and time. He appears well-developed and well-nourished. No distress.  Cardiovascular: Normal rate, regular rhythm, normal heart sounds and intact distal pulses.  Exam reveals no gallop and no friction rub.   No murmur heard. Respiratory: Effort normal and breath sounds normal. No respiratory distress. He has no wheezes. He has no rales. He exhibits no tenderness.  GI: Soft. Bowel sounds are normal. He exhibits no distension and no mass. There is no tenderness. There is no rebound and no guarding.  Reducible ventral hernia, non tender.   Musculoskeletal: Normal range of motion. He exhibits no edema or tenderness.  Neurological: He is alert and oriented to person, place, and time.  Skin: Skin is warm and dry. He is not diaphoretic.  Psychiatric: He has a normal mood and affect. His behavior is normal. Judgment and thought content normal.    Assessment/Plan: Incisional hernia-it may intermittently be causing a PSBO, however, the patient has been asymptomatic.  NPO for now, no indications for NGT decompression as it is reducible.  Further recommendations to follow. Laryngeal dystonia-at baseline, pt is not vomiting.   Krithi Bray ANP-BC 07/15/2015, 10:08 AM

## 2015-07-15 NOTE — Progress Notes (Signed)
Provider Abrol notified of abdominal CT results. Awaiting further orders.

## 2015-07-16 LAB — BASIC METABOLIC PANEL
ANION GAP: 7 (ref 5–15)
BUN: 9 mg/dL (ref 6–20)
CALCIUM: 8.2 mg/dL — AB (ref 8.9–10.3)
CHLORIDE: 93 mmol/L — AB (ref 101–111)
CO2: 28 mmol/L (ref 22–32)
Creatinine, Ser: 0.88 mg/dL (ref 0.61–1.24)
GFR calc Af Amer: >60 mL/min (ref >60)
GFR calc non Af Amer: >60 mL/min (ref >60)
GLUCOSE: 133 mg/dL — AB (ref 65–99)
Potassium: 3.4 mmol/L — ABNORMAL LOW (ref 3.5–5.1)
Sodium: 128 mmol/L — ABNORMAL LOW (ref 135–145)

## 2015-07-16 LAB — GLUCOSE, CAPILLARY
Glucose-Capillary: 111 mg/dL — ABNORMAL HIGH (ref 65–99)
Glucose-Capillary: 215 mg/dL — ABNORMAL HIGH (ref 65–99)

## 2015-07-16 MED ORDER — ATORVASTATIN CALCIUM 20 MG PO TABS: 20.0000 mg | ORAL_TABLET | Freq: Every day | ORAL | Status: DC

## 2015-07-16 MED ORDER — POTASSIUM CHLORIDE CRYS ER 20 MEQ PO TBCR: 40.0000 meq | EXTENDED_RELEASE_TABLET | Freq: Once | ORAL | Status: DC

## 2015-07-16 MED ORDER — POTASSIUM CHLORIDE CRYS ER 20 MEQ PO TBCR
40.0000 meq | EXTENDED_RELEASE_TABLET | Freq: Once | ORAL | Status: AC
  Administered 2015-07-16: 40 meq via ORAL
  Filled 2015-07-16: qty 2

## 2015-07-16 MED ORDER — POTASSIUM CHLORIDE IN NACL 40-0.9 MEQ/L-% IV SOLN
INTRAVENOUS | Status: DC
  Administered 2015-07-16: 75 mL/h via INTRAVENOUS
  Filled 2015-07-16 (×2): qty 1000

## 2015-07-16 NOTE — Discharge Instructions (Addendum)
Dysphagia 3 (Mech soft);Thin liquid   Liquid Administration via: Cup;Straw Medication Administration: Whole meds with liquid Supervision: Patient able to self feed;Intermittent supervision to cue for compensatory strategies Compensations: Slow rate;Small sips/bites;Follow solids with liquid Postural Changes: Seated upright at 90 degrees;Remain upright for at least 30 minutes after po intake

## 2015-07-16 NOTE — Discharge Summary (Signed)
Physician Discharge Summary  Jose Kennedy MRN: [id number] DOB/AGE: [birth year] 82 y.o.  PCP: Pcp Not In System   Admit date: 07/13/2015 Discharge date: 07/16/2015  Discharge Diagnoses:     Principal Problem:   Acute confusional state of metabolic origin Active Problems:   CKD (chronic kidney disease) stage 3, GFR 30-59 ml/min   Hyponatremia   Diabetes mellitus with diabetic nephropathy (HCC)   Anemia of chronic disease   Acute confusion    Follow-up recommendations Follow-up with PCP in 3-5 days , including all  additional recommended appointments as below Follow-up CBC, CMP in 3-5 days    Speech therapy recommends Dysphagia 3 (Mech soft);Thin liquid      Medication List    STOP taking these medications        hydrochlorothiazide 25 MG tablet  Commonly known as:  HYDRODIURIL      TAKE these medications        amLODipine 10 MG tablet  Commonly known as:  NORVASC  Take 10 mg by mouth daily.     aspirin EC 81 MG tablet  Take 81 mg by mouth daily.     atorvastatin 20 MG tablet  Commonly known as:  LIPITOR  Take 1 tablet (20 mg total) by mouth daily at 6 PM.     carvedilol 12.5 MG tablet  Commonly known as:  COREG  Take 1 tablet (12.5 mg total) by mouth 2 (two) times daily with a meal.     docusate sodium 100 MG capsule  Commonly known as:  COLACE  Take 100 mg by mouth 2 (two) times daily.     ferrous sulfate 325 (65 FE) MG tablet  Take 325 mg by mouth daily with breakfast.     hydrALAZINE 10 MG tablet  Commonly known as:  APRESOLINE  Take 10 mg by mouth 3 (three) times daily.     insulin NPH-regular Human (70-30) 100 UNIT/ML injection  Commonly known as:  NOVOLIN 70/30  Inject 8  Units into the skin 2 (two) times daily with a meal.     losartan 100 MG tablet  Commonly known as:  COZAAR  Take 100 mg by mouth daily.     Omeprazole-Sodium Bicarbonate 20-1100 MG Caps capsule  Commonly known as:  ZEGERID  Take 1 capsule by mouth daily before breakfast.      potassium chloride SA 20 MEQ tablet  Commonly known as:  K-DUR,KLOR-CON  Take 2 tablets (40 mEq total) by mouth once.     pravastatin 80 MG tablet  Commonly known as:  PRAVACHOL  Take 40 mg by mouth at bedtime.     sucralfate 1 g tablet  Commonly known as:  CARAFATE  Take 1 g by mouth 4 (four) times daily.         Discharge Condition: Stable   Discharge Instructions Get Medicines reviewed and adjusted: Please take all your medications with you for your next visit with your Primary MD  Please request your Primary MD to go over all hospital tests and procedure/radiological results at the follow up, please ask your Primary MD to get all Hospital records sent to his/her office.  If you experience worsening of your admission symptoms, develop shortness of breath, life threatening emergency, suicidal or homicidal thoughts you must seek medical attention immediately by calling 911 or calling your MD immediately if symptoms less severe.  You must read complete instructions/literature along with all the possible adverse reactions/side effects for all the Medicines you take and that have  been prescribed to you. Take any new Medicines after you have completely understood and accpet all the possible adverse reactions/side effects.   Do not drive when taking Pain medications.   Do not take more than prescribed Pain, Sleep and Anxiety Medications  Special Instructions: If you have smoked or chewed Tobacco in the last 2 yrs please stop smoking, stop any regular Alcohol and or any Recreational drug use.  Wear Seat belts while driving.  Please note  You were cared for by a hospitalist during your hospital stay. Once you are discharged, your primary care physician will handle any further medical issues. Please note that NO REFILLS for any discharge medications will be authorized once you are discharged, as it is imperative that you return to your primary care physician (or establish a  relationship with a primary care physician if you do not have one) for your aftercare needs so that they can reassess your need for medications and monitor your lab values.      Discharge Instructions    Diet - low sodium heart healthy    Complete by:  As directed      Diet - low sodium heart healthy    Complete by:  As directed      Increase activity slowly    Complete by:  As directed      Increase activity slowly    Complete by:  As directed            No Known Allergies    Disposition: 01-Home or Self Care   Consults:  General surgery    Significant Diagnostic Studies:  Dg Chest 2 View  07/15/2015  CLINICAL DATA:  82 year old male with cough and congestion for 4 days. Subsequent encounter. EXAM: CHEST  2 VIEW COMPARISON:  06/23/2015. FINDINGS: Dilated calcified tortuous aorta. Heart size top-normal to minimally enlarged. Central pulmonary vascular prominence and minimal peribronchial thickening without frank pulmonary edema. No segmental consolidation or pneumothorax. IMPRESSION: Central pulmonary vascular prominence and minimal peribronchial thickening without frank pulmonary edema. Dilated calcified tortuous aorta. Electronically Signed   By: Genia Del M.D.   On: 07/15/2015 07:51   Dg Chest 2 View  06/23/2015  CLINICAL DATA:  Productive cough and shortness of breath today. Mid chest pain. Former smoker. EXAM: CHEST  2 VIEW COMPARISON:  09/21/2013 FINDINGS: Mild cardiomegaly and ectasia the thoracic aorta are stable. Both lungs are clear. Previously seen area of airspace disease in the left lung base is no longer visualized. No evidence of pleural effusion. IMPRESSION: Stable mild cardiomegaly.  No active lung disease. Electronically Signed   By: Earle Gell M.D.   On: 06/23/2015 19:57   Ct Abdomen Pelvis W Contrast  07/15/2015  ADDENDUM REPORT: 07/15/2015 07:53 ADDENDUM: First impression should read: Periumbilical bowel containing hernia. There are 2 segments of small  bowel entering this hernia. The superior segment is dilated with abrupt change in caliber suggesting that the hernia is causing partial small bowel obstruction. These results will be called to the ordering clinician or representative by the Radiologist Assistant, and communication documented in the PACS or zVision Dashboard. Electronically Signed   By: Genia Del M.D.   On: 07/15/2015 07:53  07/15/2015  CLINICAL DATA:  82 year old diabetic hypertensive male with history of stomach ulcers presenting with nausea and vomiting. Initial encounter. EXAM: CT ABDOMEN AND PELVIS WITH CONTRAST TECHNIQUE: Multidetector CT imaging of the abdomen and pelvis was performed using the standard protocol following bolus administration of intravenous contrast. CONTRAST:  137m  OMNIPAQUE IOHEXOL 300 MG/ML  SOLN COMPARISON:  No comparison CT. FINDINGS: Lower chest: Tiny calcified granulomas lung bases with areas of scarring without worrisome lesion noted. Hepatobiliary: No masses or other significant abnormality. Pancreas: No mass, inflammatory changes, or other significant abnormality. Spleen: Within normal limits in size and appearance. Adrenals/Urinary Tract: Multiple bilateral renal cysts measuring up to 7 cm. Distortion of nonobstructing collecting system. No adrenal abnormality. Stomach/Bowel: Under distended stomach with thickened folds and limited evaluation for the possibility of underlying ulcer. Hiatal hernia. Periumbilical small bowel containing hernia. There are 2 segments of small bowel entering this hernia with the superior segment dilated suggesting hernia this may be causing partial obstruction. History of prior appendectomy. No free air or free fluid. Fatty containing inguinal hernias. Vascular/Lymphatic: Prominent calcified plaque aorta and aortic branches. No high-grade stenosis. Ectasia without focal aneurysm. Calcified plaque iliac arteries with common iliac artery ectasia bilaterally. Narrowing origin of the  celiac artery and superior mesenteric artery. No adenopathy. Reproductive: Slightly heterogeneous lobulated prostate gland. Clinical and laboratory correlation recommended. Other: None. Musculoskeletal: Facet degenerative changes L4-5 and L5-S1. Grade 1 anterior slip L4. Bilateral foraminal narrowing and spinal stenosis. Bilateral hip joint degenerative changes. Fusion sacroiliac joints. No worrisome orbital osseous abnormality. IMPRESSION: Periumbilical small bowel containing hernia. There are 2 segments of small bowel entering this hernia with the superior segment dilated suggesting hernia this may be causing partial obstruction. Under distended stomach with thickened folds and limited evaluation for the possibility of underlying ulcer. Hiatal hernia. Multiple bilateral renal cysts measuring up to 7 cm. Distortion of nonobstructing collecting system. Atherosclerotic changes as detailed above. Slightly heterogeneous lobulated prostate gland. Clinical and laboratory correlation recommended. Recommended. Electronically Signed: By: Genia Del M.D. On: 07/15/2015 07:47       Filed Weights   07/14/15 0440 07/15/15 0452 07/16/15 0405  Weight: 88.6 kg (195 lb 5.2 oz) 91 kg (200 lb 9.9 oz) 91 kg (200 lb 9.9 oz)     Microbiology: Recent Results (from the past 240 hour(s))  Urine culture     Status: None   Collection Time: 07/13/15  7:05 PM  Result Value Ref Range Status   Specimen Description URINE, RANDOM  Final   Special Requests NONE  Final   Culture MULTIPLE SPECIES PRESENT, SUGGEST RECOLLECTION  Final   Report Status 07/15/2015 FINAL  Final       Blood Culture    Component Value Date/Time   SDES URINE, RANDOM 07/13/2015 1905   SPECREQUEST NONE 07/13/2015 1905   CULT MULTIPLE SPECIES PRESENT, SUGGEST RECOLLECTION 07/13/2015 1905   REPTSTATUS 07/15/2015 FINAL 07/13/2015 1905      Labs: Results for orders placed or performed during the hospital encounter of 07/13/15 (from the past 48  hour(s))  Glucose, capillary     Status: Abnormal   Collection Time: 07/14/15 11:55 AM  Result Value Ref Range   Glucose-Capillary 181 (H) 65 - 99 mg/dL  Magnesium     Status: None   Collection Time: 07/14/15 12:46 PM  Result Value Ref Range   Magnesium 1.9 1.7 - 2.4 mg/dL  Glucose, capillary     Status: Abnormal   Collection Time: 07/14/15  4:49 PM  Result Value Ref Range   Glucose-Capillary 173 (H) 65 - 99 mg/dL  Glucose, capillary     Status: None   Collection Time: 07/14/15  9:15 PM  Result Value Ref Range   Glucose-Capillary 88 65 - 99 mg/dL   Comment 1 Notify RN    Comment 2  Document in Chart   Comprehensive metabolic panel     Status: Abnormal   Collection Time: 07/15/15  7:34 AM  Result Value Ref Range   Sodium 125 (L) 135 - 145 mmol/L   Potassium 3.5 3.5 - 5.1 mmol/L   Chloride 88 (L) 101 - 111 mmol/L   CO2 29 22 - 32 mmol/L   Glucose, Bld 108 (H) 65 - 99 mg/dL   BUN 10 6 - 20 mg/dL   Creatinine, Ser 0.98 0.61 - 1.24 mg/dL   Calcium 8.2 (L) 8.9 - 10.3 mg/dL   Total Protein 5.5 (L) 6.5 - 8.1 g/dL   Albumin 2.9 (L) 3.5 - 5.0 g/dL   AST 50 (H) 15 - 41 U/L   ALT 23 17 - 63 U/L   Alkaline Phosphatase 43 38 - 126 U/L   Total Bilirubin 0.5 0.3 - 1.2 mg/dL   GFR calc non Af Amer >60 >60 mL/min   GFR calc Af Amer >60 >60 mL/min    Comment: (NOTE) The eGFR has been calculated using the CKD EPI equation. This calculation has not been validated in all clinical situations. eGFR's persistently <60 mL/min signify possible Chronic Kidney Disease.    Anion gap 8 5 - 15  CBC     Status: Abnormal   Collection Time: 07/15/15  7:34 AM  Result Value Ref Range   WBC 6.2 4.0 - 10.5 K/uL   RBC 3.71 (L) 4.22 - 5.81 MIL/uL   Hemoglobin 10.1 (L) 13.0 - 17.0 g/dL   HCT 30.0 (L) 39.0 - 52.0 %   MCV 80.9 78.0 - 100.0 fL   MCH 27.2 26.0 - 34.0 pg   MCHC 33.7 30.0 - 36.0 g/dL   RDW 13.8 11.5 - 15.5 %   Platelets 256 150 - 400 K/uL  Glucose, capillary     Status: Abnormal    Collection Time: 07/15/15  7:55 AM  Result Value Ref Range   Glucose-Capillary 106 (H) 65 - 99 mg/dL  Glucose, capillary     Status: None   Collection Time: 07/15/15 12:11 PM  Result Value Ref Range   Glucose-Capillary 84 65 - 99 mg/dL  Glucose, capillary     Status: Abnormal   Collection Time: 07/15/15  5:11 PM  Result Value Ref Range   Glucose-Capillary 116 (H) 65 - 99 mg/dL  Glucose, capillary     Status: Abnormal   Collection Time: 07/15/15  9:15 PM  Result Value Ref Range   Glucose-Capillary 47 (L) 65 - 99 mg/dL   Comment 1 Notify RN    Comment 2 Document in Chart   Glucose, capillary     Status: None   Collection Time: 07/15/15 10:03 PM  Result Value Ref Range   Glucose-Capillary 95 65 - 99 mg/dL   Comment 1 Notify RN    Comment 2 Document in Chart   Basic metabolic panel     Status: Abnormal   Collection Time: 07/16/15  5:26 AM  Result Value Ref Range   Sodium 128 (L) 135 - 145 mmol/L   Potassium 3.4 (L) 3.5 - 5.1 mmol/L   Chloride 93 (L) 101 - 111 mmol/L   CO2 28 22 - 32 mmol/L   Glucose, Bld 133 (H) 65 - 99 mg/dL   BUN 9 6 - 20 mg/dL   Creatinine, Ser 0.88 0.61 - 1.24 mg/dL   Calcium 8.2 (L) 8.9 - 10.3 mg/dL   GFR calc non Af Amer >60 >60 mL/min   GFR calc Af  Amer >60 >60 mL/min    Comment: (NOTE) The eGFR has been calculated using the CKD EPI equation. This calculation has not been validated in all clinical situations. eGFR's persistently <60 mL/min signify possible Chronic Kidney Disease.    Anion gap 7 5 - 15  Glucose, capillary     Status: Abnormal   Collection Time: 07/16/15  7:47 AM  Result Value Ref Range   Glucose-Capillary 111 (H) 65 - 99 mg/dL     Lipid Panel     Component Value Date/Time   CHOL 127 12/13/2012 0834   TRIG 65 12/13/2012 0834   HDL 54 12/13/2012 0834   CHOLHDL 2.4 12/13/2012 0834   VLDL 13 12/13/2012 0834   LDLCALC 60 12/13/2012 0834     Lab Results  Component Value Date   HGBA1C 6.4* 07/30/2013   HGBA1C 5.7*  12/13/2012   HGBA1C * 10/28/2009    6.9 (NOTE)                                                                       According to the ADA Clinical Practice Recommendations for 2011, when HbA1c is used as a screening test:   >=6.5%   Diagnostic of Diabetes Mellitus           (if abnormal result  is confirmed)  5.7-6.4%   Increased risk of developing Diabetes Mellitus  References:Diagnosis and Classification of Diabetes Mellitus,Diabetes NATF,5732,20(URKYH 1):S62-S69 and Standards of Medical Care in         Diabetes - 2011,Diabetes Care,2011,34  (Suppl 1):S11-S61.     Lab Results  Component Value Date   LDLCALC 60 12/13/2012   CREATININE 0.88 07/16/2015     82 yo male with HTN, DM, who presented to [facility] ED with main concern of confusion. Please note that pt is unable to provide medical history due to confusion, family at bedside explained that pt was constipated and was taking multiple stool softeners and was finally having BM's but it turned into multiple episodes of diarrhea. Family noted pt more confused and unable to get out of the bed. No reports of chest pain or shortness of breath, no abd or urinary concerns reported.   In ED, pt is hemodynamically stable, VSS, blood work notable for Na 122, otherwise unremarkable, TRH asked to admit for further evaluation.  Assessment and Plan: Principal Problem:  Acute confusional state of metabolic origin secondary to hyponatremia - secondary to hyponatremia, poor oral intake, dehydration in the setting of diarrhea  - evaluate the cause of hyponatremia Continue IV fluids, sodium improving - PTeval once pt more medically stable  Previously on dysphagia 3 diet for aspiration,  Repeat chest x-ray no pneumonia   Incisional hernia-it may intermittently be causing a PSBO, however, the patient has been asymptomatic.Clinically the patient is not obstructed. Hernia is reducible. No need for emergent surgery. Patient seen by general surgery this  morning. Patient has been restarted on a diet by general surgery Hernia may be repaired on an elective basis , follow up with Dr. Ninfa Linden     Hyponatremia, improving slowly - Suspect secondary to HCTZ, suspect dehydration in the setting of diarrhea from stool softeners  - Una and Uosm, TSH Within normal limits Improved  slowly, with  fluid restriction and holding HCTZ Cortisone 11.Doubt adrenal insufficiency   Hypertension-Continue Norvasc, Coreg, low-dose hydralazine,Cozaar, hold HCTZ ,       CKD (chronic kidney disease) stage 3, GFR 30-59 ml/min - Cr stable and WNL - monitor    Diabetes mellitus with diabetic nephropathy (HCC) Part to be hypoglycemic on his home insulin regimen therefore decreased to 8 units twice a day of  70/30     Anemia of chronic disease - no signs of bleeding Hemoglobin at 10 and stable Multiple gastric polyps seen in EGD 5/27 by Dr. Watt Climes. Biopsies obtained and were negative for malignancy and H pylori  Abdominal pain. Constipation No evidence of underlying malignancy, probable partial small bowel obstruction    Discharge Exam   Blood pressure 160/76, pulse 68, temperature 98.8 F (37.1 C), temperature source Oral, resp. rate 18, height '5\' 11"'$  (1.803 m), weight 91 kg (200 lb 9.9 oz), SpO2 100 %.   General: No acute respiratory distress Lungs: Clear to auscultation bilaterally without wheezes or crackles Cardiovascular: Regular rate and rhythm without murmur gallop or rub normal S1 and S2 Abdomen: Nontender, nondistended, soft, bowel sounds positive, no rebound, no ascites, no appreciable mass Extremities: No significant cyanosis, clubbing, or edema bilateral lower extremities    Follow-up Information    Follow up with [facility], MD. Go in 2 weeks.   Specialty:  General Surgery   Why:  For hospital follow up and to discuss hernia repair.  At 2:20 and come 30 minutes prior to appointment time to fill out new patient paperwork.      Contact information:   [address] [place] [address] [phone]       Follow up with Primary care provider. Schedule an appointment as soon as possible for a visit in 3 days.   Why:  Post hospital follow-up      Signed: Charidy Cappelletti 07/16/2015, 11:35 AM        Time spent >45 mins

## 2015-07-16 NOTE — Progress Notes (Signed)
Reviewed discharge paperwork with pt's son.  PIV removed.  Pt taken to discharge location via wheelchair.

## 2015-07-16 NOTE — Progress Notes (Signed)
Patient ID: Jose Kennedy, male   DOB: [birth year], 82 y.o.   MRN: [id number]     [facility]      [address] [place]   [place], [place] [address]    Phone: [phone] FAX: [phone]     Subjective: Tolerating POs.  No n/v or abdominal pain.   Objective:  Vital signs:  Filed Vitals:   07/15/15 2126 07/16/15 0405 07/16/15 0516 07/16/15 0825  BP: 145/58  128/82 160/76  Pulse: 57  73 68  Temp: 97.4 F (36.3 C)  98.8 F (37.1 C)   TempSrc: Oral  Oral   Resp: 18  18   Height:      Weight:  91 kg (200 lb 9.9 oz)    SpO2: 98%  100%     Last BM Date: 07/15/15  Intake/Output   Yesterday:  03/20 0701 - 03/21 0700 In: 700 [P.O.:700] Out: 3450 [Urine:3450] This shift:  Total I/O In: -  Out: 500 [Urine:500]  Physical Exam: General: Pt awake/alert/oriented x4 in no acute distress  Abdomen: Soft.  Nondistended.  Reducible ventral hernia.   No evidence of peritonitis.  No incarcerated hernias.   Problem List:   Principal Problem:   Acute confusional state of metabolic origin Active Problems:   CKD (chronic kidney disease) stage 3, GFR 30-59 ml/min   Hyponatremia   Diabetes mellitus with diabetic nephropathy (HCC)   Anemia of chronic disease   Acute confusion    Results:   Labs: Results for orders placed or performed during the hospital encounter of 07/13/15 (from the past 48 hour(s))  Glucose, capillary     Status: Abnormal   Collection Time: 07/14/15 11:55 AM  Result Value Ref Range   Glucose-Capillary 181 (H) 65 - 99 mg/dL  Magnesium     Status: None   Collection Time: 07/14/15 12:46 PM  Result Value Ref Range   Magnesium 1.9 1.7 - 2.4 mg/dL  Glucose, capillary     Status: Abnormal   Collection Time: 07/14/15  4:49 PM  Result Value Ref Range   Glucose-Capillary 173 (H) 65 - 99 mg/dL  Glucose, capillary     Status: None   Collection Time: 07/14/15  9:15 PM  Result Value Ref Range   Glucose-Capillary 88 65 - 99  mg/dL   Comment 1 Notify RN    Comment 2 Document in Chart   Comprehensive metabolic panel     Status: Abnormal   Collection Time: 07/15/15  7:34 AM  Result Value Ref Range   Sodium 125 (L) 135 - 145 mmol/L   Potassium 3.5 3.5 - 5.1 mmol/L   Chloride 88 (L) 101 - 111 mmol/L   CO2 29 22 - 32 mmol/L   Glucose, Bld 108 (H) 65 - 99 mg/dL   BUN 10 6 - 20 mg/dL   Creatinine, Ser 7.54 0.61 - 1.24 mg/dL   Calcium 8.2 (L) 8.9 - 10.3 mg/dL   Total Protein 5.5 (L) 6.5 - 8.1 g/dL   Albumin 2.9 (L) 3.5 - 5.0 g/dL   AST 50 (H) 15 - 41 U/L   ALT 23 17 - 63 U/L   Alkaline Phosphatase 43 38 - 126 U/L   Total Bilirubin 0.5 0.3 - 1.2 mg/dL   GFR calc non Af Amer >60 >60 mL/min   GFR calc Af Amer >60 >60 mL/min    Comment: (NOTE) The eGFR has been calculated using the CKD EPI equation. This calculation has not been validated in all  clinical situations. eGFR's persistently <60 mL/min signify possible Chronic Kidney Disease.    Anion gap 8 5 - 15  CBC     Status: Abnormal   Collection Time: 07/15/15  7:34 AM  Result Value Ref Range   WBC 6.2 4.0 - 10.5 K/uL   RBC 3.71 (L) 4.22 - 5.81 MIL/uL   Hemoglobin 10.1 (L) 13.0 - 17.0 g/dL   HCT 30.0 (L) 39.0 - 52.0 %   MCV 80.9 78.0 - 100.0 fL   MCH 27.2 26.0 - 34.0 pg   MCHC 33.7 30.0 - 36.0 g/dL   RDW 13.8 11.5 - 15.5 %   Platelets 256 150 - 400 K/uL  Glucose, capillary     Status: Abnormal   Collection Time: 07/15/15  7:55 AM  Result Value Ref Range   Glucose-Capillary 106 (H) 65 - 99 mg/dL  Glucose, capillary     Status: None   Collection Time: 07/15/15 12:11 PM  Result Value Ref Range   Glucose-Capillary 84 65 - 99 mg/dL  Glucose, capillary     Status: Abnormal   Collection Time: 07/15/15  5:11 PM  Result Value Ref Range   Glucose-Capillary 116 (H) 65 - 99 mg/dL  Glucose, capillary     Status: Abnormal   Collection Time: 07/15/15  9:15 PM  Result Value Ref Range   Glucose-Capillary 47 (L) 65 - 99 mg/dL   Comment 1 Notify RN    Comment  2 Document in Chart   Glucose, capillary     Status: None   Collection Time: 07/15/15 10:03 PM  Result Value Ref Range   Glucose-Capillary 95 65 - 99 mg/dL   Comment 1 Notify RN    Comment 2 Document in Chart   Basic metabolic panel     Status: Abnormal   Collection Time: 07/16/15  5:26 AM  Result Value Ref Range   Sodium 128 (L) 135 - 145 mmol/L   Potassium 3.4 (L) 3.5 - 5.1 mmol/L   Chloride 93 (L) 101 - 111 mmol/L   CO2 28 22 - 32 mmol/L   Glucose, Bld 133 (H) 65 - 99 mg/dL   BUN 9 6 - 20 mg/dL   Creatinine, Ser 0.88 0.61 - 1.24 mg/dL   Calcium 8.2 (L) 8.9 - 10.3 mg/dL   GFR calc non Af Amer >60 >60 mL/min   GFR calc Af Amer >60 >60 mL/min    Comment: (NOTE) The eGFR has been calculated using the CKD EPI equation. This calculation has not been validated in all clinical situations. eGFR's persistently <60 mL/min signify possible Chronic Kidney Disease.    Anion gap 7 5 - 15  Glucose, capillary     Status: Abnormal   Collection Time: 07/16/15  7:47 AM  Result Value Ref Range   Glucose-Capillary 111 (H) 65 - 99 mg/dL    Imaging / Studies: Dg Chest 2 View  07/15/2015  CLINICAL DATA:  82 year old male with cough and congestion for 4 days. Subsequent encounter. EXAM: CHEST  2 VIEW COMPARISON:  06/23/2015. FINDINGS: Dilated calcified tortuous aorta. Heart size top-normal to minimally enlarged. Central pulmonary vascular prominence and minimal peribronchial thickening without frank pulmonary edema. No segmental consolidation or pneumothorax. IMPRESSION: Central pulmonary vascular prominence and minimal peribronchial thickening without frank pulmonary edema. Dilated calcified tortuous aorta. Electronically Signed   By: Genia Del M.D.   On: 07/15/2015 07:51   Ct Abdomen Pelvis W Contrast  07/15/2015  ADDENDUM REPORT: 07/15/2015 07:53 ADDENDUM: First impression should read: Periumbilical bowel  containing hernia. There are 2 segments of small bowel entering this hernia. The superior  segment is dilated with abrupt change in caliber suggesting that the hernia is causing partial small bowel obstruction. These results will be called to the ordering clinician or representative by the Radiologist Assistant, and communication documented in the PACS or zVision Dashboard. Electronically Signed   By: Genia Del M.D.   On: 07/15/2015 07:53  07/15/2015  CLINICAL DATA:  82 year old diabetic hypertensive male with history of stomach ulcers presenting with nausea and vomiting. Initial encounter. EXAM: CT ABDOMEN AND PELVIS WITH CONTRAST TECHNIQUE: Multidetector CT imaging of the abdomen and pelvis was performed using the standard protocol following bolus administration of intravenous contrast. CONTRAST:  181m OMNIPAQUE IOHEXOL 300 MG/ML  SOLN COMPARISON:  No comparison CT. FINDINGS: Lower chest: Tiny calcified granulomas lung bases with areas of scarring without worrisome lesion noted. Hepatobiliary: No masses or other significant abnormality. Pancreas: No mass, inflammatory changes, or other significant abnormality. Spleen: Within normal limits in size and appearance. Adrenals/Urinary Tract: Multiple bilateral renal cysts measuring up to 7 cm. Distortion of nonobstructing collecting system. No adrenal abnormality. Stomach/Bowel: Under distended stomach with thickened folds and limited evaluation for the possibility of underlying ulcer. Hiatal hernia. Periumbilical small bowel containing hernia. There are 2 segments of small bowel entering this hernia with the superior segment dilated suggesting hernia this may be causing partial obstruction. History of prior appendectomy. No free air or free fluid. Fatty containing inguinal hernias. Vascular/Lymphatic: Prominent calcified plaque aorta and aortic branches. No high-grade stenosis. Ectasia without focal aneurysm. Calcified plaque iliac arteries with common iliac artery ectasia bilaterally. Narrowing origin of the celiac artery and superior mesenteric  artery. No adenopathy. Reproductive: Slightly heterogeneous lobulated prostate gland. Clinical and laboratory correlation recommended. Other: None. Musculoskeletal: Facet degenerative changes L4-5 and L5-S1. Grade 1 anterior slip L4. Bilateral foraminal narrowing and spinal stenosis. Bilateral hip joint degenerative changes. Fusion sacroiliac joints. No worrisome orbital osseous abnormality. IMPRESSION: Periumbilical small bowel containing hernia. There are 2 segments of small bowel entering this hernia with the superior segment dilated suggesting hernia this may be causing partial obstruction. Under distended stomach with thickened folds and limited evaluation for the possibility of underlying ulcer. Hiatal hernia. Multiple bilateral renal cysts measuring up to 7 cm. Distortion of nonobstructing collecting system. Atherosclerotic changes as detailed above. Slightly heterogeneous lobulated prostate gland. Clinical and laboratory correlation recommended. Recommended. Electronically Signed: By: SGenia DelM.D. On: 07/15/2015 07:47    Medications / Allergies:  Scheduled Meds: . amLODipine  10 mg Oral Daily  . atorvastatin  20 mg Oral q1800  . carvedilol  12.5 mg Oral BID WC  . docusate sodium  100 mg Oral BID  . enoxaparin (LOVENOX) injection  40 mg Subcutaneous Q24H  . ferrous sulfate  325 mg Oral BID  . hydrALAZINE  10 mg Oral TID  . insulin aspart  0-9 Units Subcutaneous TID WC  . insulin aspart protamine- aspart  10 Units Subcutaneous BID WC  . losartan  100 mg Oral Daily  . pantoprazole  40 mg Oral Daily  . potassium chloride  40 mEq Oral Once  . sucralfate  1 g Oral TID WC & HS   Continuous Infusions: . 0.9 % NaCl with KCl 40 mEq / L     PRN Meds:.ondansetron **OR** ondansetron (ZOFRAN) IV  Antibiotics: Anti-infectives    None        Assessment/Plan Incisional ventral hernia-reducible, no obstruction.  May discharge when medically stable.  Probably needs to have it repaired on  elective basis.  Discussed with the patient and his daughter Ivin Booty.  OP follow up with Dr. Ninfa Linden.  Please call for further assistance.    Erby Pian, [facility] Pager [phone]) For consults and floor pages call [phone](7A-[id number])  07/16/2015 10:17 AM

## 2015-07-16 NOTE — Care Management Important Message (Signed)
Important Message  Patient Details  Name: Jose Kennedy MRN: [id number] Date of Birth: [birth year]   Medicare Important Message Given:  Yes    [name] 07/16/2015, 12:03 PM

## 2015-07-16 NOTE — Evaluation (Signed)
Clinical/Bedside Swallow Evaluation Patient Details  Name: Jaymison Densmore MRN: [id number] Date of Birth: [birth year]  Today's Date: 07/16/2015 Time: SLP Start Time (ACUTE ONLY): [id number] SLP Stop Time (ACUTE ONLY): 1059 SLP Time Calculation (min) (ACUTE ONLY): 15 min  Past Medical History:  Past Medical History  Diagnosis Date  . Stroke ([place])   . Coronary artery disease   . Hypertension   . GERD (gastroesophageal reflux disease)   . History of stomach ulcers   . Heart murmur   . Diabetes mellitus     insulin dependent  . Laryngeal dystonia     chronic, appears as it he's dry heaving   Past Surgical History:  Past Surgical History  Procedure Laterality Date  . Laparoscopic cholecystectomy    . Back surgery    . Thyroid nodual    . Appendectomy    . Esophagogastroduodenoscopy  08/26/2011    Procedure: ESOPHAGOGASTRODUODENOSCOPY (EGD);  Surgeon: Wonda Horner, MD;  Location: [facility] ENDOSCOPY;  Service: Endoscopy;  Laterality: N/A;  . Esophagogastroduodenoscopy N/A 09/20/2013    Procedure: ESOPHAGOGASTRODUODENOSCOPY (EGD);  Surgeon: Jeryl Columbia, MD;  Location: [facility] ENDOSCOPY;  Service: Endoscopy;  Laterality: N/A;   HPI:  82 yo male with HTN, DM, esophageal dysmotility, and laryngeal dystonia who presented to [facility] ED with main concern of confusion. CXR without concerns for PNA. Pt had previously been evaluatedin 2015 by SLP with MBS showing good airway protection although wtih retrograde flow from the esophagus reaching the pharynx and oropharynx. Dys 3 diet and thin liquids were recommended at that time.   Assessment / Plan / Recommendation Clinical Impression  Pt's oropharyngeal swallow appears consistent with prior SLP evaluations, at which time pt had good airway protection during MBS but there was concern for the possibility of intermittent penetration/aspiration due to esophageal dysmotility. Today, oropharyngeal swallow occurs swiftly and with one instance of delayed cough after sips of water.  CXR is without concern for PNA. Recommend to adjust diet to Dys 3 textures and thin liquids as previously recommended. No acute SLP f/u indicated.    Aspiration Risk  Mild aspiration risk    Diet Recommendation Dysphagia 3 (Mech soft);Thin liquid   Liquid Administration via: Cup;Straw Medication Administration: Whole meds with liquid Supervision: Patient able to self feed;Intermittent supervision to cue for compensatory strategies Compensations: Slow rate;Small sips/bites;Follow solids with liquid Postural Changes: Seated upright at 90 degrees;Remain upright for at least 30 minutes after po intake    Other  Recommendations Oral Care Recommendations: Oral care BID   Follow up Recommendations  None    Frequency and Duration            Prognosis        Swallow Study   General HPI: 82 yo male with HTN, DM, esophageal dysmotility, and laryngeal dystonia who presented to [facility] ED with main concern of confusion. CXR without concerns for PNA. Pt had previously been evaluatedin 2015 by SLP with MBS showing good airway protection although wtih retrograde flow from the esophagus reaching the pharynx and oropharynx. Dys 3 diet and thin liquids were recommended at that time. Type of Study: Bedside Swallow Evaluation Previous Swallow Assessment: see HPI Diet Prior to this Study: Regular;Thin liquids Temperature Spikes Noted: No Respiratory Status: Room air History of Recent Intubation: No Behavior/Cognition: Alert;Cooperative;Pleasant mood Oral Cavity Assessment: Within Functional Limits Oral Care Completed by SLP: No Oral Cavity - Dentition: Dentures, top;Dentures, bottom Vision: Functional for self-feeding Self-Feeding Abilities: Able to feed self Patient Positioning: Upright in chair Baseline  Vocal Quality: Other (comment) (dysarthric, has laryngeal dystonia) Volitional Cough: Strong    Oral/Motor/Sensory Function     Ice Chips Ice chips: Not tested   Thin Liquid Thin Liquid:  Impaired Presentation: Cup;Self Fed;Straw Pharyngeal  Phase Impairments: Cough - Delayed (x1)    Nectar Thick Nectar Thick Liquid: Not tested   Honey Thick Honey Thick Liquid: Not tested   Puree Puree: Within functional limits Presentation: Self Fed;Spoon   Solid   GO   Solid: Within functional limits Presentation: Self Fed       Germain Osgood, M.A. CCC-SLP [phone]  Germain Osgood 07/16/2015,11:17 AM

## 2015-07-19 ENCOUNTER — Inpatient Hospital Stay (HOSPITAL_COMMUNITY)
Admission: EM | Admit: 2015-07-19 | Discharge: 2015-07-21 | DRG: 917 | Disposition: A | Discharge status: Home Health | Payer: Medicare Other | Attending: Family Medicine | Admitting: Family Medicine

## 2015-07-19 ENCOUNTER — Encounter (HOSPITAL_COMMUNITY): Payer: Self-pay | Admitting: *Deleted

## 2015-07-19 ENCOUNTER — Inpatient Hospital Stay (HOSPITAL_COMMUNITY): Payer: Medicare Other

## 2015-07-19 DIAGNOSIS — I2510 Atherosclerotic heart disease of native coronary artery without angina pectoris: Secondary | ICD-10-CM | POA: Diagnosis present

## 2015-07-19 DIAGNOSIS — J387 Other diseases of larynx: Secondary | ICD-10-CM | POA: Diagnosis present

## 2015-07-19 DIAGNOSIS — Z993 Dependence on wheelchair: Secondary | ICD-10-CM | POA: Diagnosis not present

## 2015-07-19 DIAGNOSIS — R600 Localized edema: Secondary | ICD-10-CM

## 2015-07-19 DIAGNOSIS — N183 Chronic kidney disease, stage 3 unspecified: Secondary | ICD-10-CM | POA: Diagnosis present

## 2015-07-19 DIAGNOSIS — Z794 Long term (current) use of insulin: Secondary | ICD-10-CM

## 2015-07-19 DIAGNOSIS — I5032 Chronic diastolic (congestive) heart failure: Secondary | ICD-10-CM | POA: Diagnosis present

## 2015-07-19 DIAGNOSIS — E0821 Diabetes mellitus due to underlying condition with diabetic nephropathy: Secondary | ICD-10-CM

## 2015-07-19 DIAGNOSIS — I130 Hypertensive heart and chronic kidney disease with heart failure and stage 1 through stage 4 chronic kidney disease, or unspecified chronic kidney disease: Secondary | ICD-10-CM | POA: Diagnosis present

## 2015-07-19 DIAGNOSIS — T502X1A Poisoning by carbonic-anhydrase inhibitors, benzothiadiazides and other diuretics, accidental (unintentional), initial encounter: Secondary | ICD-10-CM | POA: Diagnosis present

## 2015-07-19 DIAGNOSIS — Z87891 Personal history of nicotine dependence: Secondary | ICD-10-CM | POA: Diagnosis not present

## 2015-07-19 DIAGNOSIS — G9340 Encephalopathy, unspecified: Secondary | ICD-10-CM | POA: Diagnosis present

## 2015-07-19 DIAGNOSIS — K219 Gastro-esophageal reflux disease without esophagitis: Secondary | ICD-10-CM | POA: Diagnosis present

## 2015-07-19 DIAGNOSIS — K5900 Constipation, unspecified: Secondary | ICD-10-CM

## 2015-07-19 DIAGNOSIS — E1121 Type 2 diabetes mellitus with diabetic nephropathy: Secondary | ICD-10-CM | POA: Diagnosis present

## 2015-07-19 DIAGNOSIS — I25119 Atherosclerotic heart disease of native coronary artery with unspecified angina pectoris: Secondary | ICD-10-CM

## 2015-07-19 DIAGNOSIS — G249 Dystonia, unspecified: Secondary | ICD-10-CM | POA: Diagnosis present

## 2015-07-19 DIAGNOSIS — Z7982 Long term (current) use of aspirin: Secondary | ICD-10-CM | POA: Diagnosis not present

## 2015-07-19 DIAGNOSIS — D638 Anemia in other chronic diseases classified elsewhere: Secondary | ICD-10-CM

## 2015-07-19 DIAGNOSIS — Z8673 Personal history of transient ischemic attack (TIA), and cerebral infarction without residual deficits: Secondary | ICD-10-CM | POA: Diagnosis not present

## 2015-07-19 DIAGNOSIS — R410 Disorientation, unspecified: Secondary | ICD-10-CM

## 2015-07-19 DIAGNOSIS — Z79899 Other long term (current) drug therapy: Secondary | ICD-10-CM

## 2015-07-19 DIAGNOSIS — E871 Hypo-osmolality and hyponatremia: Secondary | ICD-10-CM | POA: Diagnosis present

## 2015-07-19 HISTORY — DX: Ulcer of esophagus without bleeding: K22.10

## 2015-07-19 HISTORY — DX: Type 2 diabetes mellitus without complications: E11.9

## 2015-07-19 HISTORY — DX: Dyskinesia of esophagus: K22.4

## 2015-07-19 HISTORY — DX: Pure hypercholesterolemia, unspecified: E78.00

## 2015-07-19 LAB — COMPREHENSIVE METABOLIC PANEL
ALK PHOS: 46 U/L (ref 38–126)
ALT: 24 U/L (ref 17–63)
ANION GAP: 10 (ref 5–15)
AST: 40 U/L (ref 15–41)
Albumin: 3.2 g/dL — ABNORMAL LOW (ref 3.5–5.0)
BILIRUBIN TOTAL: 0.3 mg/dL (ref 0.3–1.2)
BUN: 16 mg/dL (ref 6–20)
CALCIUM: 8.6 mg/dL — AB (ref 8.9–10.3)
CO2: 27 mmol/L (ref 22–32)
Chloride: 85 mmol/L — ABNORMAL LOW (ref 101–111)
Creatinine, Ser: 1.14 mg/dL (ref 0.61–1.24)
GFR calc non Af Amer: 57 mL/min — ABNORMAL LOW (ref >60)
Glucose, Bld: 224 mg/dL — ABNORMAL HIGH (ref 65–99)
Potassium: 3.6 mmol/L (ref 3.5–5.1)
SODIUM: 122 mmol/L — AB (ref 135–145)
TOTAL PROTEIN: 5.9 g/dL — AB (ref 6.5–8.1)

## 2015-07-19 LAB — URINALYSIS, ROUTINE W REFLEX MICROSCOPIC
Bilirubin Urine: NEGATIVE
Glucose, UA: NEGATIVE mg/dL
Hgb urine dipstick: NEGATIVE
Ketones, ur: NEGATIVE mg/dL
NITRITE: NEGATIVE
Protein, ur: NEGATIVE mg/dL
SPECIFIC GRAVITY, URINE: 1.011 (ref 1.005–1.030)
pH: 6.5 (ref 5.0–8.0)

## 2015-07-19 LAB — URINE MICROSCOPIC-ADD ON

## 2015-07-19 LAB — OSMOLALITY, URINE: Osmolality, Ur: 361 mOsm/kg (ref 300–900)

## 2015-07-19 LAB — CBC
HCT: 31.4 % — ABNORMAL LOW (ref 39.0–52.0)
HEMOGLOBIN: 10.2 g/dL — AB (ref 13.0–17.0)
MCH: 26.2 pg (ref 26.0–34.0)
MCHC: 32.5 g/dL (ref 30.0–36.0)
MCV: 80.5 fL (ref 78.0–100.0)
Platelets: 295 10*3/uL (ref 150–400)
RBC: 3.9 MIL/uL — AB (ref 4.22–5.81)
RDW: 13.5 % (ref 11.5–15.5)
WBC: 5.7 10*3/uL (ref 4.0–10.5)

## 2015-07-19 LAB — GLUCOSE, CAPILLARY: GLUCOSE-CAPILLARY: 149 mg/dL — AB (ref 65–99)

## 2015-07-19 LAB — CREATININE, URINE, RANDOM: CREATININE, URINE: 79.72 mg/dL

## 2015-07-19 LAB — SODIUM, URINE, RANDOM: SODIUM UR: 50 mmol/L

## 2015-07-19 MED ORDER — BISACODYL 10 MG RE SUPP: 10.0000 mg | Freq: Every day | RECTAL | Status: DC | PRN

## 2015-07-19 MED ORDER — ONDANSETRON HCL 4 MG PO TABS: 4.0000 mg | ORAL_TABLET | Freq: Four times a day (QID) | ORAL | Status: DC | PRN

## 2015-07-19 MED ORDER — AMLODIPINE BESYLATE 10 MG PO TABS
10.0000 mg | ORAL_TABLET | Freq: Every day | ORAL | Status: DC
  Administered 2015-07-20 – 2015-07-21 (×2): 10 mg via ORAL
  Filled 2015-07-19 (×2): qty 1

## 2015-07-19 MED ORDER — PANTOPRAZOLE SODIUM 40 MG PO TBEC
40.0000 mg | DELAYED_RELEASE_TABLET | Freq: Every day | ORAL | Status: DC
  Administered 2015-07-20 – 2015-07-21 (×2): 40 mg via ORAL
  Filled 2015-07-19 (×2): qty 1

## 2015-07-19 MED ORDER — ONDANSETRON HCL 4 MG/2ML IJ SOLN: 4.0000 mg | Freq: Four times a day (QID) | INTRAMUSCULAR | Status: DC | PRN

## 2015-07-19 MED ORDER — SUCRALFATE 1 G PO TABS
1.0000 g | ORAL_TABLET | Freq: Four times a day (QID) | ORAL | Status: DC
  Administered 2015-07-19 – 2015-07-21 (×6): 1 g via ORAL
  Filled 2015-07-19 (×6): qty 1

## 2015-07-19 MED ORDER — SODIUM CHLORIDE 0.9 % IV SOLN
INTRAVENOUS | Status: DC
  Administered 2015-07-19: 22:00:00 via INTRAVENOUS

## 2015-07-19 MED ORDER — INSULIN GLARGINE 100 UNIT/ML ~~LOC~~ SOLN
9.0000 [IU] | Freq: Every day | SUBCUTANEOUS | Status: DC
  Administered 2015-07-19 – 2015-07-20 (×2): 9 [IU] via SUBCUTANEOUS
  Filled 2015-07-19 (×3): qty 0.09

## 2015-07-19 MED ORDER — HYDRALAZINE HCL 10 MG PO TABS
10.0000 mg | ORAL_TABLET | Freq: Three times a day (TID) | ORAL | Status: DC
  Administered 2015-07-19 – 2015-07-21 (×6): 10 mg via ORAL
  Filled 2015-07-19 (×6): qty 1

## 2015-07-19 MED ORDER — SODIUM CHLORIDE 0.9% FLUSH
3.0000 mL | Freq: Two times a day (BID) | INTRAVENOUS | Status: DC
  Administered 2015-07-20 – 2015-07-21 (×2): 3 mL via INTRAVENOUS

## 2015-07-19 MED ORDER — ACETAMINOPHEN 650 MG RE SUPP: 650.0000 mg | Freq: Four times a day (QID) | RECTAL | Status: DC | PRN

## 2015-07-19 MED ORDER — ASPIRIN EC 81 MG PO TBEC
81.0000 mg | DELAYED_RELEASE_TABLET | Freq: Every day | ORAL | Status: DC
  Administered 2015-07-20 – 2015-07-21 (×2): 81 mg via ORAL
  Filled 2015-07-19 (×2): qty 1

## 2015-07-19 MED ORDER — LOSARTAN POTASSIUM 50 MG PO TABS
100.0000 mg | ORAL_TABLET | Freq: Every day | ORAL | Status: DC
  Administered 2015-07-20 – 2015-07-21 (×2): 100 mg via ORAL
  Filled 2015-07-19 (×2): qty 2

## 2015-07-19 MED ORDER — PRAVASTATIN SODIUM 40 MG PO TABS
40.0000 mg | ORAL_TABLET | Freq: Every day | ORAL | Status: DC
  Administered 2015-07-19 – 2015-07-20 (×2): 40 mg via ORAL
  Filled 2015-07-19 (×2): qty 1

## 2015-07-19 MED ORDER — ACETAMINOPHEN 325 MG PO TABS: 650.0000 mg | ORAL_TABLET | Freq: Four times a day (QID) | ORAL | Status: DC | PRN

## 2015-07-19 MED ORDER — POLYETHYLENE GLYCOL 3350 17 G PO PACK: 17.0000 g | PACK | Freq: Every day | ORAL | Status: DC | PRN

## 2015-07-19 MED ORDER — ENOXAPARIN SODIUM 40 MG/0.4ML ~~LOC~~ SOLN
40.0000 mg | SUBCUTANEOUS | Status: DC
  Administered 2015-07-19 – 2015-07-20 (×2): 40 mg via SUBCUTANEOUS
  Filled 2015-07-19 (×2): qty 0.4

## 2015-07-19 MED ORDER — FERROUS SULFATE 325 (65 FE) MG PO TABS
325.0000 mg | ORAL_TABLET | Freq: Every day | ORAL | Status: DC
  Administered 2015-07-20 – 2015-07-21 (×2): 325 mg via ORAL
  Filled 2015-07-19 (×2): qty 1

## 2015-07-19 MED ORDER — CARVEDILOL 12.5 MG PO TABS
12.5000 mg | ORAL_TABLET | Freq: Two times a day (BID) | ORAL | Status: DC
  Administered 2015-07-20 – 2015-07-21 (×3): 12.5 mg via ORAL
  Filled 2015-07-19 (×3): qty 1

## 2015-07-19 MED ORDER — SENNA 8.6 MG PO TABS
1.0000 | ORAL_TABLET | Freq: Two times a day (BID) | ORAL | Status: DC
  Administered 2015-07-19 – 2015-07-21 (×4): 8.6 mg via ORAL
  Filled 2015-07-19 (×4): qty 1

## 2015-07-19 MED ORDER — DOCUSATE SODIUM 100 MG PO CAPS
100.0000 mg | ORAL_CAPSULE | Freq: Two times a day (BID) | ORAL | Status: DC
  Administered 2015-07-19 – 2015-07-21 (×4): 100 mg via ORAL
  Filled 2015-07-19 (×4): qty 1

## 2015-07-19 MED ORDER — INSULIN ASPART 100 UNIT/ML ~~LOC~~ SOLN: 0.0000 [IU] | Freq: Every day | SUBCUTANEOUS | Status: DC

## 2015-07-19 MED ORDER — INSULIN ASPART 100 UNIT/ML ~~LOC~~ SOLN
0.0000 [IU] | Freq: Three times a day (TID) | SUBCUTANEOUS | Status: DC
  Administered 2015-07-20: 3 [IU] via SUBCUTANEOUS
  Administered 2015-07-20: 2 [IU] via SUBCUTANEOUS
  Administered 2015-07-21: 3 [IU] via SUBCUTANEOUS

## 2015-07-19 NOTE — ED Provider Notes (Signed)
CSN: [id number]     Arrival date & time 07/19/15  1759 History   First MD Initiated Contact with Patient 07/19/15 1848     Chief Complaint  Patient presents with  . Abnormal Lab     HPI Patient was brought in for altered mental status and hyponatremia. Recent admission for same. Had thought that may have been due to hydrochlorothiazide. Looks as if it was stopped but family says he is still taking it. Follow-up with primary care doctor yesterday and reportedly had sodium of 121 yesterday and was told to come to the ER. Did have an episode of confusion today. Patient is somewhat weaker than normal but still appropriate this time. No headache. No seizures.   Past Medical History  Diagnosis Date  . Stroke ([place])   . Coronary artery disease   . Hypertension   . GERD (gastroesophageal reflux disease)   . History of stomach ulcers   . Heart murmur   . Diabetes mellitus     insulin dependent  . Laryngeal dystonia     chronic, appears as it he's dry heaving   Past Surgical History  Procedure Laterality Date  . Laparoscopic cholecystectomy    . Back surgery    . Thyroid nodual    . Appendectomy    . Esophagogastroduodenoscopy  08/26/2011    Procedure: ESOPHAGOGASTRODUODENOSCOPY (EGD);  Surgeon: Wonda Horner, MD;  Location: [facility] ENDOSCOPY;  Service: Endoscopy;  Laterality: N/A;  . Esophagogastroduodenoscopy N/A 09/20/2013    Procedure: ESOPHAGOGASTRODUODENOSCOPY (EGD);  Surgeon: Jeryl Columbia, MD;  Location: [facility] ENDOSCOPY;  Service: Endoscopy;  Laterality: N/A;   History reviewed. No pertinent family history. Social History  Substance Use Topics  . Smoking status: Former Research scientist (life sciences)  . Smokeless tobacco: Never Used  . Alcohol Use: No    Review of Systems  Constitutional: Positive for fatigue.  Respiratory: Negative for cough and shortness of breath.   Cardiovascular: Negative for chest pain.  Gastrointestinal: Negative for abdominal pain.  Endocrine: Negative for polyphagia and polyuria.   Musculoskeletal: Negative for back pain.  Skin: Negative for wound.  Neurological: Positive for weakness.  Hematological: Negative for adenopathy.  Psychiatric/Behavioral: Negative for behavioral problems.      Allergies  Review of patient's allergies indicates no known allergies.  Home Medications   Prior to Admission medications   Medication Sig Start Date End Date Taking? Authorizing Provider  amLODipine (NORVASC) 10 MG tablet Take 10 mg by mouth daily.    Historical Provider, MD  aspirin EC 81 MG tablet Take 81 mg by mouth daily.    Historical Provider, MD  atorvastatin (LIPITOR) 20 MG tablet Take 1 tablet (20 mg total) by mouth daily at 6 PM. 07/16/15   Reyne Dumas, MD  carvedilol (COREG) 12.5 MG tablet Take 1 tablet (12.5 mg total) by mouth 2 (two) times daily with a meal. 07/30/13   Orson Eva, MD  docusate sodium (COLACE) 100 MG capsule Take 100 mg by mouth 2 (two) times daily.    Historical Provider, MD  ferrous sulfate 325 (65 FE) MG tablet Take 325 mg by mouth daily with breakfast.     Historical Provider, MD  hydrALAZINE (APRESOLINE) 10 MG tablet Take 10 mg by mouth 3 (three) times daily.    Historical Provider, MD  insulin NPH-insulin regular (NOVOLIN 70/30) (70-30) 100 UNIT/ML injection Inject 10 Units into the skin 2 (two) times daily with a meal.     Historical Provider, MD  losartan (COZAAR) 100 MG tablet Take  100 mg by mouth daily.    Historical Provider, MD  Omeprazole-Sodium Bicarbonate (ZEGERID) 20-1100 MG CAPS capsule Take 1 capsule by mouth daily before breakfast.    Historical Provider, MD  potassium chloride SA (K-DUR,KLOR-CON) 20 MEQ tablet Take 2 tablets (40 mEq total) by mouth once. 07/16/15   Reyne Dumas, MD  pravastatin (PRAVACHOL) 80 MG tablet Take 40 mg by mouth at bedtime.  04/26/15   Historical Provider, MD  sucralfate (CARAFATE) 1 G tablet Take 1 g by mouth 4 (four) times daily.    Historical Provider, MD   BP 145/86 mmHg  Pulse 74  Temp(Src) 99.3 F  (37.4 C) (Oral)  Resp 20  SpO2 97% Physical Exam  Constitutional: He appears well-developed.  HENT:  Head: Atraumatic.  Chronic voice changes.  Eyes: EOM are normal.  Neck: Neck supple.  Cardiovascular: Normal rate.   Pulmonary/Chest: Effort normal.  Abdominal: Soft. There is no tenderness. There is no rebound.  Reducible ventral hernia  Musculoskeletal: He exhibits no edema or tenderness.  Neurological: He is alert.  Skin: Skin is warm.    ED Course  Procedures (including critical care time) Labs Review Labs Reviewed  COMPREHENSIVE METABOLIC PANEL - Abnormal; Notable for the following:    Sodium 122 (*)    Chloride 85 (*)    Glucose, Bld 224 (*)    Calcium 8.6 (*)    Total Protein 5.9 (*)    Albumin 3.2 (*)    GFR calc non Af Amer 57 (*)    All other components within normal limits  CBC - Abnormal; Notable for the following:    RBC 3.90 (*)    Hemoglobin 10.2 (*)    HCT 31.4 (*)    All other components within normal limits    Imaging Review No results found. I have personally reviewed and evaluated these images and lab results as part of my medical decision-making.   EKG Interpretation None      MDM   Final diagnoses:  Hyponatremia  Acute confusion    Patient with hyponatremia. Had another episode of confusion today. Sent in by primary care doctor. Recent extensive workup for same. Will admit to internal medicine.    Davonna Belling, MD 07/19/15 [phone]

## 2015-07-19 NOTE — ED Notes (Signed)
Pts family reports he was here on Saturday for confusion and low sodium, was dc home from hospital on 3/18. Pt went to pcp to recheck labs yesterday. Was told to come back today due to low sodium. Pt reports fatigue and "not feeling well." denies any pain. Airway intact at triage.

## 2015-07-19 NOTE — ED Notes (Signed)
Patient transported to X-ray 

## 2015-07-19 NOTE — H&P (Signed)
PCP:  Pcp Not In System    Referring provider [place]   Chief Complaint:  confusion  HPI: Jose Kennedy is a 82 y.o. male   has a past medical history of Stroke ([place]); Coronary artery disease; Hypertension; GERD (gastroesophageal reflux disease); History of stomach ulcers; Heart murmur; Diabetes mellitus; and Laryngeal dystonia.   Presented with worsening confusion patient has been discharged on 21st of March has been admitted for the same As well as hyponatremia,  his hydrochlorothiazide has been stopped but family states that they have not dyscontinued the medication. During prior admission CXR was done no evidence of mass.  Patient was given IV fluids with improvement of his sodium At the time of discharge sodium was up to 128 he went to his primary care provider yesterday KUB was done showing large amount of stool was prescribed colace and Senokot and have had some bowel movement since but still reports feels like he is somewhat distended. H as drinking plenty of water consumed in the past >2L.  and was called to come back because his sodium is low again down to 121. Patient overall haven't been feeling well having had any fever or chills no nausea no vomiting no diarrhea. Had a short-term episode of confusion today  IN ER: [facility] 5.7 hemoglobin 10.2 sodium 122 chloride 85 bicarbonate 27 creatinine 1.14 glucose 224   Regarding pertinent past history: Patient has known history of diabetes and hypertension, past CVA and chronic diastolic CHF grade 1 with preserved EF per Echo 2015. Urine his prior admission CT scan of abdomen was done secondary to severe constipation which showed hernia causing possible partial small bowel obstruction patient has been seen in consult by surgery who felt there was no indication for operative intervention. Patient has chronic laryngeal dystonia which produces dry heaving. Dysphagia 3 diet in the past  Known history of chronic kidney disease but  creatinine is at time of discharge was down to 0.8 now back up to 1.14 Hospitalist was called for admission for hyponatremia and acute encephalopathy  Review of Systems:    Pertinent positives include:   Fatigue, constipation  Constitutional:  No weight loss, night sweats, Fevers, chills, weight loss  HEENT:  No headaches, Difficulty swallowing,Tooth/dental problems,Sore throat,  No sneezing, itching, ear ache, nasal congestion, post nasal drip,  Cardio-vascular:  No chest pain, Orthopnea, PND, anasarca, dizziness, palpitations.no Bilateral lower extremity swelling  GI:  No heartburn, indigestion, abdominal pain, nausea, vomiting, diarrhea, change in bowel habits, loss of appetite, melena, blood in stool, hematemesis Resp:  no shortness of breath at rest. No dyspnea on exertion, No excess mucus, no productive cough, No non-productive cough, No coughing up of blood.No change in color of mucus.No wheezing. Skin:  no rash or lesions. No jaundice GU:  no dysuria, change in color of urine, no urgency or frequency. No straining to urinate.  No flank pain.  Musculoskeletal:  No joint pain or no joint swelling. No decreased range of motion. No back pain.  Psych:  No change in mood or affect. No depression or anxiety. No memory loss.  Neuro: no localizing neurological complaints, no tingling, no weakness, no double vision, no gait abnormality, no slurred speech, no confusion  Otherwise ROS are negative except for above, 10 systems were reviewed  Past Medical History: Past Medical History  Diagnosis Date  . Stroke ([place])   . Coronary artery disease   . Hypertension   . GERD (gastroesophageal reflux disease)   . History of  stomach ulcers   . Heart murmur   . Diabetes mellitus     insulin dependent  . Laryngeal dystonia     chronic, appears as it he's dry heaving   Past Surgical History  Procedure Laterality Date  . Laparoscopic cholecystectomy    . Back surgery    . Thyroid  nodual    . Appendectomy    . Esophagogastroduodenoscopy  08/26/2011    Procedure: ESOPHAGOGASTRODUODENOSCOPY (EGD);  Surgeon: Wonda Horner, MD;  Location: [facility] ENDOSCOPY;  Service: Endoscopy;  Laterality: N/A;  . Esophagogastroduodenoscopy N/A 09/20/2013    Procedure: ESOPHAGOGASTRODUODENOSCOPY (EGD);  Surgeon: Jeryl Columbia, MD;  Location: [facility] ENDOSCOPY;  Service: Endoscopy;  Laterality: N/A;     Medications: Prior to Admission medications   Medication Sig Start Date End Date Taking? Authorizing Provider  amLODipine (NORVASC) 10 MG tablet Take 10 mg by mouth daily.    Historical Provider, MD  aspirin EC 81 MG tablet Take 81 mg by mouth daily.    Historical Provider, MD  atorvastatin (LIPITOR) 20 MG tablet Take 1 tablet (20 mg total) by mouth daily at 6 PM. 07/16/15   Reyne Dumas, MD  carvedilol (COREG) 12.5 MG tablet Take 1 tablet (12.5 mg total) by mouth 2 (two) times daily with a meal. 07/30/13   Orson Eva, MD  docusate sodium (COLACE) 100 MG capsule Take 100 mg by mouth 2 (two) times daily.    Historical Provider, MD  ferrous sulfate 325 (65 FE) MG tablet Take 325 mg by mouth daily with breakfast.     Historical Provider, MD  hydrALAZINE (APRESOLINE) 10 MG tablet Take 10 mg by mouth 3 (three) times daily.    Historical Provider, MD  insulin NPH-insulin regular (NOVOLIN 70/30) (70-30) 100 UNIT/ML injection Inject 10 Units into the skin 2 (two) times daily with a meal.     Historical Provider, MD  losartan (COZAAR) 100 MG tablet Take 100 mg by mouth daily.    Historical Provider, MD  Omeprazole-Sodium Bicarbonate (ZEGERID) 20-1100 MG CAPS capsule Take 1 capsule by mouth daily before breakfast.    Historical Provider, MD  potassium chloride SA (K-DUR,KLOR-CON) 20 MEQ tablet Take 2 tablets (40 mEq total) by mouth once. 07/16/15   Reyne Dumas, MD  pravastatin (PRAVACHOL) 80 MG tablet Take 40 mg by mouth at bedtime.  04/26/15   Historical Provider, MD  sucralfate (CARAFATE) 1 G tablet Take 1 g by  mouth 4 (four) times daily.    Historical Provider, MD    Allergies:  No Known Allergies  Social History:  Ambulatory  wheelchair bound Lives at home  With family     reports that he has quit smoking. He has never used smokeless tobacco. He reports that he does not drink alcohol or use illicit drugs.     Family History: family history includes Diabetes type II in his sister; Hypertension in his brother, father, mother, and sister; Stroke in his father and mother. There is no history of Cancer or CAD.    Physical Exam: Patient Vitals for the past 24 hrs:  BP Temp Temp src Pulse Resp SpO2  07/19/15 1932 133/70 mmHg - - 71 19 98 %  07/19/15 1811 145/86 mmHg 99.3 F (37.4 C) Oral 74 20 97 %    1. General:  in No Acute distress 2. Psychological: Alert and   Oriented to self place situation 3. Head/ENT:     Dry Mucous Membranes  Head Non traumatic, neck supple                            Poor Dentition 4. SKIN:  decreased Skin turgor,  Skin clean Dry and intact no rash 5. Heart: Regular rate and rhythm no  Murmur, Rub or gallop 6. Lungs:  Clear to auscultation bilaterally, no wheezes or crackles   7. Abdomen: Soft, non-tender,distended 8. Lower extremities: no clubbing, cyanosis,  edema right worse than left 9. Neurologically Grossly intact, moving all 4 extremities equally 10. MSK: Normal range of motion  body mass index is unknown because there is no weight on file.   Labs on Admission:   Results for orders placed or performed during the hospital encounter of 07/19/15 (from the past 24 hour(s))  Comprehensive metabolic panel     Status: Abnormal   Collection Time: 07/19/15  6:14 PM  Result Value Ref Range   Sodium 122 (L) 135 - 145 mmol/L   Potassium 3.6 3.5 - 5.1 mmol/L   Chloride 85 (L) 101 - 111 mmol/L   CO2 27 22 - 32 mmol/L   Glucose, Bld 224 (H) 65 - 99 mg/dL   BUN 16 6 - 20 mg/dL   Creatinine, Ser 1.14 0.61 - 1.24 mg/dL   Calcium 8.6  (L) 8.9 - 10.3 mg/dL   Total Protein 5.9 (L) 6.5 - 8.1 g/dL   Albumin 3.2 (L) 3.5 - 5.0 g/dL   AST 40 15 - 41 U/L   ALT 24 17 - 63 U/L   Alkaline Phosphatase 46 38 - 126 U/L   Total Bilirubin 0.3 0.3 - 1.2 mg/dL   GFR calc non Af Amer 57 (L) >60 mL/min   GFR calc Af Amer >60 >60 mL/min   Anion gap 10 5 - 15  CBC     Status: Abnormal   Collection Time: 07/19/15  6:14 PM  Result Value Ref Range   WBC 5.7 4.0 - 10.5 K/uL   RBC 3.90 (L) 4.22 - 5.81 MIL/uL   Hemoglobin 10.2 (L) 13.0 - 17.0 g/dL   HCT 31.4 (L) 39.0 - 52.0 %   MCV 80.5 78.0 - 100.0 fL   MCH 26.2 26.0 - 34.0 pg   MCHC 32.5 30.0 - 36.0 g/dL   RDW 13.5 11.5 - 15.5 %   Platelets 295 150 - 400 K/uL    UA   ordered  Lab Results  Component Value Date   HGBA1C 6.4* 07/30/2013    Estimated Creatinine Clearance: 54.7 mL/min (by C-G formula based on Cr of 1.14).  BNP (last 3 results) No results for input(s): PROBNP in the last 8760 hours.  Other results:  I have pearsonaly reviewed this: ECG REPORT Not obtained   There were no vitals filed for this visit.   Cultures:    Component Value Date/Time   SDES URINE, RANDOM 07/13/2015 1905   SPECREQUEST NONE 07/13/2015 1905   CULT MULTIPLE SPECIES PRESENT, SUGGEST RECOLLECTION 07/13/2015 1905   REPTSTATUS 07/15/2015 FINAL 07/13/2015 1905     Radiological Exams on Admission: No results found.  Chart has been reviewed  Family   at  Bedside    Assessment/Plan  82 year old gentleman with history of diabetes, chronic kidney disease, coronary artery disease, presents with recurrent hyponatremia in the setting of noncompliance with discharge instructions presented with acute encephalopathy which is mild and currently resolving  Present on Admission:  . Hyponatremia - we will discontinue hydrochlorothiazide. In the  past to have improved after administration of IV fluids will attempt the same approach. We'll check TSH and cortisol level and urine electrolytes  .  Diabetes mellitus with diabetic nephropathy ([place]) -while hospitalized change to Lantus 9 units order sliding scale  . Coronary artery disease stable continue home medications including statin aspirin and Coreg  . CKD (chronic kidney disease) stage 3, GFR 30-59 ml/min stable baseline creatinine has been within normal limits  . Anemia of chronic disease - stable continue to monitor . Acute encephalopathy in the setting of hyponatremia currently resolved  . Chronic diastolic CHF (congestive heart failure) (HCC) currently does not appear to be significantly fluid overloaded we'll continue to monitor carefully avoid fluid overload if evidence of such would add lasix  Isolated with lower extremity edema will obtain Dopplers    Prophylaxis:  Lovenox   CODE STATUS:  FULL CODE as per patient    Disposition:    To home once workup is complete and patient is stable will likely need home health  Other plan as per orders.  I have spent a total of 57 min on this admission    Deklynn Charlet 07/19/2015, 9:21 PM    [facility]  Pager [phone]   after 2 AM please page floor coverage PA If 7AM-7PM, please contact the day team taking care of the patient  [email or web address]  Password [email or web address]

## 2015-07-19 NOTE — Progress Notes (Signed)
Received report from Freida Busman, [place] in ED for transfer of pt to [phone]

## 2015-07-20 ENCOUNTER — Inpatient Hospital Stay (HOSPITAL_COMMUNITY): Payer: Medicare Other

## 2015-07-20 DIAGNOSIS — R600 Localized edema: Secondary | ICD-10-CM

## 2015-07-20 DIAGNOSIS — E871 Hypo-osmolality and hyponatremia: Secondary | ICD-10-CM

## 2015-07-20 DIAGNOSIS — I5032 Chronic diastolic (congestive) heart failure: Secondary | ICD-10-CM

## 2015-07-20 DIAGNOSIS — N183 Chronic kidney disease, stage 3 (moderate): Secondary | ICD-10-CM

## 2015-07-20 DIAGNOSIS — G9340 Encephalopathy, unspecified: Secondary | ICD-10-CM

## 2015-07-20 LAB — GLUCOSE, CAPILLARY
GLUCOSE-CAPILLARY: 118 mg/dL — AB (ref 65–99)
GLUCOSE-CAPILLARY: 150 mg/dL — AB (ref 65–99)
Glucose-Capillary: 164 mg/dL — ABNORMAL HIGH (ref 65–99)
Glucose-Capillary: 189 mg/dL — ABNORMAL HIGH (ref 65–99)

## 2015-07-20 LAB — BASIC METABOLIC PANEL
ANION GAP: 8 (ref 5–15)
Anion gap: 8 (ref 5–15)
Anion gap: 7 (ref 5–15)
Anion gap: 7 (ref 5–15)
BUN: 11 mg/dL (ref 6–20)
BUN: 11 mg/dL (ref 6–20)
BUN: 11 mg/dL (ref 6–20)
BUN: 12 mg/dL (ref 6–20)
CALCIUM: 8.6 mg/dL — AB (ref 8.9–10.3)
CALCIUM: 8.5 mg/dL — AB (ref 8.9–10.3)
CALCIUM: 8.7 mg/dL — AB (ref 8.9–10.3)
CALCIUM: 8.5 mg/dL — AB (ref 8.9–10.3)
CO2: 30 mmol/L (ref 22–32)
CO2: 31 mmol/L (ref 22–32)
CO2: 30 mmol/L (ref 22–32)
CO2: 27 mmol/L (ref 22–32)
CREATININE: 0.93 mg/dL (ref 0.61–1.24)
CREATININE: 0.9 mg/dL (ref 0.61–1.24)
Chloride: 92 mmol/L — ABNORMAL LOW (ref 101–111)
Chloride: 92 mmol/L — ABNORMAL LOW (ref 101–111)
Chloride: 93 mmol/L — ABNORMAL LOW (ref 101–111)
Chloride: 96 mmol/L — ABNORMAL LOW (ref 101–111)
Creatinine, Ser: 1.03 mg/dL (ref 0.61–1.24)
Creatinine, Ser: 1.02 mg/dL (ref 0.61–1.24)
GFR calc Af Amer: >60 mL/min (ref >60)
GFR calc Af Amer: >60 mL/min (ref >60)
GFR calc Af Amer: >60 mL/min (ref >60)
GFR calc Af Amer: >60 mL/min (ref >60)
GLUCOSE: 185 mg/dL — AB (ref 65–99)
GLUCOSE: 151 mg/dL — AB (ref 65–99)
GLUCOSE: 162 mg/dL — AB (ref 65–99)
GLUCOSE: 204 mg/dL — AB (ref 65–99)
Potassium: 3.6 mmol/L (ref 3.5–5.1)
Potassium: 3.4 mmol/L — ABNORMAL LOW (ref 3.5–5.1)
Potassium: 3.6 mmol/L (ref 3.5–5.1)
Potassium: 3.6 mmol/L (ref 3.5–5.1)
Sodium: 130 mmol/L — ABNORMAL LOW (ref 135–145)
Sodium: 131 mmol/L — ABNORMAL LOW (ref 135–145)
Sodium: 130 mmol/L — ABNORMAL LOW (ref 135–145)
Sodium: 130 mmol/L — ABNORMAL LOW (ref 135–145)

## 2015-07-20 LAB — COMPREHENSIVE METABOLIC PANEL
ALT: 25 U/L (ref 17–63)
AST: 35 U/L (ref 15–41)
Albumin: 2.8 g/dL — ABNORMAL LOW (ref 3.5–5.0)
Alkaline Phosphatase: 42 U/L (ref 38–126)
Anion gap: 8 (ref 5–15)
BILIRUBIN TOTAL: 0.6 mg/dL (ref 0.3–1.2)
BUN: 13 mg/dL (ref 6–20)
CO2: 30 mmol/L (ref 22–32)
Calcium: 8.4 mg/dL — ABNORMAL LOW (ref 8.9–10.3)
Chloride: 90 mmol/L — ABNORMAL LOW (ref 101–111)
Creatinine, Ser: 0.98 mg/dL (ref 0.61–1.24)
GFR calc Af Amer: >60 mL/min (ref >60)
Glucose, Bld: 134 mg/dL — ABNORMAL HIGH (ref 65–99)
POTASSIUM: 3.1 mmol/L — AB (ref 3.5–5.1)
Sodium: 128 mmol/L — ABNORMAL LOW (ref 135–145)
TOTAL PROTEIN: 5.2 g/dL — AB (ref 6.5–8.1)

## 2015-07-20 LAB — CBC
HEMATOCRIT: 29.5 % — AB (ref 39.0–52.0)
Hemoglobin: 9.9 g/dL — ABNORMAL LOW (ref 13.0–17.0)
MCH: 27.2 pg (ref 26.0–34.0)
MCHC: 33.6 g/dL (ref 30.0–36.0)
MCV: 81 fL (ref 78.0–100.0)
PLATELETS: 283 10*3/uL (ref 150–400)
RBC: 3.64 MIL/uL — ABNORMAL LOW (ref 4.22–5.81)
RDW: 13.8 % (ref 11.5–15.5)
WBC: 5.9 10*3/uL (ref 4.0–10.5)

## 2015-07-20 LAB — MAGNESIUM: Magnesium: 1.8 mg/dL (ref 1.7–2.4)

## 2015-07-20 LAB — PHOSPHORUS: PHOSPHORUS: 3.3 mg/dL (ref 2.5–4.6)

## 2015-07-20 LAB — CORTISOL-AM, BLOOD: Cortisol - AM: 8.4 ug/dL (ref 6.7–22.6)

## 2015-07-20 LAB — TSH: TSH: 0.809 u[IU]/mL (ref 0.350–4.500)

## 2015-07-20 MED ORDER — POTASSIUM CHLORIDE CRYS ER 20 MEQ PO TBCR
40.0000 meq | EXTENDED_RELEASE_TABLET | Freq: Once | ORAL | Status: AC
  Administered 2015-07-20: 40 meq via ORAL
  Filled 2015-07-20: qty 2

## 2015-07-20 NOTE — Progress Notes (Signed)
[facility] PROGRESS NOTE  Jose Kennedy [id number] DOB: 13-Apr-1928 DOA: 07/19/2015 PCP: Pcp Not In System  Assessment/Plan:   Acute encephalopathy - Secondary to low sodium, improving after discontinuation of hctz and IVF rehydration - will plan on discharging off of hctz. - improving with improvement of sodium levels  Hyponatremia - Most likely cause of principal problem. -Monitor BMPs every 4 hours - Patient had improvement of 8 mEq in 24 hour. As such will discontinue IV fluid rehydration  Active Problems:   Coronary artery disease - stable no chest pain reported, continue statin and aspirin    CKD (chronic kidney disease) stage 3, GFR 30-59 ml/min - Serum creatinine stable and improved with gentle fluid rehydration    Diabetes mellitus with diabetic nephropathy (HCC) -Stable continue supportive therapy    Anemia of chronic disease - Stable no active bleeding, suspect mild decreased level secondary to IV fluid rehydration    Chronic diastolic CHF (congestive heart failure) (HCC) - Currently compensated continue carvedilol, ARB, hydralazine  Code Status: Full Family Communication: *Discussed with son at bedside Disposition Plan: Pending continued improvement in condition   Consultants:  None  Procedures:  None  Antibiotics:  None  HPI/Subjective: Patient has no new complaints. No acute issues overnight  Objective: Filed Vitals:   07/20/15 0524 07/20/15 1026  BP: 124/66   Pulse: 71   Temp: 98.2 F (36.8 C) 97.5 F (36.4 C)  Resp: 18 18    Intake/Output Summary (Last 24 hours) at 07/20/15 1243 Last data filed at 07/20/15 [id number]  Gross per 24 hour  Intake    820 ml  Output   2350 ml  Net  -1530 ml   There were no vitals filed for this visit.  Exam:   General:  Pt in nad, alert and awake  Cardiovascular: rrr, no mrg  Respiratory: cta bl, no wheezes  Abdomen: soft, nd, nt  Musculoskeletal: no cyanosis or clubbing   Data  Reviewed: Basic Metabolic Panel:  Recent Labs Lab 07/14/15 1246 07/15/15 0734 07/16/15 0526 07/19/15 1814 07/20/15 0443 07/20/15 0954  NA  --  125* 128* 122* 128* 130*  K  --  3.5 3.4* 3.6 3.1* 3.6  CL  --  88* 93* 85* 90* 92*  CO2  --  29 28 27 30 30   GLUCOSE  --  108* 133* 224* 134* 185*  BUN  --  10 9 16 13 11   CREATININE  --  0.98 0.88 1.14 0.98 0.93  CALCIUM  --  8.2* 8.2* 8.6* 8.4* 8.6*  MG 1.9  --   --   --  1.8  --   PHOS  --   --   --   --  3.3  --    Liver Function Tests:  Recent Labs Lab 07/13/15 1417 07/15/15 0734 07/19/15 1814 07/20/15 0443  AST 35 50* 40 35  ALT 23 23 24 25   ALKPHOS 48 43 46 42  BILITOT 0.5 0.5 0.3 0.6  PROT 6.2* 5.5* 5.9* 5.2*  ALBUMIN 3.2* 2.9* 3.2* 2.8*   No results for input(s): LIPASE, AMYLASE in the last 168 hours. No results for input(s): AMMONIA in the last 168 hours. CBC:  Recent Labs Lab 07/13/15 1417 07/14/15 0548 07/15/15 0734 07/19/15 1814 07/20/15 0443  WBC 5.7 6.0 6.2 5.7 5.9  HGB 9.5* 9.2* 10.1* 10.2* 9.9*  HCT 29.2* 28.1* 30.0* 31.4* 29.5*  MCV 81.6 81.0 80.9 80.5 81.0  PLT 224 232 256 295 283   Cardiac Enzymes: No  results for input(s): CKTOTAL, CKMB, CKMBINDEX, TROPONINI in the last 168 hours. BNP (last 3 results) No results for input(s): BNP in the last 8760 hours.  ProBNP (last 3 results) No results for input(s): PROBNP in the last 8760 hours.  CBG:  Recent Labs Lab 07/16/15 0747 07/16/15 1136 07/19/15 2226 07/20/15 0805 07/20/15 1225  GLUCAP 111* 215* 149* 118* 164*    Recent Results (from the past 240 hour(s))  Urine culture     Status: None   Collection Time: 07/13/15  7:05 PM  Result Value Ref Range Status   Specimen Description URINE, RANDOM  Final   Special Requests NONE  Final   Culture MULTIPLE SPECIES PRESENT, SUGGEST RECOLLECTION  Final   Report Status 07/15/2015 FINAL  Final     Studies: Dg Abd 1 View  07/19/2015  CLINICAL DATA:  82 year old male with constipation and  weakness. EXAM: ABDOMEN - 1 VIEW COMPARISON:  CT dated 07/15/2015 FINDINGS: Large amount of S at the dense stool noted throughout the colon and rectosigmoid compatible with constipation. His there is no evidence of bowel obstruction or free air. No radiopaque calculi identified. Right upper quadrant cholecystectomy clips noted. There is degenerative changes of the spine. No acute osseous pathology identified. IMPRESSION: Constipation.  No bowel obstruction. Electronically Signed   By: Anner Crete M.D.   On: 07/19/2015 21:28    Scheduled Meds: . amLODipine  10 mg Oral Daily  . aspirin EC  81 mg Oral Daily  . carvedilol  12.5 mg Oral BID WC  . docusate sodium  100 mg Oral BID  . enoxaparin (LOVENOX) injection  40 mg Subcutaneous Q24H  . ferrous sulfate  325 mg Oral Q breakfast  . hydrALAZINE  10 mg Oral TID  . insulin aspart  0-15 Units Subcutaneous TID WC  . insulin aspart  0-5 Units Subcutaneous QHS  . insulin glargine  9 Units Subcutaneous QHS  . losartan  100 mg Oral Daily  . pantoprazole  40 mg Oral Daily  . pravastatin  40 mg Oral QHS  . senna  1 tablet Oral BID  . sodium chloride flush  3 mL Intravenous Q12H  . sucralfate  1 g Oral QID   Continuous Infusions: . sodium chloride 75 mL/hr at 07/19/15 2217    Time spent: > 35 minutes   Velvet Bathe  [facility] Pager [phone]  If 7PM-7AM, please contact night-coverage at [email or web address], password [facility] 07/20/2015, 12:43 PM  LOS: 1 day

## 2015-07-20 NOTE — Evaluation (Signed)
Physical Therapy Evaluation Patient Details Name: Jose Kennedy MRN: [id number] DOB: 19-Mar-1928 Today's Date: 07/20/2015   History of Present Illness  82 yo male with Stroke, CAD, HTN and DM along with laryngeal dystonia has PT ordered after acute encephalopathy.  Sodium continues to decline, persistent confusion.  Clinical Impression  Pt is getting up to walk with PT and has some difficulty with slower speeds, with wide slow turns and with safety to stand or get in/OOB.  He is responding appropriately but slowly, but is motivated to try to move with PT.  Will focus on strengthening and balance with mobility to increase his independence, very supportive family.    Follow Up Recommendations Home health PT;Supervision for mobility/OOB (will need SNF if family cannot commit to this)    Equipment Recommendations  None recommended by PT    Recommendations for Other Services Rehab consult     Precautions / Restrictions Precautions Precautions: Fall Restrictions Weight Bearing Restrictions: No      Mobility  Bed Mobility Overal bed mobility: Needs Assistance Bed Mobility: Supine to Sit;Sit to Supine     Supine to sit: Min assist;Mod assist Sit to supine: Min assist;Mod assist   General bed mobility comments: HOB elevated with bedrail to get OOB, level to get in  Transfers Overall transfer level: Needs assistance Equipment used: Rolling Dendinger (2 wheeled);1 person hand held assist Transfers: Sit to/from [facility] Sit to Stand: Min assist Stand pivot transfers: Min guard;Min assist       General transfer comment: verbal cues to sequence and maintain safety  Ambulation/Gait Ambulation/Gait assistance: Min guard Ambulation Distance (Feet): 175 Feet Assistive device: Rolling Biby (2 wheeled);1 person hand held assist Gait Pattern/deviations: Step-through pattern;Step-to pattern;Narrow base of support;Drifts right/left (wide slow turns) Gait velocity: reduced  esp turns Gait velocity interpretation: Below normal speed for age/gender General Gait Details: using RW effectively to walk  Stairs            Wheelchair Mobility    Modified Rankin (Stroke Patients Only)       Balance Overall balance assessment: Needs assistance Sitting-balance support: Feet supported Sitting balance-Leahy Scale: Good   Postural control: Posterior lean Standing balance support: Bilateral upper extremity supported Standing balance-Leahy Scale: Fair Standing balance comment: fair- gait balance                             Pertinent Vitals/Pain Pain Assessment: No/denies pain    Home Living Family/patient expects to be discharged to:: Private residence Living Arrangements: Children;Other relatives Available Help at Discharge: Family;Available PRN/intermittently Type of Home: House Home Access: Stairs to enter Entrance Stairs-Rails: Right Entrance Stairs-Number of Steps: 3 Home Layout: One level;Laundry or work area in [place]: Environmental consultant - 2 wheels;[place] - 4 wheels;Cane - single point      Prior Function Level of Independence: Independent with assistive device(s)         Comments: has gradually decreased his activity per his daughter, sits more     Hand Dominance        Extremity/Trunk Assessment   Upper Extremity Assessment: Overall WFL for tasks assessed           Lower Extremity Assessment: Generalized weakness      Cervical / Trunk Assessment: Normal  Communication   Communication: Expressive difficulties (laryngeal dystonia but understandable)  Cognition Arousal/Alertness: Awake/alert Behavior During Therapy: WFL for tasks assessed/performed Overall Cognitive Status: Within Functional Limits for tasks assessed  Memory: Decreased short-term memory (but able to mobilize with instructions)              General Comments General comments (skin integrity, edema, etc.): Pt is moving well  but needs help with LE's and to plan his pathway    Exercises        Assessment/Plan    PT Assessment Patient needs continued PT services  PT Diagnosis Difficulty walking   PT Problem List Decreased strength;Decreased range of motion;Decreased activity tolerance;Decreased balance;Decreased mobility;Decreased coordination;Decreased knowledge of use of DME;Decreased cognition;Decreased safety awareness;Cardiopulmonary status limiting activity;Obesity  PT Treatment Interventions Gait training;DME instruction;Stair training;Functional mobility training;Therapeutic activities;Therapeutic exercise;Balance training;Neuromuscular re-education;Cognitive remediation;Patient/family education   PT Goals (Current goals can be found in the Care Plan section) Acute Rehab PT Goals Patient Stated Goal: to walk and get home PT Goal Formulation: With patient/family Time For Goal Achievement: 08/03/15 Potential to Achieve Goals: Good    Frequency Min 3X/week   Barriers to discharge Decreased caregiver support will be home alone at times    Co-evaluation               End of Session Equipment Utilized During Treatment: Gait belt Activity Tolerance: Patient tolerated treatment well;Patient limited by fatigue;Other (comment) (cognition) Patient left: in bed;with call bell/phone within reach;with bed alarm set;with family/visitor present Nurse Communication: Mobility status         Time: [id number] PT Time Calculation (min) (ACUTE ONLY): 28 min   Charges:   PT Evaluation $PT Eval Low Complexity: 1 Procedure PT Treatments $Gait Training: 8-22 mins   PT G Codes:        Ramond Dial 08-11-2015, 11:11 AM  Mee Hives, PT MS Acute Rehab Dept. Number: [facility] [phone] and [place] [phone]

## 2015-07-20 NOTE — Progress Notes (Signed)
VASCULAR LAB PRELIMINARY  PRELIMINARY  PRELIMINARY  PRELIMINARY  Bilateral lower extremity venous duplex completed.    Preliminary report:  There is no DVT or SVT noted in the bilateral lower extremities.    Hanaa Payes, RVT 07/20/2015, 4:30 PM

## 2015-07-21 LAB — BASIC METABOLIC PANEL
ANION GAP: 7 (ref 5–15)
ANION GAP: 8 (ref 5–15)
BUN: 11 mg/dL (ref 6–20)
BUN: 10 mg/dL (ref 6–20)
CALCIUM: 8.7 mg/dL — AB (ref 8.9–10.3)
CO2: 30 mmol/L (ref 22–32)
CO2: 30 mmol/L (ref 22–32)
Calcium: 8.5 mg/dL — ABNORMAL LOW (ref 8.9–10.3)
Chloride: 95 mmol/L — ABNORMAL LOW (ref 101–111)
Chloride: 96 mmol/L — ABNORMAL LOW (ref 101–111)
Creatinine, Ser: 0.95 mg/dL (ref 0.61–1.24)
Creatinine, Ser: 0.96 mg/dL (ref 0.61–1.24)
GLUCOSE: 140 mg/dL — AB (ref 65–99)
GLUCOSE: 113 mg/dL — AB (ref 65–99)
POTASSIUM: 3.4 mmol/L — AB (ref 3.5–5.1)
POTASSIUM: 3.6 mmol/L (ref 3.5–5.1)
Sodium: 132 mmol/L — ABNORMAL LOW (ref 135–145)
Sodium: 134 mmol/L — ABNORMAL LOW (ref 135–145)

## 2015-07-21 LAB — GLUCOSE, CAPILLARY
GLUCOSE-CAPILLARY: 106 mg/dL — AB (ref 65–99)
Glucose-Capillary: 175 mg/dL — ABNORMAL HIGH (ref 65–99)

## 2015-07-21 NOTE — Progress Notes (Signed)
Occupational Therapy Evaluation Patient Details Name: Jose Kennedy MRN: [id number] DOB: 1927-11-23 Today's Date: 07/21/2015    History of Present Illness 82 yo male with Stroke, CAD, HTN and DM along with laryngeal dystonia has PT ordered after acute encephalopathy.  Sodium continues to decline, persistent confusion.   Clinical Impression   Pt admitted with the above diagnoses and presents with below problem list. Pt will benefit from continued OT to address the below listed deficits and maximize independence with BADLs. PTA pt was supervision to min A with ADLs. Pt currently min A for LB ADLs and functional transfers and mobility. Discussed AE and HHOT with son and cg. Pt d/c with transport services present at end of session. Recommend pt f/u with PCP if desires HHOT.      Follow Up Recommendations  Home health OT;Supervision/Assistance - 24 hour    Equipment Recommendations  Other (comment) (pt d/cing during session)    Recommendations for Other Services       Precautions / Restrictions Precautions Precautions: Fall Restrictions Weight Bearing Restrictions: No      Mobility Bed Mobility Overal bed mobility: Needs Assistance Bed Mobility: Supine to Sit     Supine to sit: Min assist;Mod assist     General bed mobility comments: assist for powerup and use of bedrails  Transfers Overall transfer level: Needs assistance Equipment used: Straight cane Transfers: Sit to/from Stand Sit to Stand: Min assist         General transfer comment: from EOB cues for technique. min steadying A    Balance Overall balance assessment: Needs assistance Sitting-balance support: No upper extremity supported;Feet supported Sitting balance-Leahy Scale: Good Sitting balance - Comments: dressed EOB   Standing balance support: Single extremity supported Standing balance-Leahy Scale: Fair Standing balance comment: cane and min A during dynamic standing tasks                             ADL Overall ADL's : Needs assistance/impaired Eating/Feeding: Set up;Sitting   Grooming: Set up;Sitting   Upper Body Bathing: Minimal assitance;Sitting   Lower Body Bathing: Minimal assistance;Sit to/from stand   Upper Body Dressing : Minimal assistance;Sitting   Lower Body Dressing: Minimal assistance;Sit to/from stand   Toilet Transfer: Minimal assistance;Min guard;Ambulation;BSC Toilet Transfer Details (indicate cue type and reason): cane Toileting- Clothing Manipulation and Hygiene: Set up;Sitting/lateral lean   Tub/ Shower Transfer: Minimal assistance;Ambulation Tub/Shower Transfer Details (indicate cue type and reason): cane Functional mobility during ADLs: Minimal assistance;Min guard;Cane General ADL Comments: Pt awaiting d/c upon therapist arrival. Completed UB/LB dressing as detailed above. Discussed AE for LB ADLs with pt and son as pt with difficulty accessing feet. Laying back on bed to complete fasteners on pants. Per pt's son pt functioning close to baseline with ADLs. Pt trasnferred to w/c with transport services with min steadying A and cues for safety.     Vision     Perception     Praxis      Pertinent Vitals/Pain Pain Assessment: No/denies pain     Hand Dominance     Extremity/Trunk Assessment Upper Extremity Assessment Upper Extremity Assessment: Generalized weakness   Lower Extremity Assessment Lower Extremity Assessment: Defer to PT evaluation       Communication Communication Communication: Expressive difficulties (laryngeal dystonia but understandable)   Cognition Arousal/Alertness: Awake/alert Behavior During Therapy: WFL for tasks assessed/performed Overall Cognitive Status: Within Functional Limits for tasks assessed       Memory: Decreased  short-term memory             General Comments       Exercises       Shoulder Instructions      Home Living Family/patient expects to be discharged to:: Private  residence Living Arrangements: Children;Other relatives Available Help at Discharge: Family;Available 24 hours/day Type of Home: House Home Access: Stairs to enter [employer] of Steps: 3 Entrance Stairs-Rails: Right Home Layout: One level;Laundry or work area in [place]: Programmer, systems: Yes   Home Equipment: Environmental consultant - 2 wheels;Doke - 4 wheels;Cane - single point          Prior Functioning/Environment Level of Independence: Independent with assistive device(s);Needs assistance  Gait / Transfers Assistance Needed: cane ADL's / Homemaking Assistance Needed: Per pt he needs no physical A "someone's around just in case"; Per son pt needing some assist with dressing/bathing recently likely setup to min guard   Comments: has gradually decreased his activity per his daughter, sits more    OT Diagnosis: Generalized weakness   OT Problem List: Impaired balance (sitting and/or standing);Decreased knowledge of use of DME or AE;Decreased knowledge of precautions;Decreased activity tolerance;Decreased safety awareness   OT Treatment/Interventions:      OT Goals(Current goals can be found in the care plan section) Acute Rehab OT Goals Patient Stated Goal: to walk and get home  OT Frequency:     Barriers to D/C:            Co-evaluation              End of Session Equipment Utilized During Treatment: Gait belt (cane)  Activity Tolerance: Patient tolerated treatment well Patient left: with family/visitor present;Other (comment) (in w/c with transport services)   Time: 1520-1540 OT Time Calculation (min): 20 min Charges:  OT General Charges $OT Visit: 1 Procedure OT Treatments $Self Care/Home Management : 8-22 mins G-Codes:    Hortencia Pilar 2015/08/16, 3:58 PM

## 2015-07-21 NOTE — Discharge Summary (Signed)
Physician Discharge Summary  Jose Kennedy [id number] DOB: 01-05-1928 DOA: 07/19/2015  PCP: Pcp Not In System  Admit date: 07/19/2015 Discharge date: 07/21/2015  Time spent: > 35 minutes  Recommendations for Outpatient Follow-up:  1. Monitor sodium levels 2. Monitor blood pressures and adjust his antihypertensive regimen accordingly.   Discharge Diagnoses:  Active Problems:   Coronary artery disease   CKD (chronic kidney disease) stage 3, GFR 30-59 ml/min   Hyponatremia   Diabetes mellitus with diabetic nephropathy (HCC)   Anemia of chronic disease   Acute encephalopathy   Chronic diastolic CHF (congestive heart failure) ([place])   Discharge Condition: stable  Diet recommendation: dysphagia 3 diet  There were no vitals filed for this visit.  History of present illness:   Patient is an 82 year old who was recently admitted secondary to hyponatremia on post hospital follow-up was found to have hyponatremia can and was sent to the hospital for further evaluation recommendations.  Hospital Course:  Hyponatremia -Resolved off of hydrochlorothiazide and with normal saline administration. - Discussed increasing salt intake as outpatient with family. Also recommended continuing patient off of hydrochlorothiazide moving forward.  Hypertension - Continue home medication regimen but discontinue hydrochlorothiazide for reasons listed above.  Otherwise for no medical conditions we'll continue medication list listed below  Procedures:  None  Consultations:  None  Discharge Exam: Filed Vitals:   07/21/15 0514 07/21/15 0930  BP: 166/72 157/71  Pulse: 66 68  Temp: 98.8 F (37.1 C)   Resp: 18     General: Patient in no acute distress, alert and awake Cardiovascular: Regular rate and rhythm, no murmurs or rubs Respiratory: CTA BL, no wheezes  Discharge Instructions   Discharge Instructions    Call MD for:  difficulty breathing, headache or visual disturbances    Complete  by:  As directed      Call MD for:  temperature >100.4    Complete by:  As directed      Diet - low sodium heart healthy    Complete by:  As directed      Discharge instructions    Complete by:  As directed   F/u with your primary care physician in 1-2 weeks or sooner should any new conditions arise.     Increase activity slowly    Complete by:  As directed           Current Discharge Medication List    CONTINUE these medications which have NOT CHANGED   Details  amLODipine (NORVASC) 10 MG tablet Take 10 mg by mouth daily.    aspirin EC 81 MG tablet Take 81 mg by mouth daily.    docusate sodium (COLACE) 100 MG capsule Take 100 mg by mouth 2 (two) times daily.    ferrous sulfate 325 (65 FE) MG tablet Take 325 mg by mouth daily with breakfast.     hydrALAZINE (APRESOLINE) 10 MG tablet Take 10 mg by mouth 3 (three) times daily.    insulin NPH-insulin regular (NOVOLIN 70/30) (70-30) 100 UNIT/ML injection Inject 10 Units into the skin 2 (two) times daily with a meal.     losartan (COZAAR) 100 MG tablet Take 100 mg by mouth daily.    Omeprazole-Sodium Bicarbonate (ZEGERID) 20-1100 MG CAPS capsule Take 1 capsule by mouth daily before breakfast.    pravastatin (PRAVACHOL) 80 MG tablet Take 40 mg by mouth at bedtime.  Refills: 0    sucralfate (CARAFATE) 1 G tablet Take 1 g by mouth 4 (four) times daily.  carvedilol (COREG) 12.5 MG tablet Take 1 tablet (12.5 mg total) by mouth 2 (two) times daily with a meal. Qty: 60 tablet, Refills: 1       No Known Allergies    The results of significant diagnostics from this hospitalization (including imaging, microbiology, ancillary and laboratory) are listed below for reference.    Significant Diagnostic Studies: Dg Chest 2 View  07/15/2015  CLINICAL DATA:  82 year old male with cough and congestion for 4 days. Subsequent encounter. EXAM: CHEST  2 VIEW COMPARISON:  06/23/2015. FINDINGS: Dilated calcified tortuous aorta. Heart size  top-normal to minimally enlarged. Central pulmonary vascular prominence and minimal peribronchial thickening without frank pulmonary edema. No segmental consolidation or pneumothorax. IMPRESSION: Central pulmonary vascular prominence and minimal peribronchial thickening without frank pulmonary edema. Dilated calcified tortuous aorta. Electronically Signed   By: Genia Del M.D.   On: 07/15/2015 07:51   Dg Chest 2 View  06/23/2015  CLINICAL DATA:  Productive cough and shortness of breath today. Mid chest pain. Former smoker. EXAM: CHEST  2 VIEW COMPARISON:  09/21/2013 FINDINGS: Mild cardiomegaly and ectasia the thoracic aorta are stable. Both lungs are clear. Previously seen area of airspace disease in the left lung base is no longer visualized. No evidence of pleural effusion. IMPRESSION: Stable mild cardiomegaly.  No active lung disease. Electronically Signed   By: Earle Gell M.D.   On: 06/23/2015 19:57   Dg Abd 1 View  07/19/2015  CLINICAL DATA:  82 year old male with constipation and weakness. EXAM: ABDOMEN - 1 VIEW COMPARISON:  CT dated 07/15/2015 FINDINGS: Large amount of S at the dense stool noted throughout the colon and rectosigmoid compatible with constipation. His there is no evidence of bowel obstruction or free air. No radiopaque calculi identified. Right upper quadrant cholecystectomy clips noted. There is degenerative changes of the spine. No acute osseous pathology identified. IMPRESSION: Constipation.  No bowel obstruction. Electronically Signed   By: Anner Crete M.D.   On: 07/19/2015 21:28   Ct Abdomen Pelvis W Contrast  07/15/2015  ADDENDUM REPORT: 07/15/2015 07:53 ADDENDUM: First impression should read: Periumbilical bowel containing hernia. There are 2 segments of small bowel entering this hernia. The superior segment is dilated with abrupt change in caliber suggesting that the hernia is causing partial small bowel obstruction. These results will be called to the ordering  clinician or representative by the Radiologist Assistant, and communication documented in the PACS or zVision Dashboard. Electronically Signed   By: Genia Del M.D.   On: 07/15/2015 07:53  07/15/2015  CLINICAL DATA:  82 year old diabetic hypertensive male with history of stomach ulcers presenting with nausea and vomiting. Initial encounter. EXAM: CT ABDOMEN AND PELVIS WITH CONTRAST TECHNIQUE: Multidetector CT imaging of the abdomen and pelvis was performed using the standard protocol following bolus administration of intravenous contrast. CONTRAST:  147mL OMNIPAQUE IOHEXOL 300 MG/ML  SOLN COMPARISON:  No comparison CT. FINDINGS: Lower chest: Tiny calcified granulomas lung bases with areas of scarring without worrisome lesion noted. Hepatobiliary: No masses or other significant abnormality. Pancreas: No mass, inflammatory changes, or other significant abnormality. Spleen: Within normal limits in size and appearance. Adrenals/Urinary Tract: Multiple bilateral renal cysts measuring up to 7 cm. Distortion of nonobstructing collecting system. No adrenal abnormality. Stomach/Bowel: Under distended stomach with thickened folds and limited evaluation for the possibility of underlying ulcer. Hiatal hernia. Periumbilical small bowel containing hernia. There are 2 segments of small bowel entering this hernia with the superior segment dilated suggesting hernia this may be causing partial obstruction.  History of prior appendectomy. No free air or free fluid. Fatty containing inguinal hernias. Vascular/Lymphatic: Prominent calcified plaque aorta and aortic branches. No high-grade stenosis. Ectasia without focal aneurysm. Calcified plaque iliac arteries with common iliac artery ectasia bilaterally. Narrowing origin of the celiac artery and superior mesenteric artery. No adenopathy. Reproductive: Slightly heterogeneous lobulated prostate gland. Clinical and laboratory correlation recommended. Other: None. Musculoskeletal: Facet  degenerative changes L4-5 and L5-S1. Grade 1 anterior slip L4. Bilateral foraminal narrowing and spinal stenosis. Bilateral hip joint degenerative changes. Fusion sacroiliac joints. No worrisome orbital osseous abnormality. IMPRESSION: Periumbilical small bowel containing hernia. There are 2 segments of small bowel entering this hernia with the superior segment dilated suggesting hernia this may be causing partial obstruction. Under distended stomach with thickened folds and limited evaluation for the possibility of underlying ulcer. Hiatal hernia. Multiple bilateral renal cysts measuring up to 7 cm. Distortion of nonobstructing collecting system. Atherosclerotic changes as detailed above. Slightly heterogeneous lobulated prostate gland. Clinical and laboratory correlation recommended. Recommended. Electronically Signed: By: Genia Del M.D. On: 07/15/2015 07:47    Microbiology: Recent Results (from the past 240 hour(s))  Urine culture     Status: None   Collection Time: 07/13/15  7:05 PM  Result Value Ref Range Status   Specimen Description URINE, RANDOM  Final   Special Requests NONE  Final   Culture MULTIPLE SPECIES PRESENT, SUGGEST RECOLLECTION  Final   Report Status 07/15/2015 FINAL  Final     Labs: Basic Metabolic Panel:  Recent Labs Lab 07/14/15 1246  07/20/15 0443  07/20/15 1320 07/20/15 1756 07/20/15 2048 07/21/15 0029 07/21/15 0605  NA  --   < > 128*  < > 131* 130* 130* 132* 134*  K  --   < > 3.1*  < > 3.4* 3.6 3.6 3.4* 3.6  CL  --   < > 90*  < > 92* 93* 96* 95* 96*  CO2  --   < > 30  < > 31 30 27 30 30   GLUCOSE  --   < > 134*  < > 151* 162* 204* 140* 113*  BUN  --   < > 13  < > 11 11 12 11 10   CREATININE  --   < > 0.98  < > 0.90 1.03 1.02 0.95 0.96  CALCIUM  --   < > 8.4*  < > 8.5* 8.7* 8.5* 8.5* 8.7*  MG 1.9  --  1.8  --   --   --   --   --   --   PHOS  --   --  3.3  --   --   --   --   --   --   < > = values in this interval not displayed. Liver Function  Tests:  Recent Labs Lab 07/15/15 0734 07/19/15 1814 07/20/15 0443  AST 50* 40 35  ALT 23 24 25   ALKPHOS 43 46 42  BILITOT 0.5 0.3 0.6  PROT 5.5* 5.9* 5.2*  ALBUMIN 2.9* 3.2* 2.8*   No results for input(s): LIPASE, AMYLASE in the last 168 hours. No results for input(s): AMMONIA in the last 168 hours. CBC:  Recent Labs Lab 07/15/15 0734 07/19/15 1814 07/20/15 0443  WBC 6.2 5.7 5.9  HGB 10.1* 10.2* 9.9*  HCT 30.0* 31.4* 29.5*  MCV 80.9 80.5 81.0  PLT 256 295 283   Cardiac Enzymes: No results for input(s): CKTOTAL, CKMB, CKMBINDEX, TROPONINI in the last 168 hours. BNP: BNP (last 3 results)  No results for input(s): BNP in the last 8760 hours.  ProBNP (last 3 results) No results for input(s): PROBNP in the last 8760 hours.  CBG:  Recent Labs Lab 07/20/15 0805 07/20/15 1225 07/20/15 1709 07/20/15 2116 07/21/15 0802  GLUCAP 118* 164* 150* 189* 106*     Signed:  Velvet Bathe MD.  [facility] 07/21/2015, 12:34 PM

## 2015-07-21 NOTE — Progress Notes (Signed)
NURSING PROGRESS NOTE  Jose Kennedy [id number] Discharge Data: 07/21/2015 2:22 PM Attending Provider: Velvet Bathe, MD PCP:Pcp Not In System     [place] to be D/C'd Home per MD order.  Discussed with the patient the After Visit Summary and all questions fully answered. All IV's discontinued with no bleeding noted. All belongings returned to patient for patient to take home.   Last Vital Signs:  Blood pressure 141/60, pulse 71, temperature 98.7 F (37.1 C), temperature source Oral, resp. rate 20, height 5\' 11"  (1.803 m), SpO2 98 %.  Discharge Medication List   Medication List    TAKE these medications        amLODipine 10 MG tablet  Commonly known as:  NORVASC  Take 10 mg by mouth daily.     aspirin EC 81 MG tablet  Take 81 mg by mouth daily.     carvedilol 12.5 MG tablet  Commonly known as:  COREG  Take 1 tablet (12.5 mg total) by mouth 2 (two) times daily with a meal.     docusate sodium 100 MG capsule  Commonly known as:  COLACE  Take 100 mg by mouth 2 (two) times daily.     ferrous sulfate 325 (65 FE) MG tablet  Take 325 mg by mouth daily with breakfast.     hydrALAZINE 10 MG tablet  Commonly known as:  APRESOLINE  Take 10 mg by mouth 3 (three) times daily.     insulin NPH-regular Human (70-30) 100 UNIT/ML injection  Commonly known as:  NOVOLIN 70/30  Inject 10 Units into the skin 2 (two) times daily with a meal.     losartan 100 MG tablet  Commonly known as:  COZAAR  Take 100 mg by mouth daily.     Omeprazole-Sodium Bicarbonate 20-1100 MG Caps capsule  Commonly known as:  ZEGERID  Take 1 capsule by mouth daily before breakfast.     pravastatin 80 MG tablet  Commonly known as:  PRAVACHOL  Take 40 mg by mouth at bedtime.     sucralfate 1 g tablet  Commonly known as:  CARAFATE  Take 1 g by mouth 4 (four) times daily.         Charolette Child, RN

## 2015-07-21 NOTE — Progress Notes (Signed)
Utilization review completed.  

## 2015-07-21 NOTE — Care Management Note (Signed)
Case Management Note  Patient Details  Name: Jose Kennedy MRN: [id number] Date of Birth: 02-03-1928  Subjective/Objective:                  confusion Action/Plan: Discharge planning Expected Discharge Date:  07/21/15               Expected Discharge Plan:  [place]  In-House Referral:     Discharge planning Services  CM Consult  Post Acute Care Choice:  Home Health Choice offered to:  Adult Children, Patient  DME Arranged:  N/A DME Agency:  NA  HH Arranged:  PT [place] Agency:  [facility]  Status of Service:  Completed, signed off  Medicare Important Message Given:    Date Medicare IM Given:    Medicare IM give by:    Date Additional Medicare IM Given:    Additional Medicare Important Message give by:     If discussed at [place] of Stay Meetings, dates discussed:    Additional Comments: CM met with pt and son in room to offer choice of home health agency for recommended and ordered HHPT.  Pt requests I speak with his daughter, Jose Kennedy [phone] concerning HH.  CM called Jose Kennedy who chooses [facility] to render HHPT.  Jose Kennedy requests she be primary contact for her father and states for the home health agency to feel free to leave a detailed message as she might not be able to immediately pick up. Referral called to [facility] rep, Tiffany with Sharon's contact information.  No other CM needs were communicated. Dellie Catholic, RN 07/21/2015, 2:15 PM

## 2015-07-22 LAB — HEMOGLOBIN A1C
Hgb A1c MFr Bld: 7.1 % — ABNORMAL HIGH (ref 4.8–5.6)
Mean Plasma Glucose: 157 mg/dL

## 2016-02-16 ENCOUNTER — Encounter (HOSPITAL_COMMUNITY): Payer: Self-pay | Admitting: Emergency Medicine

## 2016-02-16 ENCOUNTER — Emergency Department (HOSPITAL_COMMUNITY): Payer: Medicare Other

## 2016-02-16 ENCOUNTER — Inpatient Hospital Stay (HOSPITAL_COMMUNITY)
Admission: EM | Admit: 2016-02-16 | Discharge: 2016-02-21 | DRG: 689 | Disposition: A | Discharge status: Home / Self Care | Payer: Medicare Other | Attending: Internal Medicine | Admitting: Internal Medicine

## 2016-02-16 DIAGNOSIS — R410 Disorientation, unspecified: Secondary | ICD-10-CM | POA: Diagnosis present

## 2016-02-16 DIAGNOSIS — Z794 Long term (current) use of insulin: Secondary | ICD-10-CM

## 2016-02-16 DIAGNOSIS — Z23 Encounter for immunization: Secondary | ICD-10-CM

## 2016-02-16 DIAGNOSIS — G9340 Encephalopathy, unspecified: Secondary | ICD-10-CM | POA: Diagnosis not present

## 2016-02-16 DIAGNOSIS — R509 Fever, unspecified: Secondary | ICD-10-CM

## 2016-02-16 DIAGNOSIS — E1122 Type 2 diabetes mellitus with diabetic chronic kidney disease: Secondary | ICD-10-CM | POA: Diagnosis present

## 2016-02-16 DIAGNOSIS — I5032 Chronic diastolic (congestive) heart failure: Secondary | ICD-10-CM | POA: Diagnosis not present

## 2016-02-16 DIAGNOSIS — R739 Hyperglycemia, unspecified: Secondary | ICD-10-CM

## 2016-02-16 DIAGNOSIS — Z961 Presence of intraocular lens: Secondary | ICD-10-CM | POA: Diagnosis present

## 2016-02-16 DIAGNOSIS — I25119 Atherosclerotic heart disease of native coronary artery with unspecified angina pectoris: Secondary | ICD-10-CM

## 2016-02-16 DIAGNOSIS — I130 Hypertensive heart and chronic kidney disease with heart failure and stage 1 through stage 4 chronic kidney disease, or unspecified chronic kidney disease: Secondary | ICD-10-CM | POA: Diagnosis present

## 2016-02-16 DIAGNOSIS — N3000 Acute cystitis without hematuria: Secondary | ICD-10-CM | POA: Diagnosis not present

## 2016-02-16 DIAGNOSIS — R001 Bradycardia, unspecified: Secondary | ICD-10-CM | POA: Diagnosis not present

## 2016-02-16 DIAGNOSIS — N179 Acute kidney failure, unspecified: Secondary | ICD-10-CM | POA: Diagnosis present

## 2016-02-16 DIAGNOSIS — Z9841 Cataract extraction status, right eye: Secondary | ICD-10-CM

## 2016-02-16 DIAGNOSIS — K224 Dyskinesia of esophagus: Secondary | ICD-10-CM | POA: Diagnosis present

## 2016-02-16 DIAGNOSIS — E889 Metabolic disorder, unspecified: Secondary | ICD-10-CM | POA: Diagnosis present

## 2016-02-16 DIAGNOSIS — N183 Chronic kidney disease, stage 3 unspecified: Secondary | ICD-10-CM | POA: Diagnosis present

## 2016-02-16 DIAGNOSIS — F05 Delirium due to known physiological condition: Secondary | ICD-10-CM | POA: Diagnosis present

## 2016-02-16 DIAGNOSIS — Z7982 Long term (current) use of aspirin: Secondary | ICD-10-CM

## 2016-02-16 DIAGNOSIS — E860 Dehydration: Secondary | ICD-10-CM | POA: Diagnosis present

## 2016-02-16 DIAGNOSIS — J449 Chronic obstructive pulmonary disease, unspecified: Secondary | ICD-10-CM | POA: Diagnosis present

## 2016-02-16 DIAGNOSIS — R1310 Dysphagia, unspecified: Secondary | ICD-10-CM | POA: Diagnosis present

## 2016-02-16 DIAGNOSIS — Z8673 Personal history of transient ischemic attack (TIA), and cerebral infarction without residual deficits: Secondary | ICD-10-CM

## 2016-02-16 DIAGNOSIS — E1121 Type 2 diabetes mellitus with diabetic nephropathy: Secondary | ICD-10-CM | POA: Diagnosis present

## 2016-02-16 DIAGNOSIS — R740 Nonspecific elevation of levels of transaminase and lactic acid dehydrogenase [LDH]: Secondary | ICD-10-CM | POA: Diagnosis not present

## 2016-02-16 DIAGNOSIS — R7881 Bacteremia: Secondary | ICD-10-CM

## 2016-02-16 DIAGNOSIS — I2510 Atherosclerotic heart disease of native coronary artery without angina pectoris: Secondary | ICD-10-CM | POA: Diagnosis present

## 2016-02-16 DIAGNOSIS — K219 Gastro-esophageal reflux disease without esophagitis: Secondary | ICD-10-CM | POA: Diagnosis present

## 2016-02-16 DIAGNOSIS — Z87891 Personal history of nicotine dependence: Secondary | ICD-10-CM

## 2016-02-16 DIAGNOSIS — N390 Urinary tract infection, site not specified: Secondary | ICD-10-CM

## 2016-02-16 DIAGNOSIS — E785 Hyperlipidemia, unspecified: Secondary | ICD-10-CM | POA: Diagnosis present

## 2016-02-16 DIAGNOSIS — E1165 Type 2 diabetes mellitus with hyperglycemia: Secondary | ICD-10-CM | POA: Diagnosis present

## 2016-02-16 DIAGNOSIS — D638 Anemia in other chronic diseases classified elsewhere: Secondary | ICD-10-CM | POA: Diagnosis present

## 2016-02-16 DIAGNOSIS — Z79899 Other long term (current) drug therapy: Secondary | ICD-10-CM

## 2016-02-16 LAB — COMPREHENSIVE METABOLIC PANEL
ALBUMIN: 3.2 g/dL — AB (ref 3.5–5.0)
ALT: 349 U/L — AB (ref 17–63)
AST: 521 U/L — AB (ref 15–41)
Alkaline Phosphatase: 135 U/L — ABNORMAL HIGH (ref 38–126)
Anion gap: 9 (ref 5–15)
BILIRUBIN TOTAL: 1.1 mg/dL (ref 0.3–1.2)
BUN: 26 mg/dL — AB (ref 6–20)
CHLORIDE: 96 mmol/L — AB (ref 101–111)
CO2: 31 mmol/L (ref 22–32)
CREATININE: 1.25 mg/dL — AB (ref 0.61–1.24)
Calcium: 9.2 mg/dL (ref 8.9–10.3)
GFR calc Af Amer: 58 mL/min — ABNORMAL LOW (ref >60)
GFR calc non Af Amer: 50 mL/min — ABNORMAL LOW (ref >60)
GLUCOSE: 183 mg/dL — AB (ref 65–99)
POTASSIUM: 3.8 mmol/L (ref 3.5–5.1)
Sodium: 136 mmol/L (ref 135–145)
TOTAL PROTEIN: 6 g/dL — AB (ref 6.5–8.1)

## 2016-02-16 LAB — CBC
HEMATOCRIT: 38.9 % — AB (ref 39.0–52.0)
Hemoglobin: 12.4 g/dL — ABNORMAL LOW (ref 13.0–17.0)
MCH: 26.8 pg (ref 26.0–34.0)
MCHC: 31.9 g/dL (ref 30.0–36.0)
MCV: 84.2 fL (ref 78.0–100.0)
PLATELETS: 193 10*3/uL (ref 150–400)
RBC: 4.62 MIL/uL (ref 4.22–5.81)
RDW: 13.8 % (ref 11.5–15.5)
WBC: 4.3 10*3/uL (ref 4.0–10.5)

## 2016-02-16 LAB — URINALYSIS, ROUTINE W REFLEX MICROSCOPIC
BILIRUBIN URINE: NEGATIVE
GLUCOSE, UA: NEGATIVE mg/dL
KETONES UR: NEGATIVE mg/dL
Nitrite: NEGATIVE
PH: 7 (ref 5.0–8.0)
PROTEIN: 30 mg/dL — AB
Specific Gravity, Urine: 1.014 (ref 1.005–1.030)

## 2016-02-16 LAB — AMMONIA: AMMONIA: 33 umol/L (ref 9–35)

## 2016-02-16 LAB — URINE MICROSCOPIC-ADD ON

## 2016-02-16 LAB — LIPASE, BLOOD: Lipase: 24 U/L (ref 11–51)

## 2016-02-16 LAB — TSH: TSH: 0.325 u[IU]/mL — AB (ref 0.350–4.500)

## 2016-02-16 LAB — LACTIC ACID, PLASMA
Lactic Acid, Venous: 1.6 mmol/L (ref 0.5–1.9)
Lactic Acid, Venous: 2.6 mmol/L (ref 0.5–1.9)

## 2016-02-16 LAB — VITAMIN B12: Vitamin B-12: 761 pg/mL (ref 180–914)

## 2016-02-16 LAB — I-STAT CG4 LACTIC ACID, ED
LACTIC ACID, VENOUS: 1.74 mmol/L (ref 0.5–1.9)
Lactic Acid, Venous: 3.12 mmol/L (ref 0.5–1.9)

## 2016-02-16 MED ORDER — ONDANSETRON HCL 4 MG/2ML IJ SOLN: 4.0000 mg | Freq: Four times a day (QID) | INTRAMUSCULAR | Status: DC | PRN

## 2016-02-16 MED ORDER — ONDANSETRON HCL 4 MG/2ML IJ SOLN
4.0000 mg | Freq: Once | INTRAMUSCULAR | Status: AC
  Administered 2016-02-16: 4 mg via INTRAVENOUS
  Filled 2016-02-16: qty 2

## 2016-02-16 MED ORDER — FERROUS SULFATE 325 (65 FE) MG PO TABS
325.0000 mg | ORAL_TABLET | Freq: Every day | ORAL | Status: DC
  Administered 2016-02-17 – 2016-02-21 (×5): 325 mg via ORAL
  Filled 2016-02-16 (×5): qty 1

## 2016-02-16 MED ORDER — LOSARTAN POTASSIUM 50 MG PO TABS: 100.0000 mg | ORAL_TABLET | Freq: Every day | ORAL | Status: DC

## 2016-02-16 MED ORDER — DOCUSATE SODIUM 100 MG PO CAPS
100.0000 mg | ORAL_CAPSULE | Freq: Two times a day (BID) | ORAL | Status: DC
  Administered 2016-02-16 – 2016-02-21 (×10): 100 mg via ORAL
  Filled 2016-02-16 (×10): qty 1

## 2016-02-16 MED ORDER — SODIUM CHLORIDE 0.9 % IV SOLN
INTRAVENOUS | Status: DC
  Administered 2016-02-16 – 2016-02-19 (×6): via INTRAVENOUS

## 2016-02-16 MED ORDER — SUCRALFATE 1 G PO TABS
1.0000 g | ORAL_TABLET | Freq: Four times a day (QID) | ORAL | Status: DC
  Administered 2016-02-16 – 2016-02-21 (×19): 1 g via ORAL
  Filled 2016-02-16 (×19): qty 1

## 2016-02-16 MED ORDER — ASPIRIN EC 81 MG PO TBEC
81.0000 mg | DELAYED_RELEASE_TABLET | Freq: Every day | ORAL | Status: DC
  Administered 2016-02-17 – 2016-02-21 (×5): 81 mg via ORAL
  Filled 2016-02-16 (×5): qty 1

## 2016-02-16 MED ORDER — BISACODYL 10 MG RE SUPP: 10.0000 mg | Freq: Every day | RECTAL | Status: DC | PRN

## 2016-02-16 MED ORDER — ACETAMINOPHEN 325 MG PO TABS
650.0000 mg | ORAL_TABLET | Freq: Four times a day (QID) | ORAL | Status: DC | PRN
  Administered 2016-02-20: 650 mg via ORAL
  Filled 2016-02-16: qty 2

## 2016-02-16 MED ORDER — SENNOSIDES-DOCUSATE SODIUM 8.6-50 MG PO TABS: 1.0000 | ORAL_TABLET | Freq: Every evening | ORAL | Status: DC | PRN

## 2016-02-16 MED ORDER — HEPARIN SODIUM (PORCINE) 5000 UNIT/ML IJ SOLN
5000.0000 [IU] | Freq: Three times a day (TID) | INTRAMUSCULAR | Status: DC
  Administered 2016-02-16 – 2016-02-21 (×14): 5000 [IU] via SUBCUTANEOUS
  Filled 2016-02-16 (×14): qty 1

## 2016-02-16 MED ORDER — SODIUM CHLORIDE 0.9 % IV BOLUS (SEPSIS)
1000.0000 mL | Freq: Once | INTRAVENOUS | Status: AC
  Administered 2016-02-16: 1000 mL via INTRAVENOUS

## 2016-02-16 MED ORDER — MAGNESIUM CITRATE PO SOLN: 1.0000 | Freq: Once | ORAL | Status: DC | PRN

## 2016-02-16 MED ORDER — ONDANSETRON HCL 4 MG PO TABS: 4.0000 mg | ORAL_TABLET | Freq: Four times a day (QID) | ORAL | Status: DC | PRN

## 2016-02-16 MED ORDER — ASPIRIN 81 MG PO CHEW
324.0000 mg | CHEWABLE_TABLET | Freq: Once | ORAL | Status: AC
  Administered 2016-02-16: 324 mg via ORAL
  Filled 2016-02-16: qty 4

## 2016-02-16 MED ORDER — DEXTROSE 5 % IV SOLN
1.0000 g | Freq: Once | INTRAVENOUS | Status: AC
  Administered 2016-02-16: 1 g via INTRAVENOUS
  Filled 2016-02-16: qty 10

## 2016-02-16 MED ORDER — TRAZODONE HCL 50 MG PO TABS: 25.0000 mg | ORAL_TABLET | Freq: Every evening | ORAL | Status: DC | PRN

## 2016-02-16 MED ORDER — PANTOPRAZOLE SODIUM 40 MG PO TBEC
40.0000 mg | DELAYED_RELEASE_TABLET | Freq: Every day | ORAL | Status: DC
  Administered 2016-02-17 – 2016-02-21 (×5): 40 mg via ORAL
  Filled 2016-02-16 (×5): qty 1

## 2016-02-16 MED ORDER — ACETAMINOPHEN 500 MG PO TABS
1000.0000 mg | ORAL_TABLET | Freq: Once | ORAL | Status: AC
  Administered 2016-02-16: 1000 mg via ORAL
  Filled 2016-02-16: qty 2

## 2016-02-16 MED ORDER — HYDRALAZINE HCL 10 MG PO TABS
10.0000 mg | ORAL_TABLET | Freq: Three times a day (TID) | ORAL | Status: DC
  Administered 2016-02-16 – 2016-02-21 (×13): 10 mg via ORAL
  Filled 2016-02-16 (×17): qty 1

## 2016-02-16 MED ORDER — DEXTROSE 5 % IV SOLN: 1.0000 g | INTRAVENOUS | Status: DC

## 2016-02-16 MED ORDER — INFLUENZA VAC SPLIT QUAD 0.5 ML IM SUSY
0.5000 mL | PREFILLED_SYRINGE | INTRAMUSCULAR | Status: AC
  Administered 2016-02-18: 0.5 mL via INTRAMUSCULAR
  Filled 2016-02-16: qty 0.5

## 2016-02-16 MED ORDER — INSULIN ASPART 100 UNIT/ML ~~LOC~~ SOLN
0.0000 [IU] | Freq: Three times a day (TID) | SUBCUTANEOUS | Status: DC
  Administered 2016-02-17: 2 [IU] via SUBCUTANEOUS
  Administered 2016-02-17: 1 [IU] via SUBCUTANEOUS
  Administered 2016-02-18 – 2016-02-19 (×4): 2 [IU] via SUBCUTANEOUS
  Administered 2016-02-20 (×2): 1 [IU] via SUBCUTANEOUS
  Administered 2016-02-20 – 2016-02-21 (×2): 2 [IU] via SUBCUTANEOUS
  Administered 2016-02-21: 1 [IU] via SUBCUTANEOUS

## 2016-02-16 MED ORDER — AMLODIPINE BESYLATE 10 MG PO TABS
10.0000 mg | ORAL_TABLET | Freq: Every day | ORAL | Status: DC
  Administered 2016-02-17: 10 mg via ORAL
  Filled 2016-02-16: qty 1

## 2016-02-16 MED ORDER — PRAVASTATIN SODIUM 40 MG PO TABS
40.0000 mg | ORAL_TABLET | Freq: Every day | ORAL | Status: DC
  Administered 2016-02-16 – 2016-02-20 (×5): 40 mg via ORAL
  Filled 2016-02-16 (×5): qty 1

## 2016-02-16 MED ORDER — CARVEDILOL 12.5 MG PO TABS
12.5000 mg | ORAL_TABLET | Freq: Two times a day (BID) | ORAL | Status: DC
  Administered 2016-02-17 – 2016-02-21 (×9): 12.5 mg via ORAL
  Filled 2016-02-16 (×6): qty 1
  Filled 2016-02-16: qty 2
  Filled 2016-02-16 (×2): qty 1

## 2016-02-16 MED ORDER — HYDROCODONE-ACETAMINOPHEN 5-325 MG PO TABS: 1.0000 | ORAL_TABLET | ORAL | Status: DC | PRN

## 2016-02-16 MED ORDER — ACETAMINOPHEN 650 MG RE SUPP: 650.0000 mg | Freq: Four times a day (QID) | RECTAL | Status: DC | PRN

## 2016-02-16 NOTE — ED Triage Notes (Signed)
Daughter stated, he fell last week.

## 2016-02-16 NOTE — ED Notes (Signed)
Attempted report x1. 

## 2016-02-16 NOTE — ED Provider Notes (Signed)
[facility] DEPT Provider Note   CSN: [id number] Arrival date & time: 02/16/16  1231     History   Chief Complaint Chief Complaint  Patient presents with  . Hypertension  . Abdominal Pain  . Shaking    HPI Jose Kennedy is a 82 y.o. male.  Pt presents to the ED this morning b/c family said he is confused.  The pt was at church this morning and c/o abdominal pain.  He was shaking and his BP became elevated.  The pt had some emesis when he arrived here.  The pt is a little confused and is a poor historian.  Pt was also incontinent of urine upon arrival to the room.      Past Medical History:  Diagnosis Date  . Coronary artery disease   . Esophageal spasm   . Esophageal ulcer    "was hospitalized 2-3 times for bleeding; no problems in the last couple years" (07/19/2015)  . GERD (gastroesophageal reflux disease)    "severe" (07/19/2015)  . Heart murmur   . High cholesterol   . History of stomach ulcers    "right as you go into the stomach" (07/19/2015)  . Hypertension   . Laryngeal dystonia    chronic, appears as it he's dry heaving  . Stroke [facility]) 1991   "gait is a little different since" (07/19/2015)  . Type II diabetes mellitus (HCC)    insulin dependent    Patient Active Problem List   Diagnosis Date Noted  . Acute encephalopathy 07/19/2015  . Chronic diastolic CHF (congestive heart failure) ([place]) 07/19/2015  . Acute confusion   . Hyponatremia 07/13/2015  . Acute confusional state of metabolic origin [id number]  . Diabetes mellitus with diabetic nephropathy ([place]) 07/13/2015  . Anemia of chronic disease 07/13/2015  . CKD (chronic kidney disease) stage 3, GFR 30-59 ml/min 07/29/2013  . Coronary artery disease     Past Surgical History:  Procedure Laterality Date  . APPENDECTOMY    . BACK SURGERY    . CATARACT EXTRACTION W/ INTRAOCULAR LENS IMPLANT Right   . ESOPHAGOGASTRODUODENOSCOPY  08/26/2011   Procedure: ESOPHAGOGASTRODUODENOSCOPY (EGD);  Surgeon: Wonda Horner, MD;  Location: [facility] ENDOSCOPY;  Service: Endoscopy;  Laterality: N/A;  . ESOPHAGOGASTRODUODENOSCOPY N/A 09/20/2013   Procedure: ESOPHAGOGASTRODUODENOSCOPY (EGD);  Surgeon: Jeryl Columbia, MD;  Location: [facility] ENDOSCOPY;  Service: Endoscopy;  Laterality: N/A;  . LAPAROSCOPIC CHOLECYSTECTOMY    . LARYNX SURGERY     nodule  . LUMBAR DISC SURGERY         Home Medications    Prior to Admission medications   Medication Sig Start Date End Date Taking? Authorizing Provider  amLODipine (NORVASC) 10 MG tablet Take 10 mg by mouth daily.    Historical Provider, MD  aspirin EC 81 MG tablet Take 81 mg by mouth daily.    Historical Provider, MD  carvedilol (COREG) 12.5 MG tablet Take 1 tablet (12.5 mg total) by mouth 2 (two) times daily with a meal. 07/30/13   Orson Eva, MD  docusate sodium (COLACE) 100 MG capsule Take 100 mg by mouth 2 (two) times daily.    Historical Provider, MD  ferrous sulfate 325 (65 FE) MG tablet Take 325 mg by mouth daily with breakfast.     Historical Provider, MD  hydrALAZINE (APRESOLINE) 10 MG tablet Take 10 mg by mouth 3 (three) times daily.    Historical Provider, MD  insulin NPH-insulin regular (NOVOLIN 70/30) (70-30) 100 UNIT/ML injection Inject 10 Units into the  skin 2 (two) times daily with a meal.     Historical Provider, MD  losartan (COZAAR) 100 MG tablet Take 100 mg by mouth daily.    Historical Provider, MD  Omeprazole-Sodium Bicarbonate (ZEGERID) 20-1100 MG CAPS capsule Take 1 capsule by mouth daily before breakfast.    Historical Provider, MD  pravastatin (PRAVACHOL) 80 MG tablet Take 40 mg by mouth at bedtime.  04/26/15   Historical Provider, MD  sucralfate (CARAFATE) 1 G tablet Take 1 g by mouth 4 (four) times daily.    Historical Provider, MD    Family History Family History  Problem Relation Age of Onset  . Hypertension Mother   . Stroke Mother   . Hypertension Father   . Stroke Father   . Hypertension Sister   . Diabetes type II Sister   .  Hypertension Brother   . Cancer Neg Hx   . CAD Neg Hx     Social History Social History  Substance Use Topics  . Smoking status: Former Smoker    Packs/day: 0.00    Types: Pipe, Cigars  . Smokeless tobacco: Former Systems developer    Types: Chew     Comment: "quit smoking in the 1970s"  . Alcohol use Yes     Comment: 07/19/2015 "might have a couple drinks a couple times/year"     Allergies   Review of patient's allergies indicates no known allergies.   Review of Systems Review of Systems  Constitutional: Positive for chills and fever.  Gastrointestinal: Positive for abdominal pain and vomiting.  All other systems reviewed and are negative.    Physical Exam Updated Vital Signs BP 123/91   Pulse 78   Temp 100.7 F (38.2 C) (Oral)   Resp 19   Ht 5\' 11"  (1.803 m)   Wt 210 lb (95.3 kg)   SpO2 93%   BMI 29.29 kg/m   Physical Exam  Constitutional: He appears well-developed and well-nourished.  HENT:  Head: Normocephalic and atraumatic.  Right Ear: External ear normal.  Left Ear: External ear normal.  Nose: Nose normal.  Mouth/Throat: Oropharynx is clear and moist.  Eyes: Conjunctivae and EOM are normal. Pupils are equal, round, and reactive to light.  Neck: Normal range of motion. Neck supple.  Cardiovascular: Normal rate, regular rhythm, normal heart sounds and intact distal pulses.   Pulmonary/Chest: Effort normal and breath sounds normal.  Abdominal: Soft. Bowel sounds are normal. There is generalized tenderness.  Musculoskeletal: Normal range of motion.  Neurological: He is alert.  Oriented to person and place.  Slow to respond.  Skin: Skin is warm.  Psychiatric: He has a normal mood and affect. His behavior is normal. Judgment and thought content normal.  Nursing note and vitals reviewed.    ED Treatments / Results  Labs (all labs ordered are listed, but only abnormal results are displayed) Labs Reviewed  COMPREHENSIVE METABOLIC PANEL - Abnormal; Notable for the  following:       Result Value   Chloride 96 (*)    Glucose, Bld 183 (*)    BUN 26 (*)    Creatinine, Ser 1.25 (*)    Total Protein 6.0 (*)    Albumin 3.2 (*)    AST 521 (*)    ALT 349 (*)    Alkaline Phosphatase 135 (*)    GFR calc non Af Amer 50 (*)    GFR calc Af Amer 58 (*)    All other components within normal limits  CBC - Abnormal; Notable  for the following:    Hemoglobin 12.4 (*)    HCT 38.9 (*)    All other components within normal limits  URINALYSIS, ROUTINE W REFLEX MICROSCOPIC (NOT AT [facility]) - Abnormal; Notable for the following:    Color, Urine AMBER (*)    APPearance CLOUDY (*)    Hgb urine dipstick SMALL (*)    Protein, ur 30 (*)    Leukocytes, UA TRACE (*)    All other components within normal limits  URINE MICROSCOPIC-ADD ON - Abnormal; Notable for the following:    Squamous Epithelial / LPF 0-5 (*)    Bacteria, UA MANY (*)    All other components within normal limits  I-STAT CG4 LACTIC ACID, ED - Abnormal; Notable for the following:    Lactic Acid, Venous 3.12 (*)    All other components within normal limits  CULTURE, BLOOD (ROUTINE X 2)  CULTURE, BLOOD (ROUTINE X 2)  URINE CULTURE  LIPASE, BLOOD  I-STAT CG4 LACTIC ACID, ED    EKG  EKG Interpretation  Date/Time:  Sunday February 16 2016 13:22:20 EDT Ventricular Rate:  87 PR Interval:    QRS Duration: 95 QT Interval:  358 QTC Calculation: 431 R Axis:   59 Text Interpretation:  Sinus rhythm Abnormal inferior Q waves Anterior infarct, old Confirmed by [facility] MD, Onita Pfluger ([id number]) on 02/16/2016 1:29:32 PM       Radiology Ct Abdomen Pelvis Wo Contrast  Result Date: 02/16/2016 CLINICAL DATA:  82 year old male with abdominal pain and fever. EXAM: CT ABDOMEN AND PELVIS WITHOUT CONTRAST TECHNIQUE: Multidetector CT imaging of the abdomen and pelvis was performed following the standard protocol without IV contrast. COMPARISON:  CT the abdomen and pelvis 07/15/2015. FINDINGS: Lower chest: Areas of mild  cylindrical bronchiectasis and scarring are noted in the lower lobes of the lungs bilaterally. Aortic atherosclerosis in the distal descending thoracic aorta. Small hiatal hernia. Hepatobiliary: No definite cystic or solid hepatic lesions are identified on today's noncontrast CT examination. Status post cholecystectomy. Pancreas: No definite pancreatic mass or peripancreatic inflammatory changes on today's noncontrast CT examination. Spleen: Unremarkable. Adrenals/Urinary Tract: Again noted are numerous low-attenuation lesions scattered throughout both kidneys, incompletely characterized on today's noncontrast CT examination, but the majority of these were previously characterized as simple cysts. No hydroureteronephrosis. Urinary bladder is unremarkable in appearance. Bilateral adrenal glands are normal in appearance. Stomach/Bowel: The unenhanced appearance of the stomach is normal. There is no pathologic dilatation of small bowel or colon. There is a short segment of mid to distal small bowel which extends into a small supraumbilical ventral hernia. Several colonic diverticulae are noted, without surrounding inflammatory changes to suggest an acute diverticulitis at this time. Status post appendectomy. Vascular/Lymphatic: Aortic atherosclerosis, without definite aneurysm in the abdominal or pelvic vasculature. No lymphadenopathy noted in the abdomen or pelvis. Reproductive: Prostate gland and seminal vesicles are unremarkable in appearance. Multiple serpiginous structures in the visualized portions of the scrotum bilaterally, suggestive of large varicoceles. Other: No significant volume of ascites.  No pneumoperitoneum. Musculoskeletal: There are no aggressive appearing lytic or blastic lesions noted in the visualized portions of the skeleton. IMPRESSION: 1. No acute findings to account for the patient's symptoms. 2. Mild colonic diverticulosis without evidence of acute diverticulitis at this time. 3. Small  supraumbilical ventral hernia containing a short segment of mid to distal small bowel, without evidence of incarceration or obstruction at this time. 4. Aortic atherosclerosis. 5. Multiple large low-attenuation lesions in the kidneys bilaterally, similar to the prior study, previously characterized  as cysts. 6. Additional incidental findings, as above. Electronically Signed   By: Vinnie Langton M.D.   On: 02/16/2016 15:40   Dg Chest 2 View  Result Date: 02/16/2016 CLINICAL DATA:  Fever, pain EXAM: CHEST  2 VIEW COMPARISON:  07/15/2015 FINDINGS: Cardiomediastinal silhouette is stable. No infiltrate or pleural effusion. No pulmonary edema. Stable mild perihilar chronic bronchitic changes. Atherosclerotic calcifications of thoracic aorta. Mild degenerative changes thoracic spine. IMPRESSION: No active disease.  Stable perihilar chronic bronchitic changes. Electronically Signed   By: Lahoma Crocker M.D.   On: 02/16/2016 14:38    Procedures Procedures (including critical care time)  Medications Ordered in ED Medications  cefTRIAXone (ROCEPHIN) 1 g in dextrose 5 % 50 mL IVPB (1 g Intravenous New Bag/Given 02/16/16 1557)  sodium chloride 0.9 % bolus 1,000 mL (0 mLs Intravenous Stopped 02/16/16 1558)  acetaminophen (TYLENOL) tablet 1,000 mg (1,000 mg Oral Given 02/16/16 1342)  ondansetron (ZOFRAN) injection 4 mg (4 mg Intravenous Given 02/16/16 1342)  sodium chloride 0.9 % bolus 1,000 mL (1,000 mLs Intravenous New Bag/Given 02/16/16 1557)     Initial Impression / Assessment and Plan / ED Course  I have reviewed the triage vital signs and the nursing notes.  Pertinent labs & imaging results that were available during my care of the patient were reviewed by me and considered in my medical decision making (see chart for details).  Clinical Course   Pt still confused, but looks a little better.  He was given IV abx and IVFs.  The pt does not meet sepsis criteria.  He was d/w unassigned, as his pcp is  in [place], for admission.  Pt d/w [facility] who will admit pt.   Final Clinical Impressions(s) / ED Diagnoses   Final diagnoses:  Acute cystitis without hematuria  Acute delirium  Fever, unspecified fever cause  AKI (acute kidney injury) ([place])  Hyperglycemia    New Prescriptions New Prescriptions   No medications on file     Isla Pence, MD 02/16/16 [phone]

## 2016-02-16 NOTE — ED Triage Notes (Signed)
Daughter in law stated, his daughter said his B P was high, he was shaking and has some stomach pain at church.

## 2016-02-16 NOTE — Progress Notes (Signed)
CRITICAL VALUE ALERT  Critical value received:  Lactic acid of 2.6  Date of notification:  02/16/16  Time of notification:  11:41pm  Critical value read back:Yes.    Nurse who received alert:  Raphael Gibney  MD notified (1st page):  Schorr, NP  Time of first page:  11:53pm  MD notified (2nd page): Schorr, NP  Time of second page: 12:18am ; 02/17/16  Responding MD:  Hilbert Bible, NP  Time MD responded:  12:18am

## 2016-02-16 NOTE — H&P (Signed)
History and Physical    Christianjames Soule SWF:[id number] DOB: 29-Apr-1927 DOA: 02/16/2016   PCP: Pcp Not In System   Patient coming from:  Home   Chief Complaint: confusion, chills, abdominal pain   HPI: Jose Kennedy is a 82 y.o. male with a history of CKD 3, C DHF, DM, Anemia, history of CVA in 1991, brought to the ED by his family due to increased confusion. He was at church when he began experiencing chills, abdominal pain, nausea and vomiting. At the time of presentation he was afebrile and after IV fluids, his mental status improved. Denies mucositis. Denies any respiratory complaints. Denies any chest pain or palpitations. Denies lower extremity swelling. Denies any dysuria but is chronically incontinent of urine. On presentation there is a strong urine odor in the room. Denies abnormal skin rashes, or neuropathy. Denies any bleeding issues such as epistaxis, hematemesis, hematuria or hematochezia. No syncope or presyncope. No seizures. No falls. No recent infections or changes in meds and he is compliant with regimen. No ETOH   ED Course:  BP 109/60   Pulse 80   Temp 100.4 F (38 C) (Oral)   Resp 15   Ht _0  (1.803 m)   Wt 95.3 kg (210 lb)   SpO2 98%   BMI 29.29 kg/m    Received Rocephin 1 g x1 and  IVF  Received 2 L IV NS  Zofran ASA and Tylenol CT A/P no acute findings.  Cr 1.25 ( BL 1) EGFR 58 Glu 183, AGap 9  T bili 1.1 (dehydration)  Lactic acid 3.12  WBC 4.3, HB 12.4  EKG SR without ACS  UA trace leukocytes  Recent Labs Lab 02/16/16 1241  AST 521*  ALT 349*  ALKPHOS 135*  BILITOT 1.1  PROT 6.0*  ALBUMIN 3.2*   Review of Systems: As per HPI otherwise 10 point review of systems negative.   Past Medical History:  Diagnosis Date  . Coronary artery disease   . Esophageal spasm   . Esophageal ulcer    "was hospitalized 2-3 times for bleeding; no problems in the last couple years" (07/19/2015)  . GERD (gastroesophageal reflux disease)    "severe" (07/19/2015)  .  Heart murmur   . High cholesterol   . History of stomach ulcers    "right as you go into the stomach" (07/19/2015)  . Hypertension   . Laryngeal dystonia    chronic, appears as it he's dry heaving  . Stroke [facility]) 1991   "gait is a little different since" (07/19/2015)  . Type II diabetes mellitus (HCC)    insulin dependent    Past Surgical History:  Procedure Laterality Date  . APPENDECTOMY    . BACK SURGERY    . CATARACT EXTRACTION W/ INTRAOCULAR LENS IMPLANT Right   . ESOPHAGOGASTRODUODENOSCOPY  08/26/2011   Procedure: ESOPHAGOGASTRODUODENOSCOPY (EGD);  Surgeon: Wonda Horner, MD;  Location: [facility] ENDOSCOPY;  Service: Endoscopy;  Laterality: N/A;  . ESOPHAGOGASTRODUODENOSCOPY N/A 09/20/2013   Procedure: ESOPHAGOGASTRODUODENOSCOPY (EGD);  Surgeon: Jeryl Columbia, MD;  Location: [facility] ENDOSCOPY;  Service: Endoscopy;  Laterality: N/A;  . LAPAROSCOPIC CHOLECYSTECTOMY    . LARYNX SURGERY     nodule  . LUMBAR DISC SURGERY      Social History Social History   Social History  . Marital status: Widowed    Spouse name: N/A  . Number of children: N/A  . Years of education: N/A   Occupational History  . Not on file.   Social History Main  Topics  . Smoking status: Former Smoker    Packs/day: 0.00    Types: Pipe, Cigars  . Smokeless tobacco: Former Systems developer    Types: Chew     Comment: "quit smoking in the 1970s"  . Alcohol use Yes     Comment: 07/19/2015 "might have a couple drinks a couple times/year"  . Drug use: No  . Sexual activity: Not on file   Other Topics Concern  . Not on file   Social History Narrative   Lives with daughter     No Known Allergies  Family History  Problem Relation Age of Onset  . Hypertension Mother   . Stroke Mother   . Hypertension Father   . Stroke Father   . Hypertension Sister   . Diabetes type II Sister   . Hypertension Brother   . Cancer Neg Hx   . CAD Neg Hx       Prior to Admission medications   Medication Sig Start Date End Date  Taking? Authorizing Provider  amLODipine (NORVASC) 10 MG tablet Take 10 mg by mouth daily.    Historical Provider, MD  aspirin EC 81 MG tablet Take 81 mg by mouth daily.    Historical Provider, MD  carvedilol (COREG) 12.5 MG tablet Take 1 tablet (12.5 mg total) by mouth 2 (two) times daily with a meal. 07/30/13   Orson Eva, MD  docusate sodium (COLACE) 100 MG capsule Take 100 mg by mouth 2 (two) times daily.    Historical Provider, MD  ferrous sulfate 325 (65 FE) MG tablet Take 325 mg by mouth daily with breakfast.     Historical Provider, MD  hydrALAZINE (APRESOLINE) 10 MG tablet Take 10 mg by mouth 3 (three) times daily.    Historical Provider, MD  insulin NPH-insulin regular (NOVOLIN 70/30) (70-30) 100 UNIT/ML injection Inject 10 Units into the skin 2 (two) times daily with a meal.     Historical Provider, MD  losartan (COZAAR) 100 MG tablet Take 100 mg by mouth daily.    Historical Provider, MD  Omeprazole-Sodium Bicarbonate (ZEGERID) 20-1100 MG CAPS capsule Take 1 capsule by mouth daily before breakfast.    Historical Provider, MD  pravastatin (PRAVACHOL) 80 MG tablet Take 40 mg by mouth at bedtime.  04/26/15   Historical Provider, MD  sucralfate (CARAFATE) 1 G tablet Take 1 g by mouth 4 (four) times daily.    Historical Provider, MD    Physical Exam:    Vitals:   02/16/16 1330 02/16/16 1400 02/16/16 1600 02/16/16 1616  BP:  123/91 109/60   Pulse: 85 78 80   Resp: _0 Temp:    100.4 F (38 C)  TempSrc:    Oral  SpO2: 97% 93% 98%   Weight:      Height:           Constitutional: NAD, calm, comfortable  Vitals:   02/16/16 1330 02/16/16 1400 02/16/16 1600 02/16/16 1616  BP:  123/91 109/60   Pulse: 85 78 80   Resp: _1 Temp:    100.4 F (38 C)  TempSrc:    Oral  SpO2: 97% 93% 98%   Weight:      Height:       Eyes: PERRL, lids and conjunctivae normal ENMT: Mucous membranes are dry Posterior pharynx clear of any exudate or lesions.Normal dentition.  Neck:  normal, supple, no masses, no thyromegaly Respiratory: clear to auscultation bilaterally, no wheezing, no crackles. Normal  respiratory effort. No accessory muscle use.  Cardiovascular: Regular rate and rhythm, no murmurs / rubs / gallops. No extremity edema. 2+ pedal pulses. No carotid bruits.  Abdomen: no tenderness, no masses palpated. No hepatosplenomegaly. Bowel sounds positive.  Musculoskeletal: no clubbing / cyanosis. No joint deformity upper and lower extremities. Good ROM, no contractures. Normal muscle tone.  Skin: no rashes, lesions, ulcers.  Neurologic: CN 2-12 grossly intact. Sensation intact, DTR normal. Strength 5/5 in all 4.  Psychiatric: Normal judgment and insight. Alert and oriented x 3. Normal mood.     Labs on Admission: I have personally reviewed following labs and imaging studies  CBC:  Recent Labs Lab 02/16/16 1241  WBC 4.3  HGB 12.4*  HCT 38.9*  MCV 84.2  PLT 330    Basic Metabolic Panel:  Recent Labs Lab 02/16/16 1241  NA 136  K 3.8  CL 96*  CO2 31  GLUCOSE 183*  BUN 26*  CREATININE 1.25*  CALCIUM 9.2    GFR: Estimated Creatinine Clearance: 50 mL/min (by C-G formula based on SCr of 1.25 mg/dL (H)).  Liver Function Tests:  Recent Labs Lab 02/16/16 1241  AST 521*  ALT 349*  ALKPHOS 135*  BILITOT 1.1  PROT 6.0*  ALBUMIN 3.2*    Recent Labs Lab 02/16/16 1241  LIPASE 24   No results for input(s): AMMONIA in the last 168 hours.  Coagulation Profile: No results for input(s): INR, PROTIME in the last 168 hours.  Cardiac Enzymes: No results for input(s): CKTOTAL, CKMB, CKMBINDEX, TROPONINI in the last 168 hours.  BNP (last 3 results) No results for input(s): PROBNP in the last 8760 hours.  HbA1C: No results for input(s): HGBA1C in the last 72 hours.  CBG: No results for input(s): GLUCAP in the last 168 hours.  Lipid Profile: No results for input(s): CHOL, HDL, LDLCALC, TRIG, CHOLHDL, LDLDIRECT in the last 72  hours.  Thyroid Function Tests: No results for input(s): TSH, T4TOTAL, FREET4, T3FREE, THYROIDAB in the last 72 hours.  Anemia Panel: No results for input(s): VITAMINB12, FOLATE, FERRITIN, TIBC, IRON, RETICCTPCT in the last 72 hours.  Urine analysis:    Component Value Date/Time   COLORURINE AMBER (A) 02/16/2016 1455   APPEARANCEUR CLOUDY (A) 02/16/2016 1455   LABSPEC 1.014 02/16/2016 1455   PHURINE 7.0 02/16/2016 1455   GLUCOSEU NEGATIVE 02/16/2016 1455   HGBUR SMALL (A) 02/16/2016 1455   BILIRUBINUR NEGATIVE 02/16/2016 1455   KETONESUR NEGATIVE 02/16/2016 1455   PROTEINUR 30 (A) 02/16/2016 1455   UROBILINOGEN 1.0 09/20/2013 2245   NITRITE NEGATIVE 02/16/2016 1455   LEUKOCYTESUR TRACE (A) 02/16/2016 1455    Sepsis Labs: _0 (procalcitonin:4,lacticidven:4) )No results found for this or any previous visit (from the past 240 hour(s)).   Radiological Exams on Admission: Ct Abdomen Pelvis Wo Contrast  Result Date: 02/16/2016 CLINICAL DATA:  82 year old male with abdominal pain and fever. EXAM: CT ABDOMEN AND PELVIS WITHOUT CONTRAST TECHNIQUE: Multidetector CT imaging of the abdomen and pelvis was performed following the standard protocol without IV contrast. COMPARISON:  CT the abdomen and pelvis 07/15/2015. FINDINGS: Lower chest: Areas of mild cylindrical bronchiectasis and scarring are noted in the lower lobes of the lungs bilaterally. Aortic atherosclerosis in the distal descending thoracic aorta. Small hiatal hernia. Hepatobiliary: No definite cystic or solid hepatic lesions are identified on today's noncontrast CT examination. Status post cholecystectomy. Pancreas: No definite pancreatic mass or peripancreatic inflammatory changes on today's noncontrast CT examination. Spleen: Unremarkable. Adrenals/Urinary Tract: Again noted are numerous low-attenuation lesions scattered  throughout both kidneys, incompletely characterized on today's noncontrast CT examination, but the  majority of these were previously characterized as simple cysts. No hydroureteronephrosis. Urinary bladder is unremarkable in appearance. Bilateral adrenal glands are normal in appearance. Stomach/Bowel: The unenhanced appearance of the stomach is normal. There is no pathologic dilatation of small bowel or colon. There is a short segment of mid to distal small bowel which extends into a small supraumbilical ventral hernia. Several colonic diverticulae are noted, without surrounding inflammatory changes to suggest an acute diverticulitis at this time. Status post appendectomy. Vascular/Lymphatic: Aortic atherosclerosis, without definite aneurysm in the abdominal or pelvic vasculature. No lymphadenopathy noted in the abdomen or pelvis. Reproductive: Prostate gland and seminal vesicles are unremarkable in appearance. Multiple serpiginous structures in the visualized portions of the scrotum bilaterally, suggestive of large varicoceles. Other: No significant volume of ascites.  No pneumoperitoneum. Musculoskeletal: There are no aggressive appearing lytic or blastic lesions noted in the visualized portions of the skeleton. IMPRESSION: 1. No acute findings to account for the patient's symptoms. 2. Mild colonic diverticulosis without evidence of acute diverticulitis at this time. 3. Small supraumbilical ventral hernia containing a short segment of mid to distal small bowel, without evidence of incarceration or obstruction at this time. 4. Aortic atherosclerosis. 5. Multiple large low-attenuation lesions in the kidneys bilaterally, similar to the prior study, previously characterized as cysts. 6. Additional incidental findings, as above. Electronically Signed   By: Vinnie Langton M.D.   On: 02/16/2016 15:40   Dg Chest 2 View  Result Date: 02/16/2016 CLINICAL DATA:  Fever, pain EXAM: CHEST  2 VIEW COMPARISON:  07/15/2015 FINDINGS: Cardiomediastinal silhouette is stable. No infiltrate or pleural effusion. No pulmonary  edema. Stable mild perihilar chronic bronchitic changes. Atherosclerotic calcifications of thoracic aorta. Mild degenerative changes thoracic spine. IMPRESSION: No active disease.  Stable perihilar chronic bronchitic changes. Electronically Signed   By: Lahoma Crocker M.D.   On: 02/16/2016 14:38    EKG: Independently reviewed.  Assessment/Plan Active Problems:   Acute confusion   UTI (urinary tract infection)   Coronary artery disease   CKD (chronic kidney disease) stage 3, GFR 30-59 ml/min   Acute confusional state of metabolic origin   Diabetes mellitus with diabetic nephropathy (HCC)   Anemia of chronic disease   Acute encephalopathy   Chronic diastolic CHF (congestive heart failure) ([place])   Delirium     Acute encephalopathy. Etiology unclear. Concern UTI vs metabolic encephalopathy. His overall mental status has improved after IVF 2 liters and Rocephin. U Cx pending. Tmax 100.4 improving now.  WBC normal . Liver functions elevated with AST 521/ALT 349, Alk Phos 135. Bili 1.1.  Lactic Acid 3.12 CT abd without acute changes to explain abnormalities.  Admit to medsurg obs  Vitamin B-12 RPR ammonia level and TSH UDS IVF Serial lactic acid Continue Rocephin  Repeat CMET in am  OT/PT   Chronic kidney disease stage 3  baseline creatinine 1   Current Cr 1.25 in the setting of dehydration  Lab Results  Component Value Date   CREATININE 1.25 (H) 02/16/2016   CREATININE 0.96 07/21/2015   CREATININE 0.95 07/21/2015   IVF Continue Rocephin Repeat CMET in am  Type II Diabetes Current blood sugar level is 183 Lab Results  Component Value Date   HGBA1C 7.1 (H) 07/20/2015   Hgb A1C Hold home oral diabetic medications.  SSI Heart healthy carb modified diet.  Anemia of chronic disease Hemoglobin 12, at baseline   Repeat CBC in am No  transfusion indicated at this time Continue Fe sulfate  Dysphagia, chronic Continue Dysphagia 3 diet and carafate     Hypertension BP 109/60    Pulse 80    Controlled Continue home anti-hypertensive medications   Hyperlipidemia Continue home statins    DVT prophylaxis:  Heparin  Code Status:   Full   Family Communication:  Discussed with patient  Disposition Plan: Expect patient to be discharged to home after condition improves Consults called:    None Admission status:  Medsurg  Obs   Yannis Gumbs E, PA-C [facility]   If 7PM-7AM, please contact night-coverage [email or web address] Password [facility]  02/16/2016, 4:34 PM

## 2016-02-17 DIAGNOSIS — I130 Hypertensive heart and chronic kidney disease with heart failure and stage 1 through stage 4 chronic kidney disease, or unspecified chronic kidney disease: Secondary | ICD-10-CM | POA: Diagnosis present

## 2016-02-17 DIAGNOSIS — J449 Chronic obstructive pulmonary disease, unspecified: Secondary | ICD-10-CM | POA: Diagnosis present

## 2016-02-17 DIAGNOSIS — Z9841 Cataract extraction status, right eye: Secondary | ICD-10-CM | POA: Diagnosis not present

## 2016-02-17 DIAGNOSIS — I5032 Chronic diastolic (congestive) heart failure: Secondary | ICD-10-CM | POA: Diagnosis present

## 2016-02-17 DIAGNOSIS — E1121 Type 2 diabetes mellitus with diabetic nephropathy: Secondary | ICD-10-CM | POA: Diagnosis present

## 2016-02-17 DIAGNOSIS — E1122 Type 2 diabetes mellitus with diabetic chronic kidney disease: Secondary | ICD-10-CM | POA: Diagnosis present

## 2016-02-17 DIAGNOSIS — E1165 Type 2 diabetes mellitus with hyperglycemia: Secondary | ICD-10-CM | POA: Diagnosis present

## 2016-02-17 DIAGNOSIS — R740 Nonspecific elevation of levels of transaminase and lactic acid dehydrogenase [LDH]: Secondary | ICD-10-CM | POA: Diagnosis not present

## 2016-02-17 DIAGNOSIS — R7881 Bacteremia: Secondary | ICD-10-CM | POA: Diagnosis not present

## 2016-02-17 DIAGNOSIS — K219 Gastro-esophageal reflux disease without esophagitis: Secondary | ICD-10-CM | POA: Diagnosis present

## 2016-02-17 DIAGNOSIS — I493 Ventricular premature depolarization: Secondary | ICD-10-CM | POA: Diagnosis not present

## 2016-02-17 DIAGNOSIS — R410 Disorientation, unspecified: Secondary | ICD-10-CM | POA: Diagnosis not present

## 2016-02-17 DIAGNOSIS — K224 Dyskinesia of esophagus: Secondary | ICD-10-CM | POA: Diagnosis present

## 2016-02-17 DIAGNOSIS — E0821 Diabetes mellitus due to underlying condition with diabetic nephropathy: Secondary | ICD-10-CM

## 2016-02-17 DIAGNOSIS — D638 Anemia in other chronic diseases classified elsewhere: Secondary | ICD-10-CM | POA: Diagnosis not present

## 2016-02-17 DIAGNOSIS — Z961 Presence of intraocular lens: Secondary | ICD-10-CM | POA: Diagnosis present

## 2016-02-17 DIAGNOSIS — N179 Acute kidney failure, unspecified: Secondary | ICD-10-CM | POA: Diagnosis present

## 2016-02-17 DIAGNOSIS — F05 Delirium due to known physiological condition: Secondary | ICD-10-CM | POA: Diagnosis present

## 2016-02-17 DIAGNOSIS — N390 Urinary tract infection, site not specified: Secondary | ICD-10-CM | POA: Diagnosis not present

## 2016-02-17 DIAGNOSIS — R001 Bradycardia, unspecified: Secondary | ICD-10-CM | POA: Diagnosis not present

## 2016-02-17 DIAGNOSIS — G9340 Encephalopathy, unspecified: Secondary | ICD-10-CM | POA: Diagnosis present

## 2016-02-17 DIAGNOSIS — N183 Chronic kidney disease, stage 3 (moderate): Secondary | ICD-10-CM | POA: Diagnosis present

## 2016-02-17 DIAGNOSIS — R1310 Dysphagia, unspecified: Secondary | ICD-10-CM | POA: Diagnosis present

## 2016-02-17 DIAGNOSIS — Z8673 Personal history of transient ischemic attack (TIA), and cerebral infarction without residual deficits: Secondary | ICD-10-CM | POA: Diagnosis not present

## 2016-02-17 DIAGNOSIS — I2510 Atherosclerotic heart disease of native coronary artery without angina pectoris: Secondary | ICD-10-CM | POA: Diagnosis present

## 2016-02-17 DIAGNOSIS — E860 Dehydration: Secondary | ICD-10-CM | POA: Diagnosis present

## 2016-02-17 DIAGNOSIS — N3000 Acute cystitis without hematuria: Secondary | ICD-10-CM | POA: Diagnosis present

## 2016-02-17 DIAGNOSIS — Z23 Encounter for immunization: Secondary | ICD-10-CM | POA: Diagnosis not present

## 2016-02-17 DIAGNOSIS — Z794 Long term (current) use of insulin: Secondary | ICD-10-CM

## 2016-02-17 DIAGNOSIS — E785 Hyperlipidemia, unspecified: Secondary | ICD-10-CM | POA: Diagnosis present

## 2016-02-17 LAB — RAPID URINE DRUG SCREEN, HOSP PERFORMED
Amphetamines: NOT DETECTED
BARBITURATES: NOT DETECTED
BENZODIAZEPINES: NOT DETECTED
COCAINE: NOT DETECTED
OPIATES: NOT DETECTED
Tetrahydrocannabinol: NOT DETECTED

## 2016-02-17 LAB — GLUCOSE, CAPILLARY
GLUCOSE-CAPILLARY: 128 mg/dL — AB (ref 65–99)
GLUCOSE-CAPILLARY: 164 mg/dL — AB (ref 65–99)
GLUCOSE-CAPILLARY: 173 mg/dL — AB (ref 65–99)
GLUCOSE-CAPILLARY: 264 mg/dL — AB (ref 65–99)
Glucose-Capillary: 107 mg/dL — ABNORMAL HIGH (ref 65–99)

## 2016-02-17 LAB — BLOOD CULTURE ID PANEL (REFLEXED)
Acinetobacter baumannii: NOT DETECTED
CANDIDA KRUSEI: NOT DETECTED
CANDIDA PARAPSILOSIS: NOT DETECTED
CARBAPENEM RESISTANCE: NOT DETECTED
Candida albicans: NOT DETECTED
Candida glabrata: NOT DETECTED
Candida tropicalis: NOT DETECTED
ENTEROBACTERIACEAE SPECIES: DETECTED — AB
ENTEROCOCCUS SPECIES: NOT DETECTED
Enterobacter cloacae complex: NOT DETECTED
Escherichia coli: NOT DETECTED
HAEMOPHILUS INFLUENZAE: NOT DETECTED
KLEBSIELLA OXYTOCA: NOT DETECTED
Klebsiella pneumoniae: DETECTED — AB
LISTERIA MONOCYTOGENES: NOT DETECTED
Neisseria meningitidis: NOT DETECTED
PSEUDOMONAS AERUGINOSA: NOT DETECTED
Proteus species: NOT DETECTED
SERRATIA MARCESCENS: NOT DETECTED
STAPHYLOCOCCUS AUREUS BCID: NOT DETECTED
STAPHYLOCOCCUS SPECIES: NOT DETECTED
STREPTOCOCCUS PNEUMONIAE: NOT DETECTED
STREPTOCOCCUS PYOGENES: NOT DETECTED
STREPTOCOCCUS SPECIES: NOT DETECTED
Streptococcus agalactiae: NOT DETECTED

## 2016-02-17 LAB — CBC
HCT: 33.2 % — ABNORMAL LOW (ref 39.0–52.0)
HEMOGLOBIN: 10.5 g/dL — AB (ref 13.0–17.0)
MCH: 26.8 pg (ref 26.0–34.0)
MCHC: 31.6 g/dL (ref 30.0–36.0)
MCV: 84.7 fL (ref 78.0–100.0)
Platelets: 152 10*3/uL (ref 150–400)
RBC: 3.92 MIL/uL — AB (ref 4.22–5.81)
RDW: 14.4 % (ref 11.5–15.5)
WBC: 16.8 10*3/uL — AB (ref 4.0–10.5)

## 2016-02-17 LAB — LACTIC ACID, PLASMA
Lactic Acid, Venous: 2.2 mmol/L (ref 0.5–1.9)
Lactic Acid, Venous: 1.2 mmol/L (ref 0.5–1.9)

## 2016-02-17 LAB — COMPREHENSIVE METABOLIC PANEL
ALT: 241 U/L — ABNORMAL HIGH (ref 17–63)
ANION GAP: 10 (ref 5–15)
AST: 191 U/L — ABNORMAL HIGH (ref 15–41)
Albumin: 2.5 g/dL — ABNORMAL LOW (ref 3.5–5.0)
Alkaline Phosphatase: 85 U/L (ref 38–126)
BUN: 31 mg/dL — ABNORMAL HIGH (ref 6–20)
CALCIUM: 7.9 mg/dL — AB (ref 8.9–10.3)
CHLORIDE: 101 mmol/L (ref 101–111)
CO2: 27 mmol/L (ref 22–32)
Creatinine, Ser: 1.47 mg/dL — ABNORMAL HIGH (ref 0.61–1.24)
GFR, EST AFRICAN AMERICAN: 48 mL/min — AB (ref >60)
GFR, EST NON AFRICAN AMERICAN: 41 mL/min — AB (ref >60)
Glucose, Bld: 157 mg/dL — ABNORMAL HIGH (ref 65–99)
Potassium: 3.5 mmol/L (ref 3.5–5.1)
SODIUM: 138 mmol/L (ref 135–145)
Total Bilirubin: 0.5 mg/dL (ref 0.3–1.2)
Total Protein: 4.7 g/dL — ABNORMAL LOW (ref 6.5–8.1)

## 2016-02-17 LAB — HEMOGLOBIN A1C
HEMOGLOBIN A1C: 6.5 % — AB (ref 4.8–5.6)
Mean Plasma Glucose: 140 mg/dL

## 2016-02-17 MED ORDER — AMLODIPINE BESYLATE 5 MG PO TABS
5.0000 mg | ORAL_TABLET | Freq: Every day | ORAL | Status: DC
Start: 2016-02-18 — End: 2016-02-21
  Administered 2016-02-18 – 2016-02-21 (×4): 5 mg via ORAL
  Filled 2016-02-17 (×4): qty 1

## 2016-02-17 MED ORDER — VANCOMYCIN HCL IN DEXTROSE 1-5 GM/200ML-% IV SOLN
1000.0000 mg | INTRAVENOUS | Status: DC
  Administered 2016-02-17 – 2016-02-18 (×2): 1000 mg via INTRAVENOUS
  Filled 2016-02-17 (×3): qty 200

## 2016-02-17 MED ORDER — SODIUM CHLORIDE 0.9 % IV BOLUS (SEPSIS)
500.0000 mL | Freq: Once | INTRAVENOUS | Status: AC
  Administered 2016-02-17: 500 mL via INTRAVENOUS

## 2016-02-17 MED ORDER — DEXTROSE 5 % IV SOLN
2.0000 g | INTRAVENOUS | Status: DC
  Administered 2016-02-17 – 2016-02-19 (×3): 2 g via INTRAVENOUS
  Filled 2016-02-17 (×3): qty 2

## 2016-02-17 NOTE — Care Management Note (Addendum)
Case Management Note  Patient Details  Name: Jose Kennedy MRN: [id number] Date of Birth: 07/07/1927  Subjective/Objective:                 Patient from home, admitted with confusion in setting of UTI, mentation clearer today, continues IV Rocephin. PT eval pending, had recent fall at home.    Action/Plan:  Will continue to follow for [facility] needs. Provided DIL at bedside with list of [facility] providers to choose from.  Expected Discharge Date:                  Expected Discharge Plan:     In-House Referral:  NA  Discharge planning Services  CM Consult  Post Acute Care Choice:    Choice offered to:     DME Arranged:    DME Agency:     HH Arranged:    HH Agency:     Status of Service:  In process, will continue to follow  If discussed at Long Length of Stay Meetings, dates discussed:    Additional Comments:  Carles Collet, RN 02/17/2016, 1:53 PM

## 2016-02-17 NOTE — Progress Notes (Signed)
PROGRESS NOTE    Jose Kennedy  [id number] DOB: [birth year] DOA: 02/16/2016 PCP: Pcp Not In System   Outpatient Specialists:     Brief Narrative:  Jose Kennedy is a 82 y.o. male with a Past Medical History of COPD, chronic diastolic heart failure, diabetes, anemia, history of CVA in 1991 by into the ED by side for increased confusion with subjective chills, abdominal pain, nausea, vomiting, found to have likely urinary tract infection.   Assessment & Plan:   Active Problems:   Coronary artery disease   CKD (chronic kidney disease) stage 3, GFR 30-59 ml/min   Acute confusional state of metabolic origin   Diabetes mellitus with diabetic nephropathy (HCC)   Anemia of chronic disease   Acute confusion   Acute encephalopathy   Chronic diastolic CHF (congestive heart failure) (HCC)   Delirium   UTI (urinary tract infection)   Acute encephalopathy due to UTI/bacteremia Vitamin B-12  -RPR  -ammonia level ok - TSH low- add ft4 -Rocephin -- increase to 2mg  OT/PT   AKI on Chronic kidney disease stage 3  baseline creatinine 1   -IVF -bladder scan Continue Rocephin  Elevated liver enzymes -due to low BP from sepsis-- decreasing  Type II Diabetes Hold home oral diabetic medications.  SSI Heart healthy/carb modified diet.  Anemia of chronic disease  -baseline 10  Dysphagia, chronic Continue Dysphagia 3 diet  Hypertension BP 109/60   Pulse 80    Controlled Continue home anti-hypertensive medications   Hyperlipidemia Continue home statins    DVT prophylaxis:  SQ Heparin  Code Status: Full Code   Family Communication: Son at bedside  Disposition Plan:  Suspect meets inpt criteria-- PT/OT eval -will need repeat Blood cultures in AM and to await sensitivites of blood cultures   Consultants:     Procedures:       Subjective: Feeling better Hungry-- waiting on breakfast to be brought up   Objective: Vitals:   02/16/16 1819 02/16/16  2113 02/17/16 0339 02/17/16 0530  BP: (!) 111/55 118/60  115/62  Pulse: 80 80  78  Resp: 18 17  17   Temp: (!) 102 F (38.9 C) 98.6 F (37 C)  99.2 F (37.3 C)  TempSrc: Oral Oral  Oral  SpO2: 97% 99%  97%  Weight: 87.1 kg (192 lb 0.3 oz)  86.7 kg (191 lb 2.2 oz)   Height: 5\' 11"  (1.803 m)       Intake/Output Summary (Last 24 hours) at 02/17/16 1102 Last data filed at 02/17/16 0536  Gross per 24 hour  Intake              820 ml  Output              875 ml  Net              -55 ml   Filed Weights   02/16/16 1238 02/16/16 1819 02/17/16 0339  Weight: 95.3 kg (210 lb) 87.1 kg (192 lb 0.3 oz) 86.7 kg (191 lb 2.2 oz)    Examination:  General exam: Appears calm and comfortable  Respiratory system: Clear to auscultation. Respiratory effort normal. Cardiovascular system: S1 & S2 heard, RRR. No JVD, murmurs, rubs, gallops or clicks. No pedal edema. Gastrointestinal system: Abdomen is nondistended, soft and nontender. No organomegaly or masses felt. Normal bowel sounds heard. Central nervous system: Alert and oriented. No focal neurological deficits.     Data Reviewed: I have personally reviewed following labs and imaging studies  CBC:  Recent Labs Lab 02/16/16 1241 02/17/16 0543  WBC 4.3 16.8*  HGB 12.4* 10.5*  HCT 38.9* 33.2*  MCV 84.2 84.7  PLT 193 0000000   Basic Metabolic Panel:  Recent Labs Lab 02/16/16 1241 02/17/16 0543  NA 136 138  K 3.8 3.5  CL 96* 101  CO2 31 27  GLUCOSE 183* 157*  BUN 26* 31*  CREATININE 1.25* 1.47*  CALCIUM 9.2 7.9*   GFR: Estimated Creatinine Clearance: 38.4 mL/min (by C-G formula based on SCr of 1.47 mg/dL (H)). Liver Function Tests:  Recent Labs Lab 02/16/16 1241 02/17/16 0543  AST 521* 191*  ALT 349* 241*  ALKPHOS 135* 85  BILITOT 1.1 0.5  PROT 6.0* 4.7*  ALBUMIN 3.2* 2.5*    Recent Labs Lab 02/16/16 1241  LIPASE 24    Recent Labs Lab 02/16/16 1724  AMMONIA 33   Coagulation Profile: No results for  input(s): INR, PROTIME in the last 168 hours. Cardiac Enzymes: No results for input(s): CKTOTAL, CKMB, CKMBINDEX, TROPONINI in the last 168 hours. BNP (last 3 results) No results for input(s): PROBNP in the last 8760 hours. HbA1C: No results for input(s): HGBA1C in the last 72 hours. CBG:  Recent Labs Lab 02/17/16 0005 02/17/16 0804  GLUCAP 264* 128*   Lipid Profile: No results for input(s): CHOL, HDL, LDLCALC, TRIG, CHOLHDL, LDLDIRECT in the last 72 hours. Thyroid Function Tests:  Recent Labs  02/16/16 1724  TSH 0.325*   Anemia Panel:  Recent Labs  02/16/16 1724  VITAMINB12 761   Urine analysis:    Component Value Date/Time   COLORURINE AMBER (A) 02/16/2016 1455   APPEARANCEUR CLOUDY (A) 02/16/2016 1455   LABSPEC 1.014 02/16/2016 1455   PHURINE 7.0 02/16/2016 1455   GLUCOSEU NEGATIVE 02/16/2016 1455   HGBUR SMALL (A) 02/16/2016 1455   BILIRUBINUR NEGATIVE 02/16/2016 1455   KETONESUR NEGATIVE 02/16/2016 1455   PROTEINUR 30 (A) 02/16/2016 1455   UROBILINOGEN 1.0 09/20/2013 2245   NITRITE NEGATIVE 02/16/2016 1455   LEUKOCYTESUR TRACE (A) 02/16/2016 1455      Recent Results (from the past 240 hour(s))  Blood culture (routine x 2)     Status: None (Preliminary result)   Collection Time: 02/16/16  1:34 PM  Result Value Ref Range Status   Specimen Description BLOOD RIGHT ANTECUBITAL  Final   Special Requests BOTTLES DRAWN AEROBIC AND ANAEROBIC 5CC  Final   Culture  Setup Time   Final    GRAM NEGATIVE RODS IN BOTH AEROBIC AND ANAEROBIC BOTTLES CRITICAL RESULT CALLED TO, READ BACK BY AND VERIFIED WITH: CARON AMEND,PHARMD @0701  02/17/16 MKELLY,MLT    Culture PENDING  Incomplete   Report Status PENDING  Incomplete  Blood culture (routine x 2)     Status: None (Preliminary result)   Collection Time: 02/16/16  1:50 PM  Result Value Ref Range Status   Specimen Description BLOOD RIGHT HAND  Final   Special Requests BOTTLES DRAWN AEROBIC ONLY 6CC  Final   Culture   Setup Time   Final    GRAM NEGATIVE RODS AEROBIC BOTTLE ONLY Organism ID to follow CRITICAL RESULT CALLED TO, READ BACK BY AND VERIFIED WITH: CARON AMEND,PHARMD @0701  02/17/16 MKELLY,MLT    Culture PENDING  Incomplete   Report Status PENDING  Incomplete  Blood Culture ID Panel (Reflexed)     Status: Abnormal   Collection Time: 02/16/16  1:50 PM  Result Value Ref Range Status   Enterococcus species NOT DETECTED NOT DETECTED Final   Listeria monocytogenes NOT DETECTED NOT  DETECTED Final   Staphylococcus species NOT DETECTED NOT DETECTED Final   Staphylococcus aureus NOT DETECTED NOT DETECTED Final   Streptococcus species NOT DETECTED NOT DETECTED Final   Streptococcus agalactiae NOT DETECTED NOT DETECTED Final   Streptococcus pneumoniae NOT DETECTED NOT DETECTED Final   Streptococcus pyogenes NOT DETECTED NOT DETECTED Final   Acinetobacter baumannii NOT DETECTED NOT DETECTED Final   Enterobacteriaceae species DETECTED (A) NOT DETECTED Final    Comment: CRITICAL RESULT CALLED TO, READ BACK BY AND VERIFIED WITH: CARON AMEND,PHARMD @0701  02/17/16 MKELLY,MLT    Enterobacter cloacae complex NOT DETECTED NOT DETECTED Final   Escherichia coli NOT DETECTED NOT DETECTED Final   Klebsiella oxytoca NOT DETECTED NOT DETECTED Final   Klebsiella pneumoniae DETECTED (A) NOT DETECTED Final    Comment: CRITICAL RESULT CALLED TO, READ BACK BY AND VERIFIED WITH: CARON AMEND,PHARMD @0701  02/17/16 MKELLY,MLT    Proteus species NOT DETECTED NOT DETECTED Final   Serratia marcescens NOT DETECTED NOT DETECTED Final   Carbapenem resistance NOT DETECTED NOT DETECTED Final   Haemophilus influenzae NOT DETECTED NOT DETECTED Final   Neisseria meningitidis NOT DETECTED NOT DETECTED Final   Pseudomonas aeruginosa NOT DETECTED NOT DETECTED Final   Candida albicans NOT DETECTED NOT DETECTED Final   Candida glabrata NOT DETECTED NOT DETECTED Final   Candida krusei NOT DETECTED NOT DETECTED Final   Candida  parapsilosis NOT DETECTED NOT DETECTED Final   Candida tropicalis NOT DETECTED NOT DETECTED Final      Anti-infectives    Start     Dose/Rate Route Frequency Ordered Stop   02/17/16 1600  cefTRIAXone (ROCEPHIN) 1 g in dextrose 5 % 50 mL IVPB  Status:  Discontinued     1 g 100 mL/hr over 30 Minutes Intravenous Every 24 hours 02/16/16 1732 02/17/16 0730   02/17/16 0800  cefTRIAXone (ROCEPHIN) 2 g in dextrose 5 % 50 mL IVPB     2 g 100 mL/hr over 30 Minutes Intravenous Every 24 hours 02/17/16 0730     02/16/16 1600  cefTRIAXone (ROCEPHIN) 1 g in dextrose 5 % 50 mL IVPB     1 g 100 mL/hr over 30 Minutes Intravenous  Once 02/16/16 1546 02/16/16 1627       Radiology Studies: Ct Abdomen Pelvis Wo Contrast  Result Date: 02/16/2016 CLINICAL DATA:  82 year old male with abdominal pain and fever. EXAM: CT ABDOMEN AND PELVIS WITHOUT CONTRAST TECHNIQUE: Multidetector CT imaging of the abdomen and pelvis was performed following the standard protocol without IV contrast. COMPARISON:  CT the abdomen and pelvis 07/15/2015. FINDINGS: Lower chest: Areas of mild cylindrical bronchiectasis and scarring are noted in the lower lobes of the lungs bilaterally. Aortic atherosclerosis in the distal descending thoracic aorta. Small hiatal hernia. Hepatobiliary: No definite cystic or solid hepatic lesions are identified on today's noncontrast CT examination. Status post cholecystectomy. Pancreas: No definite pancreatic mass or peripancreatic inflammatory changes on today's noncontrast CT examination. Spleen: Unremarkable. Adrenals/Urinary Tract: Again noted are numerous low-attenuation lesions scattered throughout both kidneys, incompletely characterized on today's noncontrast CT examination, but the majority of these were previously characterized as simple cysts. No hydroureteronephrosis. Urinary bladder is unremarkable in appearance. Bilateral adrenal glands are normal in appearance. Stomach/Bowel: The unenhanced  appearance of the stomach is normal. There is no pathologic dilatation of small bowel or colon. There is a short segment of mid to distal small bowel which extends into a small supraumbilical ventral hernia. Several colonic diverticulae are noted, without surrounding inflammatory changes to suggest an acute  diverticulitis at this time. Status post appendectomy. Vascular/Lymphatic: Aortic atherosclerosis, without definite aneurysm in the abdominal or pelvic vasculature. No lymphadenopathy noted in the abdomen or pelvis. Reproductive: Prostate gland and seminal vesicles are unremarkable in appearance. Multiple serpiginous structures in the visualized portions of the scrotum bilaterally, suggestive of large varicoceles. Other: No significant volume of ascites.  No pneumoperitoneum. Musculoskeletal: There are no aggressive appearing lytic or blastic lesions noted in the visualized portions of the skeleton. IMPRESSION: 1. No acute findings to account for the patient's symptoms. 2. Mild colonic diverticulosis without evidence of acute diverticulitis at this time. 3. Small supraumbilical ventral hernia containing a short segment of mid to distal small bowel, without evidence of incarceration or obstruction at this time. 4. Aortic atherosclerosis. 5. Multiple large low-attenuation lesions in the kidneys bilaterally, similar to the prior study, previously characterized as cysts. 6. Additional incidental findings, as above. Electronically Signed   By: Vinnie Langton M.D.   On: 02/16/2016 15:40   Dg Chest 2 View  Result Date: 02/16/2016 CLINICAL DATA:  Fever, pain EXAM: CHEST  2 VIEW COMPARISON:  07/15/2015 FINDINGS: Cardiomediastinal silhouette is stable. No infiltrate or pleural effusion. No pulmonary edema. Stable mild perihilar chronic bronchitic changes. Atherosclerotic calcifications of thoracic aorta. Mild degenerative changes thoracic spine. IMPRESSION: No active disease.  Stable perihilar chronic bronchitic  changes. Electronically Signed   By: Lahoma Crocker M.D.   On: 02/16/2016 14:38        Scheduled Meds: . amLODipine  10 mg Oral Daily  . aspirin EC  81 mg Oral Daily  . carvedilol  12.5 mg Oral BID WC  . cefTRIAXone (ROCEPHIN)  IV  2 g Intravenous Q24H  . docusate sodium  100 mg Oral BID  . ferrous sulfate  325 mg Oral Q breakfast  . heparin  5,000 Units Subcutaneous Q8H  . hydrALAZINE  10 mg Oral TID  . Influenza vac split quadrivalent PF  0.5 mL Intramuscular Tomorrow-1000  . insulin aspart  0-9 Units Subcutaneous TID WC  . pantoprazole  40 mg Oral Daily  . pravastatin  40 mg Oral QHS  . sucralfate  1 g Oral QID   Continuous Infusions: . sodium chloride 100 mL/hr at 02/17/16 0208     LOS: 0 days    Time spent: 25 min    [place], DO [facility] Pager [phone]  If 7PM-7AM, please contact night-coverage [email or web address] Password [email or web address] 02/17/2016, 11:02 AM

## 2016-02-17 NOTE — Progress Notes (Signed)
PHARMACY - PHYSICIAN COMMUNICATION CRITICAL VALUE ALERT - BLOOD CULTURE IDENTIFICATION (BCID)  Results for orders placed or performed during the hospital encounter of 02/16/16  Blood Culture ID Panel (Reflexed) (Collected: 02/16/2016  1:50 PM)  Result Value Ref Range   Enterococcus species NOT DETECTED NOT DETECTED   Listeria monocytogenes NOT DETECTED NOT DETECTED   Staphylococcus species NOT DETECTED NOT DETECTED   Staphylococcus aureus NOT DETECTED NOT DETECTED   Streptococcus species NOT DETECTED NOT DETECTED   Streptococcus agalactiae NOT DETECTED NOT DETECTED   Streptococcus pneumoniae NOT DETECTED NOT DETECTED   Streptococcus pyogenes NOT DETECTED NOT DETECTED   Acinetobacter baumannii NOT DETECTED NOT DETECTED   Enterobacteriaceae species DETECTED (A) NOT DETECTED   Enterobacter cloacae complex NOT DETECTED NOT DETECTED   Escherichia coli NOT DETECTED NOT DETECTED   Klebsiella oxytoca NOT DETECTED NOT DETECTED   Klebsiella pneumoniae DETECTED (A) NOT DETECTED   Proteus species NOT DETECTED NOT DETECTED   Serratia marcescens NOT DETECTED NOT DETECTED   Carbapenem resistance NOT DETECTED NOT DETECTED   Haemophilus influenzae NOT DETECTED NOT DETECTED   Neisseria meningitidis NOT DETECTED NOT DETECTED   Pseudomonas aeruginosa NOT DETECTED NOT DETECTED   Candida albicans NOT DETECTED NOT DETECTED   Candida glabrata NOT DETECTED NOT DETECTED   Candida krusei NOT DETECTED NOT DETECTED   Candida parapsilosis NOT DETECTED NOT DETECTED   Candida tropicalis NOT DETECTED NOT DETECTED    Name of physician (or Provider) Contacted: Dr. Eliseo Squires  Changes to prescribed antibiotics required: Change Rocephin to 2gm IV q24h for bacteremia  Sherlon Handing, PharmD, BCPS Clinical pharmacist, pager [phone] 02/17/2016  7:31 AM

## 2016-02-17 NOTE — Progress Notes (Signed)
CRITICAL VALUE ALERT  Critical value received:  Lacticid 2.2  Date of notification:  02/17/16  Time of notification:  6:38am  Critical value read back:Yes.    Nurse who received alert:  Raphael Gibney  MD notified (1st page):  Schorr, NP  Time of first page:  6:44am  MD notified (2nd page):  Time of second page:  Responding MD: Hilbert Bible, NP  Time MD responded:  6: 48am

## 2016-02-17 NOTE — Care Management Obs Status (Signed)
[place] NOTIFICATION   Patient Details  Name: Rodrigues Cuadras MRN: [id number] Date of Birth: [birth year]   Medicare Observation Status Notification Given:  Yes    Carles Collet, RN 02/17/2016, 11:19 AM

## 2016-02-17 NOTE — Progress Notes (Signed)
Pharmacy Antibiotic Note  Jose Kennedy is a 82 y.o. male admitted on 02/16/2016 with UTI.  Pharmacy has been consulted for vancomycin dosing.  Plan: Vancomycin 1000 mg IV every 24 hours.  Goal trough 15-20 mcg/mL.  Monitor renal function, s/sx of infection  Height: 5\' 11"  (180.3 cm) Weight: 191 lb 2.2 oz (86.7 kg) IBW/kg (Calculated) : 75.3  Temp (24hrs), Avg:99.8 F (37.7 C), Min:98.6 F (37 C), Max:102 F (38.9 C)   Recent Labs Lab 02/16/16 1241  02/16/16 1629 02/16/16 1858 02/16/16 2231 02/17/16 0543 02/17/16 1033  WBC 4.3  --   --   --   --  16.8*  --   CREATININE 1.25*  --   --   --   --  1.47*  --   LATICACIDVEN  --   < > 1.74 1.6 2.6* 2.2* 1.2  < > = values in this interval not displayed.  Estimated Creatinine Clearance: 38.4 mL/min (by C-G formula based on SCr of 1.47 mg/dL (H)).    No Known Allergies  Thank you for allowing pharmacy to be a part of this patient's care.  Jodean Lima Colleen Donahoe 02/17/2016 5:06 PM

## 2016-02-17 NOTE — Progress Notes (Signed)
Inpatient Diabetes Program Recommendations  AACE/ADA: New Consensus Statement on Inpatient Glycemic Control (2015)  Target Ranges:  Prepandial:   less than 140 mg/dL      Peak postprandial:   less than 180 mg/dL (1-2 hours)      Critically ill patients:  140 - 180 mg/dL   Results for Jose Kennedy, Jose Kennedy (MRN [id number]) as of 02/17/2016 12:28  Ref. Range 02/17/2016 00:05 02/17/2016 08:04  Glucose-Capillary Latest Ref Range: 65 - 99 mg/dL 264 (H) 128 (H)  Results for Jose Kennedy, Jose Kennedy (MRN [id number]) as of 02/17/2016 12:28  Ref. Range 07/20/2015 04:43  Hemoglobin A1C Latest Ref Range: 4.8 - 5.6 % 7.1 (H)    Review of Glycemic Control  Diabetes history: DM2 Outpatient Diabetes medications: NPH-Regular Mix 70/30 10 units BID with a meal Current orders for Inpatient glycemic control: Novolog correction 0-9 units [facility]  Inpatient Diabetes Program Recommendations: If CBG trend increases, please consider adding basal coverage of Lantus 5 units BID (reduced basal dosage of 14 units of home NPH dose). Thank you,  Windy Carina, RN, BSN Diabetes Coordinator Inpatient Diabetes Program [phone] (Team Pager)

## 2016-02-17 NOTE — Evaluation (Signed)
Occupational Therapy Evaluation Patient Details Name: Jose Kennedy MRN: [id number] DOB: [birth year] Today's Date: 02/17/2016    History of Present Illness Pt is an 82 yo male admitted with increed confusion, n/v, abdominal pains.  Pt with UTI and encepalopathy.  Pt with PMH of COPD, chronic diastolic heart failure, CVA in 1991.   Clinical Impression   Pt admitted with above diagnosis and has the deficits listed below. Pt would benefit from cont OT to increase independence with basic adls and adl transfers so he will be safe living home with his children. Pt states he is alone during the day.  Feel this pt needs initial 24 hour S/assist when he goes home due to poor balance and poor insight to some of his deficits. If this supervision id unavailable, pt may need SNF.  Feel his family may be able to provide this supervision.    Follow Up Recommendations  Home health OT;Supervision/Assistance - 24 hour    Equipment Recommendations  3 in 1 bedside comode;Other (comment) (3:1 for shower and toilet.)    Recommendations for Other Services       Precautions / Restrictions Precautions Precautions: Fall Precaution Comments: pt reports one fall in last two weeks when he was "exercising" indoors. Restrictions Weight Bearing Restrictions: No      Mobility Bed Mobility Overal bed mobility: Needs Assistance Bed Mobility: Supine to Sit;Sit to Supine     Supine to sit: Min assist Sit to supine: Min assist   General bed mobility comments: Pt required assist to come to full sitting.  Pt kept leaning backward when sitting on EOB until told to move forward  on the bed to get feet to fllor.  Transfers Overall transfer level: Needs assistance Equipment used: Rolling Meals (2 wheeled) Transfers: Sit to/from Stand Sit to Stand: Min assist         General transfer comment: Pt with weight on heels when first up on his feet.    Balance Overall balance assessment: History of Falls;Needs  assistance Sitting-balance support: Bilateral upper extremity supported;Feet supported Sitting balance-Leahy Scale: Fair   Postural control: Posterior lean Standing balance support: Bilateral upper extremity supported;During functional activity Standing balance-Leahy Scale: Poor Standing balance comment: Pt must have outside support to remain standing.                            ADL Overall ADL's : Needs assistance/impaired Eating/Feeding: Set up;Sitting   Grooming: Wash/dry hands;Wash/dry face;Oral care;Standing;Supervision/safety Grooming Details (indicate cue type and reason): pt stood at sink for 5 min while grooming with no LOB. Upper Body Bathing: Set up;Sitting   Lower Body Bathing: Moderate assistance;Sit to/from stand Lower Body Bathing Details (indicate cue type and reason): Pt with slight posterior lean in standing. pt unable to reach feet well sitting on side of bed. Upper Body Dressing : Minimal assistance;Sitting   Lower Body Dressing: Moderate assistance;Sit to/from stand Lower Body Dressing Details (indicate cue type and reason): assist fulling donning socks and shoes. Pt required assist to get pant started over his legs. Toilet Transfer: Minimal assistance;Comfort height toilet;Ambulation;Grab bars;RW Armed forces technical officer Details (indicate cue type and reason): Pt walked to bathroom with rolling [place]. pt states he likes to use cane so pt was unsafe with [place] this am running into things always on the Right. Toileting- Clothing Manipulation and Hygiene: Minimal assistance;Sit to/from stand Toileting - Clothing Manipulation Details (indicate cue type and reason): Pt required min assist to maintain balance  while he pulle underwear back up in standing at commode.     Functional mobility during ADLs: Minimal assistance;Rolling [place] General ADL Comments: Pt with some difficulties completing LE adls due to decreased ability to access his feet.  Pt unsteady on his  feet during adls requiring someone to be with him.     Vision Vision Assessment?: Vision impaired- to be further tested in functional context Additional Comments: Pt appeared to run into items on his R. Pt could not find soap on the wall that was on his Right.   Perception Perception Perception Tested?: No   Praxis Praxis Praxis tested?: Within functional limits    Pertinent Vitals/Pain Pain Assessment: No/denies pain     Hand Dominance Right   Extremity/Trunk Assessment Upper Extremity Assessment Upper Extremity Assessment: Generalized weakness   Lower Extremity Assessment Lower Extremity Assessment: Defer to PT evaluation   Cervical / Trunk Assessment Cervical / Trunk Assessment: Normal   Communication Communication Communication: Expressive difficulties   Cognition Arousal/Alertness: Awake/alert Behavior During Therapy: WFL for tasks assessed/performed Overall Cognitive Status: No family/caregiver present to determine baseline cognitive functioning                     General Comments       Exercises       Shoulder Instructions      Home Living Family/patient expects to be discharged to:: Private residence Living Arrangements: Children Available Help at Discharge: Family;Available PRN/intermittently (pt states he does NOT have 24 hour S.) Type of Home: House Home Access: Stairs to enter [employer] of Steps: 3 Entrance Stairs-Rails: Right Home Layout: One level;Laundry or work area in [place] Shower/Tub: Teacher, early years/pre: Standard Bathroom Accessibility: Yes   Home Equipment: Environmental consultant - 2 wheels;[place] - 4 wheels;Cane - single point   Additional Comments: Pt states he uses can but only outside.      Prior Functioning/Environment Level of Independence: Needs assistance  Gait / Transfers Assistance Needed: Kasandra Knudsen outdoors and nothing indoors. ADL's / Homemaking Assistance Needed: Pt states family will  help him run bath water ect but he bathes and dresses himself. Family does all cooking and cleaning.   Communication / Swallowing Assistance Needed: Pt with "grimacing" on face sometimes when swallowing and pt almost holds breath during these moments.  Appears to be in pain but states he is not but could not explain what is going on when this happens.  It happens once every 3-4 minutes throughout session. Comments: becoming less active lately per report.        OT Problem List: Decreased strength;Impaired balance (sitting and/or standing);Impaired vision/perception;Decreased cognition;Decreased safety awareness;Decreased knowledge of use of DME or AE   OT Treatment/Interventions: Self-care/ADL training;Therapeutic activities;DME and/or AE instruction    OT Goals(Current goals can be found in the care plan section) Acute Rehab OT Goals Patient Stated Goal: to go home. OT Goal Formulation: With patient Time For Goal Achievement: 03/02/16 Potential to Achieve Goals: Good ADL Goals Pt Will Perform Grooming: with supervision;standing Pt Will Perform Lower Body Bathing: with supervision;sit to/from stand Pt Will Perform Lower Body Dressing: with supervision;sit to/from stand Pt Will Perform Tub/Shower Transfer: Tub transfer;rolling [place];ambulating;3 in 1;with min assist Additional ADL Goal #1: Pt will walk to bathrooom with assistive device and toilet with 3:1 over commode with S.  OT Frequency: Min 2X/week   Barriers to D/C: Decreased caregiver support  unsure if pt has 24 hour S.  Co-evaluation              End of Session Equipment Utilized During Treatment: Rolling [place];Oxygen Nurse Communication: Mobility status  Activity Tolerance: Patient tolerated treatment well Patient left: in bed;with call bell/phone within reach;with bed alarm set;with family/visitor present   Time: 1201-1228 OT Time Calculation (min): 27 min Charges:  OT General Charges $OT Visit: 1  Procedure OT Evaluation $OT Eval Moderate Complexity: 1 Procedure OT Treatments $Self Care/Home Management : 8-22 mins G-Codes: OT G-codes **NOT FOR INPATIENT CLASS** Functional Assessment Tool Used: clinical judgement Functional Limitation: Self care Self Care Current Status [id number]): At least 20 percent but less than 40 percent impaired, limited or restricted Self Care Goal Status [id number]): At least 1 percent but less than 20 percent impaired, limited or restricted  Glenford Peers 02/17/2016, 12:49 PM  [phone]

## 2016-02-18 LAB — BASIC METABOLIC PANEL
ANION GAP: 7 (ref 5–15)
BUN: 27 mg/dL — AB (ref 6–20)
CALCIUM: 7.9 mg/dL — AB (ref 8.9–10.3)
CO2: 29 mmol/L (ref 22–32)
Chloride: 101 mmol/L (ref 101–111)
Creatinine, Ser: 1.3 mg/dL — ABNORMAL HIGH (ref 0.61–1.24)
GFR calc Af Amer: 56 mL/min — ABNORMAL LOW (ref >60)
GFR calc non Af Amer: 48 mL/min — ABNORMAL LOW (ref >60)
GLUCOSE: 109 mg/dL — AB (ref 65–99)
POTASSIUM: 3.3 mmol/L — AB (ref 3.5–5.1)
Sodium: 137 mmol/L (ref 135–145)

## 2016-02-18 LAB — GLUCOSE, CAPILLARY
GLUCOSE-CAPILLARY: 110 mg/dL — AB (ref 65–99)
GLUCOSE-CAPILLARY: 151 mg/dL — AB (ref 65–99)
GLUCOSE-CAPILLARY: 175 mg/dL — AB (ref 65–99)
Glucose-Capillary: 155 mg/dL — ABNORMAL HIGH (ref 65–99)

## 2016-02-18 LAB — CBC
HEMATOCRIT: 32.8 % — AB (ref 39.0–52.0)
Hemoglobin: 10.3 g/dL — ABNORMAL LOW (ref 13.0–17.0)
MCH: 26.8 pg (ref 26.0–34.0)
MCHC: 31.4 g/dL (ref 30.0–36.0)
MCV: 85.2 fL (ref 78.0–100.0)
Platelets: 131 10*3/uL — ABNORMAL LOW (ref 150–400)
RBC: 3.85 MIL/uL — ABNORMAL LOW (ref 4.22–5.81)
RDW: 14.8 % (ref 11.5–15.5)
WBC: 13.8 10*3/uL — AB (ref 4.0–10.5)

## 2016-02-18 LAB — URINE CULTURE

## 2016-02-18 LAB — T4, FREE: Free T4: 1.27 ng/dL — ABNORMAL HIGH (ref 0.61–1.12)

## 2016-02-18 MED ORDER — POTASSIUM CHLORIDE CRYS ER 20 MEQ PO TBCR
40.0000 meq | EXTENDED_RELEASE_TABLET | Freq: Once | ORAL | Status: AC
  Administered 2016-02-18: 40 meq via ORAL
  Filled 2016-02-18: qty 2

## 2016-02-18 NOTE — Evaluation (Signed)
Physical Therapy Evaluation Patient Details Name: Jose Kennedy MRN: [id number] DOB: 1927-07-28 Today's Date: 02/18/2016   History of Present Illness  Pt is an 82 yo male admitted with increased confusion, n/v, abdominal pains.  Pt with UTI and encepalopathy.  Pt with PMH of COPD, chronic diastolic heart failure, CVA in 1991.  Clinical Impression  Pt admitted with/for UTI and encephalopathy.  Pt currently limited functionally due to the problems listed below.  (see problems list.)  Pt will benefit from PT to maximize function and safety to be able to get home safely with available assist.  Pt is at min to min guard level presently and will need someone to be with him most of the time until he is at modified independent with cane or RW.      Follow Up Recommendations Home health PT;Supervision/Assistance - 24 hour (24 hours initially until pt is safe to be alone)    Equipment Recommendations  None recommended by PT    Recommendations for Other Services       Precautions / Restrictions Precautions Precautions: Fall Precaution Comments: pt reports one fall in last two weeks when he was "exercising" indoors. Restrictions Weight Bearing Restrictions: No      Mobility  Bed Mobility Overal bed mobility: Needs Assistance Bed Mobility: Supine to Sit;Sit to Supine     Supine to sit: Min assist Sit to supine: Min assist   General bed mobility comments: Pt required assist to come to full sitting.   There was still some mild listing posteriorly, but when tested he could manage minimal challenge.  Transfers Overall transfer level: Needs assistance Equipment used: Rolling [place] (2 wheeled)   Sit to Stand: Min assist         General transfer comment: assist to help come forward due to tendency to list posteriorly  Ambulation/Gait Ambulation/Gait assistance: Min guard Ambulation Distance (Feet): 200 Feet Assistive device:  (iv pole) Gait Pattern/deviations: Step-through  pattern   Gait velocity interpretation: Below normal speed for age/gender General Gait Details: stiff, mildly unsteady gait progressing to less stiff, but still with episodes of unsteadiness.  Needed use of the IV pole.  Will try cane next treatment  Stairs            Wheelchair Mobility    Modified Rankin (Stroke Patients Only)       Balance Overall balance assessment: Needs assistance Sitting-balance support: No upper extremity supported Sitting balance-Leahy Scale: Fair     Standing balance support: Single extremity supported Standing balance-Leahy Scale: Poor Standing balance comment: Pt must have outside support to remain standing.                             Pertinent Vitals/Pain      Home Living Family/patient expects to be discharged to:: Private residence Living Arrangements: Children Available Help at Discharge: Family;Available PRN/intermittently (pt states he does NOT have 24 hour S.) Type of Home: House Home Access: Stairs to enter Entrance Stairs-Rails: Right Entrance Stairs-Number of Steps: 3 Home Layout: One level;Laundry or work area in [place]: Environmental consultant - 2 wheels;[place] - 4 wheels;Cane - single point Additional Comments: Pt states he uses can but only outside.    Prior Function Level of Independence: Needs assistance   Gait / Transfers Assistance Needed: Kasandra Knudsen outdoors and nothing indoors.  ADL's / Homemaking Assistance Needed: Pt states family will help him run bath water ect but he bathes and dresses himself. Family  does all cooking and cleaning.    Comments: becoming less active lately per report.     Hand Dominance   Dominant Hand: Right    Extremity/Trunk Assessment   Upper Extremity Assessment: Defer to OT evaluation           Lower Extremity Assessment: Overall WFL for tasks assessed;Generalized weakness (bil weakness 4/5 grossly esp. proximally and trunk)      Cervical / Trunk Assessment: Normal  (but truncal weakness)  Communication   Communication: Expressive difficulties  Cognition Arousal/Alertness: Awake/alert Behavior During Therapy: WFL for tasks assessed/performed Overall Cognitive Status: No family/caregiver present to determine baseline cognitive functioning                      General Comments General comments (skin integrity, edema, etc.): SpO2 on RA 92/93%  HR 82 bpm     Exercises     Assessment/Plan    PT Assessment Patient needs continued PT services  PT Problem List Decreased strength;Decreased activity tolerance;Decreased balance;Decreased mobility;Cardiopulmonary status limiting activity          PT Treatment Interventions DME instruction;Gait training;Functional mobility training;Stair training;Therapeutic activities;Balance training;Patient/family education    PT Goals (Current goals can be found in the Care Plan section)  Acute Rehab PT Goals Patient Stated Goal: to go home. PT Goal Formulation: With patient Time For Goal Achievement: 02/25/16 Potential to Achieve Goals: Good    Frequency Min 3X/week   Barriers to discharge Decreased caregiver support family works    Co-evaluation               End of Session   Activity Tolerance: Patient tolerated treatment well Patient left: in bed;with call bell/phone within reach;with bed alarm set Nurse Communication: Mobility status         Time: 1650-1715 PT Time Calculation (min) (ACUTE ONLY): 25 min   Charges:   PT Evaluation $PT Eval Low Complexity: 1 Procedure PT Treatments $Gait Training: 8-22 mins   PT G Codes:        Gurnie Duris, Tessie Fass 02/18/2016, 5:25 PM 02/18/2016  Donnella Sham, PT [phone]  (pager)

## 2016-02-18 NOTE — Progress Notes (Addendum)
PROGRESS NOTE    Matson Donini  [id number] DOB: March 15, 1928 DOA: 02/16/2016 PCP: Pcp Not In System   Outpatient Specialists:     Brief Narrative:  Teshaun Cronan is a 82 y.o. male with a Past Medical History of COPD, chronic diastolic heart failure, diabetes, anemia, history of CVA in 1991 by into the ED by side for increased confusion with subjective chills, abdominal pain, nausea, vomiting, found to have likely urinary tract infection.   Assessment & Plan:   Active Problems:   Coronary artery disease   CKD (chronic kidney disease) stage 3, GFR 30-59 ml/min   Acute confusional state of metabolic origin   Diabetes mellitus with diabetic nephropathy (HCC)   Anemia of chronic disease   Acute confusion   Acute encephalopathy   Chronic diastolic CHF (congestive heart failure) (HCC)   Delirium   UTI (urinary tract infection)   Acute encephalopathy due to UTI/bacteremia (urine growing: enterococcus, blood growing Kleb PNA) Vitamin B-12 ok -ammonia level ok - TSH low-  ft4 slightly high-- follow up as outpatient -Rocephin -- increase to 2mg  OT/PT   AKI on Chronic kidney disease stage 3  baseline creatinine 1   -IVF -Cr improving -patient had CT scan, no need for U/S yet  Elevated liver enzymes -due to low BP from sepsis-- decreasing levels, no need to trend further  Type II Diabetes Hold home oral diabetic medications.  SSI Heart healthy/carb modified diet.  Anemia of chronic disease  -baseline 10  Dysphagia, chronic Continue Dysphagia 3 diet  Hypertension BP 109/60   Pulse 80    Controlled Continue home anti-hypertensive medications   Hyperlipidemia Continue home statins    DVT prophylaxis:  SQ Heparin  Code Status: Full Code   Family Communication: Daughter in  [place] at bedside  Disposition Plan:  -will need repeat Blood cultures in AM and to await sensitivites of blood cultures   Consultants:     Procedures:        Subjective: Eating well No back pain No SOB, no CP   Objective: Vitals:   02/17/16 2105 02/18/16 0536 02/18/16 0555 02/18/16 0759  BP: (!) 109/52 111/61 110/62 131/69  Pulse: 74 71 71 69  Resp: 17 17 20    Temp: 100.2 F (37.9 C) 100.1 F (37.8 C) (!) 100.8 F (38.2 C)   TempSrc: Oral Oral Oral   SpO2: 93% 92% 94%   Weight:   90.7 kg (200 lb)   Height:        Intake/Output Summary (Last 24 hours) at 02/18/16 1152 Last data filed at 02/18/16 0900  Gross per 24 hour  Intake          1596.67 ml  Output              550 ml  Net          1046.67 ml   Filed Weights   02/16/16 1819 02/17/16 0339 02/18/16 0555  Weight: 87.1 kg (192 lb 0.3 oz) 86.7 kg (191 lb 2.2 oz) 90.7 kg (200 lb)    Examination:  General exam: Appears calm and comfortable  Respiratory system: Clear to auscultation. Respiratory effort normal. Cardiovascular system: S1 & S2 heard, RRR. No JVD, murmurs, rubs, gallops or clicks. No pedal edema. Gastrointestinal system: Abdomen is nondistended, soft and nontender. No organomegaly or masses felt. Normal bowel sounds heard. Central nervous system: Alert and oriented. No focal neurological deficits.     Data Reviewed: I have personally reviewed following labs and imaging studies  CBC:  Recent Labs Lab 02/16/16 1241 02/17/16 0543 02/18/16 0441  WBC 4.3 16.8* 13.8*  HGB 12.4* 10.5* 10.3*  HCT 38.9* 33.2* 32.8*  MCV 84.2 84.7 85.2  PLT 193 152 [id number]*   Basic Metabolic Panel:  Recent Labs Lab 02/16/16 1241 02/17/16 0543 02/18/16 0441  NA 136 138 137  K 3.8 3.5 3.3*  CL 96* 101 101  CO2 31 27 29   GLUCOSE 183* 157* 109*  BUN 26* 31* 27*  CREATININE 1.25* 1.47* 1.30*  CALCIUM 9.2 7.9* 7.9*   GFR: Estimated Creatinine Clearance: 47 mL/min (by C-G formula based on SCr of 1.3 mg/dL (H)). Liver Function Tests:  Recent Labs Lab 02/16/16 1241 02/17/16 0543  AST 521* 191*  ALT 349* 241*  ALKPHOS 135* 85  BILITOT 1.1 0.5  PROT 6.0*  4.7*  ALBUMIN 3.2* 2.5*    Recent Labs Lab 02/16/16 1241  LIPASE 24    Recent Labs Lab 02/16/16 1724  AMMONIA 33   Coagulation Profile: No results for input(s): INR, PROTIME in the last 168 hours. Cardiac Enzymes: No results for input(s): CKTOTAL, CKMB, CKMBINDEX, TROPONINI in the last 168 hours. BNP (last 3 results) No results for input(s): PROBNP in the last 8760 hours. HbA1C:  Recent Labs  02/16/16 1724  HGBA1C 6.5*   CBG:  Recent Labs Lab 02/17/16 1247 02/17/16 1720 02/17/16 2106 02/18/16 0718 02/18/16 1132  GLUCAP 164* 107* 173* 110* 151*   Lipid Profile: No results for input(s): CHOL, HDL, LDLCALC, TRIG, CHOLHDL, LDLDIRECT in the last 72 hours. Thyroid Function Tests:  Recent Labs  02/16/16 1724 02/18/16 0441  TSH 0.325*  --   FREET4  --  1.27*   Anemia Panel:  Recent Labs  02/16/16 1724  VITAMINB12 761   Urine analysis:    Component Value Date/Time   COLORURINE AMBER (A) 02/16/2016 1455   APPEARANCEUR CLOUDY (A) 02/16/2016 1455   LABSPEC 1.014 02/16/2016 1455   PHURINE 7.0 02/16/2016 1455   GLUCOSEU NEGATIVE 02/16/2016 1455   HGBUR SMALL (A) 02/16/2016 1455   BILIRUBINUR NEGATIVE 02/16/2016 1455   KETONESUR NEGATIVE 02/16/2016 1455   PROTEINUR 30 (A) 02/16/2016 1455   UROBILINOGEN 1.0 09/20/2013 2245   NITRITE NEGATIVE 02/16/2016 1455   LEUKOCYTESUR TRACE (A) 02/16/2016 1455      Recent Results (from the past 240 hour(s))  Blood culture (routine x 2)     Status: None (Preliminary result)   Collection Time: 02/16/16  1:34 PM  Result Value Ref Range Status   Specimen Description BLOOD RIGHT ANTECUBITAL  Final   Special Requests BOTTLES DRAWN AEROBIC AND ANAEROBIC 5CC  Final   Culture  Setup Time   Final    GRAM NEGATIVE RODS IN BOTH AEROBIC AND ANAEROBIC BOTTLES CRITICAL RESULT CALLED TO, READ BACK BY AND VERIFIED WITH: CARON AMEND,PHARMD @0701  02/17/16 MKELLY,MLT    Culture GRAM NEGATIVE RODS  Final   Report Status PENDING   Incomplete  Blood culture (routine x 2)     Status: Abnormal (Preliminary result)   Collection Time: 02/16/16  1:50 PM  Result Value Ref Range Status   Specimen Description BLOOD RIGHT HAND  Final   Special Requests BOTTLES DRAWN AEROBIC ONLY 6CC  Final   Culture  Setup Time   Final    GRAM NEGATIVE RODS AEROBIC BOTTLE ONLY CRITICAL RESULT CALLED TO, READ BACK BY AND VERIFIED WITH: CARON AMEND,PHARMD @0701  02/17/16 MKELLY,MLT    Culture KLEBSIELLA PNEUMONIAE (A)  Final   Report Status PENDING  Incomplete  Blood  Culture ID Panel (Reflexed)     Status: Abnormal   Collection Time: 02/16/16  1:50 PM  Result Value Ref Range Status   Enterococcus species NOT DETECTED NOT DETECTED Final   Listeria monocytogenes NOT DETECTED NOT DETECTED Final   Staphylococcus species NOT DETECTED NOT DETECTED Final   Staphylococcus aureus NOT DETECTED NOT DETECTED Final   Streptococcus species NOT DETECTED NOT DETECTED Final   Streptococcus agalactiae NOT DETECTED NOT DETECTED Final   Streptococcus pneumoniae NOT DETECTED NOT DETECTED Final   Streptococcus pyogenes NOT DETECTED NOT DETECTED Final   Acinetobacter baumannii NOT DETECTED NOT DETECTED Final   Enterobacteriaceae species DETECTED (A) NOT DETECTED Final    Comment: CRITICAL RESULT CALLED TO, READ BACK BY AND VERIFIED WITH: CARON AMEND,PHARMD @0701  02/17/16 MKELLY,MLT    Enterobacter cloacae complex NOT DETECTED NOT DETECTED Final   Escherichia coli NOT DETECTED NOT DETECTED Final   Klebsiella oxytoca NOT DETECTED NOT DETECTED Final   Klebsiella pneumoniae DETECTED (A) NOT DETECTED Final    Comment: CRITICAL RESULT CALLED TO, READ BACK BY AND VERIFIED WITH: CARON AMEND,PHARMD @0701  02/17/16 MKELLY,MLT    Proteus species NOT DETECTED NOT DETECTED Final   Serratia marcescens NOT DETECTED NOT DETECTED Final   Carbapenem resistance NOT DETECTED NOT DETECTED Final   Haemophilus influenzae NOT DETECTED NOT DETECTED Final   Neisseria meningitidis  NOT DETECTED NOT DETECTED Final   Pseudomonas aeruginosa NOT DETECTED NOT DETECTED Final   Candida albicans NOT DETECTED NOT DETECTED Final   Candida glabrata NOT DETECTED NOT DETECTED Final   Candida krusei NOT DETECTED NOT DETECTED Final   Candida parapsilosis NOT DETECTED NOT DETECTED Final   Candida tropicalis NOT DETECTED NOT DETECTED Final  Urine culture     Status: Abnormal   Collection Time: 02/16/16  2:55 PM  Result Value Ref Range Status   Specimen Description URINE, RANDOM  Final   Special Requests ADDED 1708  Final   Culture >=100,000 COLONIES/mL ENTEROCOCCUS FAECALIS (A)  Final   Report Status 02/18/2016 FINAL  Final   Organism ID, Bacteria ENTEROCOCCUS FAECALIS (A)  Final      Susceptibility   Enterococcus faecalis - MIC*    AMPICILLIN <=2 SENSITIVE Sensitive     LEVOFLOXACIN 2 SENSITIVE Sensitive     NITROFURANTOIN <=16 SENSITIVE Sensitive     VANCOMYCIN 1 SENSITIVE Sensitive     * >=100,000 COLONIES/mL ENTEROCOCCUS FAECALIS  Culture, blood (routine x 2)     Status: None (Preliminary result)   Collection Time: 02/18/16  4:45 AM  Result Value Ref Range Status   Specimen Description BLOOD LEFT ARM  Final   Special Requests BOTTLES DRAWN AEROBIC AND ANAEROBIC 5ML  Final   Culture NO GROWTH < 12 HOURS  Final   Report Status PENDING  Incomplete  Culture, blood (routine x 2)     Status: None (Preliminary result)   Collection Time: 02/18/16  4:57 AM  Result Value Ref Range Status   Specimen Description BLOOD RIGHT HAND  Final   Special Requests BOTTLES DRAWN AEROBIC AND ANAEROBIC 4ML  Final   Culture NO GROWTH < 12 HOURS  Final   Report Status PENDING  Incomplete      Anti-infectives    Start     Dose/Rate Route Frequency Ordered Stop   02/17/16 1715  vancomycin (VANCOCIN) IVPB 1000 mg/200 mL premix     1,000 mg 200 mL/hr over 60 Minutes Intravenous Every 24 hours 02/17/16 1705     02/17/16 1600  cefTRIAXone (ROCEPHIN) 1  g in dextrose 5 % 50 mL IVPB  Status:   Discontinued     1 g 100 mL/hr over 30 Minutes Intravenous Every 24 hours 02/16/16 1732 02/17/16 0730   02/17/16 0800  cefTRIAXone (ROCEPHIN) 2 g in dextrose 5 % 50 mL IVPB     2 g 100 mL/hr over 30 Minutes Intravenous Every 24 hours 02/17/16 0730     02/16/16 1600  cefTRIAXone (ROCEPHIN) 1 g in dextrose 5 % 50 mL IVPB     1 g 100 mL/hr over 30 Minutes Intravenous  Once 02/16/16 1546 02/16/16 1627       Radiology Studies: Ct Abdomen Pelvis Wo Contrast  Result Date: 02/16/2016 CLINICAL DATA:  82 year old male with abdominal pain and fever. EXAM: CT ABDOMEN AND PELVIS WITHOUT CONTRAST TECHNIQUE: Multidetector CT imaging of the abdomen and pelvis was performed following the standard protocol without IV contrast. COMPARISON:  CT the abdomen and pelvis 07/15/2015. FINDINGS: Lower chest: Areas of mild cylindrical bronchiectasis and scarring are noted in the lower lobes of the lungs bilaterally. Aortic atherosclerosis in the distal descending thoracic aorta. Small hiatal hernia. Hepatobiliary: No definite cystic or solid hepatic lesions are identified on today's noncontrast CT examination. Status post cholecystectomy. Pancreas: No definite pancreatic mass or peripancreatic inflammatory changes on today's noncontrast CT examination. Spleen: Unremarkable. Adrenals/Urinary Tract: Again noted are numerous low-attenuation lesions scattered throughout both kidneys, incompletely characterized on today's noncontrast CT examination, but the majority of these were previously characterized as simple cysts. No hydroureteronephrosis. Urinary bladder is unremarkable in appearance. Bilateral adrenal glands are normal in appearance. Stomach/Bowel: The unenhanced appearance of the stomach is normal. There is no pathologic dilatation of small bowel or colon. There is a short segment of mid to distal small bowel which extends into a small supraumbilical ventral hernia. Several colonic diverticulae are noted, without  surrounding inflammatory changes to suggest an acute diverticulitis at this time. Status post appendectomy. Vascular/Lymphatic: Aortic atherosclerosis, without definite aneurysm in the abdominal or pelvic vasculature. No lymphadenopathy noted in the abdomen or pelvis. Reproductive: Prostate gland and seminal vesicles are unremarkable in appearance. Multiple serpiginous structures in the visualized portions of the scrotum bilaterally, suggestive of large varicoceles. Other: No significant volume of ascites.  No pneumoperitoneum. Musculoskeletal: There are no aggressive appearing lytic or blastic lesions noted in the visualized portions of the skeleton. IMPRESSION: 1. No acute findings to account for the patient's symptoms. 2. Mild colonic diverticulosis without evidence of acute diverticulitis at this time. 3. Small supraumbilical ventral hernia containing a short segment of mid to distal small bowel, without evidence of incarceration or obstruction at this time. 4. Aortic atherosclerosis. 5. Multiple large low-attenuation lesions in the kidneys bilaterally, similar to the prior study, previously characterized as cysts. 6. Additional incidental findings, as above. Electronically Signed   By: Vinnie Langton M.D.   On: 02/16/2016 15:40   Dg Chest 2 View  Result Date: 02/16/2016 CLINICAL DATA:  Fever, pain EXAM: CHEST  2 VIEW COMPARISON:  07/15/2015 FINDINGS: Cardiomediastinal silhouette is stable. No infiltrate or pleural effusion. No pulmonary edema. Stable mild perihilar chronic bronchitic changes. Atherosclerotic calcifications of thoracic aorta. Mild degenerative changes thoracic spine. IMPRESSION: No active disease.  Stable perihilar chronic bronchitic changes. Electronically Signed   By: Lahoma Crocker M.D.   On: 02/16/2016 14:38        Scheduled Meds: . amLODipine  5 mg Oral Daily  . aspirin EC  81 mg Oral Daily  . carvedilol  12.5 mg Oral BID WC  .  cefTRIAXone (ROCEPHIN)  IV  2 g Intravenous Q24H   . docusate sodium  100 mg Oral BID  . ferrous sulfate  325 mg Oral Q breakfast  . heparin  5,000 Units Subcutaneous Q8H  . hydrALAZINE  10 mg Oral TID  . insulin aspart  0-9 Units Subcutaneous TID WC  . pantoprazole  40 mg Oral Daily  . pravastatin  40 mg Oral QHS  . sucralfate  1 g Oral QID  . vancomycin  1,000 mg Intravenous Q24H   Continuous Infusions: . sodium chloride 100 mL/hr at 02/18/16 0537     LOS: 1 day    Time spent: 25 min    [place], DO [facility] Pager [phone]  If 7PM-7AM, please contact night-coverage [email or web address] Password [email or web address] 02/18/2016, 11:52 AM

## 2016-02-19 DIAGNOSIS — R410 Disorientation, unspecified: Secondary | ICD-10-CM

## 2016-02-19 DIAGNOSIS — R7881 Bacteremia: Secondary | ICD-10-CM

## 2016-02-19 LAB — CULTURE, BLOOD (ROUTINE X 2)

## 2016-02-19 LAB — BASIC METABOLIC PANEL
Anion gap: 5 (ref 5–15)
BUN: 20 mg/dL (ref 6–20)
CALCIUM: 8.2 mg/dL — AB (ref 8.9–10.3)
CO2: 27 mmol/L (ref 22–32)
CREATININE: 1.16 mg/dL (ref 0.61–1.24)
Chloride: 106 mmol/L (ref 101–111)
GFR, EST NON AFRICAN AMERICAN: 55 mL/min — AB (ref >60)
Glucose, Bld: 183 mg/dL — ABNORMAL HIGH (ref 65–99)
Potassium: 4 mmol/L (ref 3.5–5.1)
SODIUM: 138 mmol/L (ref 135–145)

## 2016-02-19 LAB — CBC
HCT: 36.6 % — ABNORMAL LOW (ref 39.0–52.0)
Hemoglobin: 11.4 g/dL — ABNORMAL LOW (ref 13.0–17.0)
MCH: 26.5 pg (ref 26.0–34.0)
MCHC: 31.1 g/dL (ref 30.0–36.0)
MCV: 85.1 fL (ref 78.0–100.0)
Platelets: 152 10*3/uL (ref 150–400)
RBC: 4.3 MIL/uL (ref 4.22–5.81)
RDW: 14.5 % (ref 11.5–15.5)
WBC: 10.3 10*3/uL (ref 4.0–10.5)

## 2016-02-19 LAB — GLUCOSE, CAPILLARY
GLUCOSE-CAPILLARY: 120 mg/dL — AB (ref 65–99)
GLUCOSE-CAPILLARY: 171 mg/dL — AB (ref 65–99)
Glucose-Capillary: 172 mg/dL — ABNORMAL HIGH (ref 65–99)
Glucose-Capillary: 159 mg/dL — ABNORMAL HIGH (ref 65–99)

## 2016-02-19 MED ORDER — PHENOL 1.4 % MT LIQD
1.0000 | OROMUCOSAL | Status: DC | PRN
  Administered 2016-02-19: 1 via OROMUCOSAL
  Filled 2016-02-19: qty 177

## 2016-02-19 MED ORDER — SODIUM CHLORIDE 0.9 % IV SOLN
3.0000 g | Freq: Four times a day (QID) | INTRAVENOUS | Status: DC
  Administered 2016-02-19 – 2016-02-21 (×9): 3 g via INTRAVENOUS
  Filled 2016-02-19 (×12): qty 3

## 2016-02-19 NOTE — Care Management Note (Addendum)
Case Management Note  Patient Details  Name: Jose Kennedy MRN: [id number] Date of Birth: 03-31-1928   Subjective/Objective:                 Patient from home, admitted with confusion in setting of UTI, mentation clearer today, continues IV Rocephin. PT eval pending, had recent fall at home. Patient has cane, goes to [facility] at [facility], and is active there during the day, takes walks outside.   Action/Plan:  Will continue to follow for [facility] needs. Provided DIL at bedside with list of [facility] providers to choose from.       Addendum: 02/20/16 Spoke with patient's daughter Ivin Booty, she declined assistance with establishing home care. Family would prefer to set up private PT as needed. Declined assistance obtaining DME.     Expected Discharge Date:                  Expected Discharge Plan:  Home/Self Care  In-House Referral:  NA  Discharge planning Services  CM Consult  Post Acute Care Choice:  NA Choice offered to:  Adult Children  DME Arranged:  N/A DME Agency:  NA  HH Arranged:  Patient Refused [place] Agency:   (Patient's daughter in law Ivin Booty left message on List of [place] Providers at bedside that she would set up [facility] after DC and did not require any assistance from CM or SW. )  Status of Service:  In process, will continue to follow  If discussed at Long Length of Stay Meetings, dates discussed:    Additional Comments:  Carles Collet, RN 02/19/2016, 12:07 PM

## 2016-02-19 NOTE — Progress Notes (Signed)
Pharmacy Antibiotic Note Jose Kennedy is a 82 y.o. male admitted on 02/16/2016 that is currently on Ceftriaxone and vancomycin for E.faecalis UTI and K.pneumoniae bacteremia. Pharmacy has been consulted to transition to Unasyn.   Plan: 1. Begin Unasyn 3 grams IV every 6 hours 2. Plan to transition to PO agent such as Augmentin when fever curve improves 3. Following along with you daily  Height: 5\' 11"  (180.3 cm) Weight: 200 lb 6.4 oz (90.9 kg) IBW/kg (Calculated) : 75.3  Temp (24hrs), Avg:100 F (37.8 C), Min:99.2 F (37.3 C), Max:100.7 F (38.2 C)   Recent Labs Lab 02/16/16 1241  02/16/16 1629 02/16/16 1858 02/16/16 2231 02/17/16 0543 02/17/16 1033 02/18/16 0441  WBC 4.3  --   --   --   --  16.8*  --  13.8*  CREATININE 1.25*  --   --   --   --  1.47*  --  1.30*  LATICACIDVEN  --   < > 1.74 1.6 2.6* 2.2* 1.2  --   < > = values in this interval not displayed.  Estimated Creatinine Clearance: 47 mL/min (by C-G formula based on SCr of 1.3 mg/dL (H)).    No Known Allergies  Antimicrobials this admission:  Unasyn 10/25 >>  CTX 10/23>>10/25 Vanc  10/23>>10/25  Microbiology results:  10/24 BCx: ngtd 10/22 BCx: K.pneumoniae only R to ampicillin  10/22 UCx: E.Faecalis pan S   Thank you for allowing pharmacy to be a part of this patient's care.  Vincenza Hews, PharmD, BCPS 02/19/2016, 11:44 AM Pager: [phone]

## 2016-02-19 NOTE — Progress Notes (Signed)
Physical Therapy Treatment Patient Details Name: Jose Kennedy MRN: [id number] DOB: [birth year] Today's Date: 02/19/2016    History of Present Illness Pt is an 82 yo male admitted with increased confusion, n/v, abdominal pains.  Pt with UTI and encepalopathy.  Pt with PMH of COPD, chronic diastolic heart failure, CVA in 1991.    PT Comments    Patient tolerated gait and stair training this session. Pt is much safer with use of RW and instructed to use RW upon d/c home. Pt will need 24 hour assist/supervision initially for safety and he reported that his daughter works at least 8 hours a day.   Follow Up Recommendations  Home health PT;Supervision/Assistance - 24 hour (24 hours initially until pt is safe to be alone)     Equipment Recommendations  Rolling [place]awley with 5" wheels    Recommendations for Other Services       Precautions / Restrictions Precautions Precautions: Fall Precaution Comments: pt reported ~5 falls over the year Restrictions Weight Bearing Restrictions: No    Mobility  Bed Mobility               General bed mobility comments: pt OOB in chair upon arrival  Transfers Overall transfer level: Needs assistance Equipment used: Rolling Ehrler (2 wheeled);Straight cane Transfers: Sit to/from Stand Sit to Stand: Min guard         General transfer comment: min guard for safety; pt used momentum and needed increased time and effort to stand  Ambulation/Gait Ambulation/Gait assistance: Min guard Ambulation Distance (Feet): 200 Feet Assistive device: Rolling Jablon (2 wheeled);Straight cane Gait Pattern/deviations: Step-through pattern;Decreased stride length     General Gait Details: ambulated 1/2 distance with SPC and last 1/2 with RW; pt with short length steps and almost a shuffling gait at times when using SPC; pt with much more steady gait and improved step length and heel strike; cues for safe use of ADs, posture, and cadence   Stairs Stairs:  Yes Stairs assistance: Min assist Stair Management: One rail Left;With cane Number of Stairs: 5 General stair comments: cues for sequencing and technique; pt used hand rail and SPC; assist to steady  Wheelchair Mobility    Modified Rankin (Stroke Patients Only)       Balance     Sitting balance-Leahy Scale: Fair       Standing balance-Leahy Scale: Poor                      Cognition Arousal/Alertness: Awake/alert Behavior During Therapy: WFL for tasks assessed/performed Overall Cognitive Status: No family/caregiver present to determine baseline cognitive functioning                      Exercises      General Comments        Pertinent Vitals/Pain Pain Assessment: No/denies pain    Home Living                      Prior Function            PT Goals (current goals can now be found in the care plan section) Acute Rehab PT Goals Patient Stated Goal: to go home. Progress towards PT goals: Progressing toward goals    Frequency    Min 3X/week      PT Plan Current plan remains appropriate    Co-evaluation             End of Session Equipment Utilized  During Treatment: Gait belt Activity Tolerance: Patient tolerated treatment well Patient left: in chair;with call bell/phone within reach;with chair alarm set;with nursing/sitter in room     Time: [id number] PT Time Calculation (min) (ACUTE ONLY): 36 min  Charges:  $Gait Training: 8-22 mins $Therapeutic Activity: 8-22 mins                    G Codes:      [place] April, PTA Pager: [phone]   02/19/2016, 12:21 PM

## 2016-02-19 NOTE — Progress Notes (Signed)
PROGRESS NOTE    Jose Kennedy  [id number] DOB: 1928/04/19 DOA: 02/16/2016 PCP: Pcp Not In System   Outpatient Specialists:     Brief Narrative:  Jose Kennedy is a 82 y.o. male with a Past Medical History of COPD, chronic diastolic heart failure, diabetes, anemia, history of CVA in 1991 by into the ED by side for increased confusion with subjective chills, abdominal pain, nausea, vomiting, found to have likely urinary tract infection.   Assessment & Plan:   Active Problems:   Coronary artery disease   CKD (chronic kidney disease) stage 3, GFR 30-59 ml/min   Acute confusional state of metabolic origin   Diabetes mellitus with diabetic nephropathy (HCC)   Anemia of chronic disease   Acute confusion   Acute encephalopathy   Chronic diastolic CHF (congestive heart failure) (HCC)   Delirium   UTI (urinary tract infection)   Acute encephalopathy due to UTI/bacteremia (urine growing: enterococcus, blood growing Kleb PNA) Vitamin B-12 ok -ammonia level ok - TSH low-  ft4 slightly high-- follow up as outpatient -change abx to unasyn to cover both bacteria  AKI on Chronic kidney disease stage 3  baseline creatinine 1   -IVF -Cr improving  Elevated liver enzymes -due to low BP from sepsis-- decreasing levels, no need to trend further  Type II Diabetes Hold home oral diabetic medications.  SSI Heart healthy/carb modified diet.  Anemia of chronic disease  -baseline 10  Dysphagia, chronic Continue Dysphagia 3 diet  Hypertension BP 109/60   Pulse 80    Controlled Continue home anti-hypertensive medications   Hyperlipidemia Continue home statins    DVT prophylaxis:  SQ Heparin  Code Status: Full Code   Family Communication: Called son and daughter (dentist)-- family declines home health  Disposition Plan:  Home once fever resolved   Consultants:     Procedures:       Subjective: Eating well No overnight events   Objective: Vitals:     02/19/16 0439 02/19/16 0718 02/19/16 0933 02/19/16 1212  BP: (!) 145/51 (!) 142/75 (!) 145/80 129/69  Pulse: 72 78 79 73  Resp: 18 (!) 22 20 18   Temp: 99.2 F (37.3 C) (!) 100.5 F (38.1 C)  99.9 F (37.7 C)  TempSrc:  Oral  Oral  SpO2: 94% 95% 92% 96%  Weight:      Height:        Intake/Output Summary (Last 24 hours) at 02/19/16 1306 Last data filed at 02/19/16 1241  Gross per 24 hour  Intake             3170 ml  Output             1951 ml  Net             1219 ml   Filed Weights   02/17/16 0339 02/18/16 0555 02/19/16 0300  Weight: 86.7 kg (191 lb 2.2 oz) 90.7 kg (200 lb) 90.9 kg (200 lb 6.4 oz)    Examination:  General exam: Appears calm and comfortable  Respiratory system: Clear to auscultation. Respiratory effort normal. Cardiovascular system: S1 & S2 heard, RRR. No JVD, murmurs, rubs, gallops or clicks. No pedal edema. Gastrointestinal system: Abdomen is nondistended, soft and nontender. No organomegaly or masses felt. Normal bowel sounds heard. Central nervous system: Alert and oriented. No focal neurological deficits.     Data Reviewed: I have personally reviewed following labs and imaging studies  CBC:  Recent Labs Lab 02/16/16 1241 02/17/16 0543 02/18/16 0441 02/19/16 1115  WBC 4.3 16.8* 13.8* 10.3  HGB 12.4* 10.5* 10.3* 11.4*  HCT 38.9* 33.2* 32.8* 36.6*  MCV 84.2 84.7 85.2 85.1  PLT 193 152 131* 0000000   Basic Metabolic Panel:  Recent Labs Lab 02/16/16 1241 02/17/16 0543 02/18/16 0441 02/19/16 1115  NA 136 138 137 138  K 3.8 3.5 3.3* 4.0  CL 96* 101 101 106  CO2 31 27 29 27   GLUCOSE 183* 157* 109* 183*  BUN 26* 31* 27* 20  CREATININE 1.25* 1.47* 1.30* 1.16  CALCIUM 9.2 7.9* 7.9* 8.2*   GFR: Estimated Creatinine Clearance: 52.7 mL/min (by C-G formula based on SCr of 1.16 mg/dL). Liver Function Tests:  Recent Labs Lab 02/16/16 1241 02/17/16 0543  AST 521* 191*  ALT 349* 241*  ALKPHOS 135* 85  BILITOT 1.1 0.5  PROT 6.0* 4.7*   ALBUMIN 3.2* 2.5*    Recent Labs Lab 02/16/16 1241  LIPASE 24    Recent Labs Lab 02/16/16 1724  AMMONIA 33   Coagulation Profile: No results for input(s): INR, PROTIME in the last 168 hours. Cardiac Enzymes: No results for input(s): CKTOTAL, CKMB, CKMBINDEX, TROPONINI in the last 168 hours. BNP (last 3 results) No results for input(s): PROBNP in the last 8760 hours. HbA1C:  Recent Labs  02/16/16 1724  HGBA1C 6.5*   CBG:  Recent Labs Lab 02/18/16 1132 02/18/16 1711 02/18/16 2058 02/19/16 0755 02/19/16 1158  GLUCAP 151* 155* 175* 120* 171*   Lipid Profile: No results for input(s): CHOL, HDL, LDLCALC, TRIG, CHOLHDL, LDLDIRECT in the last 72 hours. Thyroid Function Tests:  Recent Labs  02/16/16 1724 02/18/16 0441  TSH 0.325*  --   FREET4  --  1.27*   Anemia Panel:  Recent Labs  02/16/16 1724  VITAMINB12 761   Urine analysis:    Component Value Date/Time   COLORURINE AMBER (A) 02/16/2016 1455   APPEARANCEUR CLOUDY (A) 02/16/2016 1455   LABSPEC 1.014 02/16/2016 1455   PHURINE 7.0 02/16/2016 1455   GLUCOSEU NEGATIVE 02/16/2016 1455   HGBUR SMALL (A) 02/16/2016 1455   BILIRUBINUR NEGATIVE 02/16/2016 1455   KETONESUR NEGATIVE 02/16/2016 1455   PROTEINUR 30 (A) 02/16/2016 1455   UROBILINOGEN 1.0 09/20/2013 2245   NITRITE NEGATIVE 02/16/2016 1455   LEUKOCYTESUR TRACE (A) 02/16/2016 1455      Recent Results (from the past 240 hour(s))  Blood culture (routine x 2)     Status: Abnormal   Collection Time: 02/16/16  1:34 PM  Result Value Ref Range Status   Specimen Description BLOOD RIGHT ANTECUBITAL  Final   Special Requests BOTTLES DRAWN AEROBIC AND ANAEROBIC 5CC  Final   Culture  Setup Time   Final    GRAM NEGATIVE RODS IN BOTH AEROBIC AND ANAEROBIC BOTTLES CRITICAL RESULT CALLED TO, READ BACK BY AND VERIFIED WITH: CARON AMEND,PHARMD @0701  02/17/16 MKELLY,MLT    Culture (A)  Final    KLEBSIELLA PNEUMONIAE SUSCEPTIBILITIES PERFORMED ON  PREVIOUS CULTURE WITHIN THE LAST 5 DAYS.    Report Status 02/19/2016 FINAL  Final  Blood culture (routine x 2)     Status: Abnormal   Collection Time: 02/16/16  1:50 PM  Result Value Ref Range Status   Specimen Description BLOOD RIGHT HAND  Final   Special Requests BOTTLES DRAWN AEROBIC ONLY 6CC  Final   Culture  Setup Time   Final    GRAM NEGATIVE RODS AEROBIC BOTTLE ONLY CRITICAL RESULT CALLED TO, READ BACK BY AND VERIFIED WITH: CARON AMEND,PHARMD @0701  02/17/16 MKELLY,MLT    Culture KLEBSIELLA  PNEUMONIAE (A)  Final   Report Status 02/19/2016 FINAL  Final   Organism ID, Bacteria KLEBSIELLA PNEUMONIAE  Final      Susceptibility   Klebsiella pneumoniae - MIC*    AMPICILLIN 16 RESISTANT Resistant     CEFAZOLIN <=4 SENSITIVE Sensitive     CEFEPIME <=1 SENSITIVE Sensitive     CEFTAZIDIME <=1 SENSITIVE Sensitive     CEFTRIAXONE <=1 SENSITIVE Sensitive     CIPROFLOXACIN <=0.25 SENSITIVE Sensitive     GENTAMICIN <=1 SENSITIVE Sensitive     IMIPENEM <=0.25 SENSITIVE Sensitive     TRIMETH/SULFA <=20 SENSITIVE Sensitive     AMPICILLIN/SULBACTAM 4 SENSITIVE Sensitive     PIP/TAZO <=4 SENSITIVE Sensitive     Extended ESBL NEGATIVE Sensitive     * KLEBSIELLA PNEUMONIAE  Blood Culture ID Panel (Reflexed)     Status: Abnormal   Collection Time: 02/16/16  1:50 PM  Result Value Ref Range Status   Enterococcus species NOT DETECTED NOT DETECTED Final   Listeria monocytogenes NOT DETECTED NOT DETECTED Final   Staphylococcus species NOT DETECTED NOT DETECTED Final   Staphylococcus aureus NOT DETECTED NOT DETECTED Final   Streptococcus species NOT DETECTED NOT DETECTED Final   Streptococcus agalactiae NOT DETECTED NOT DETECTED Final   Streptococcus pneumoniae NOT DETECTED NOT DETECTED Final   Streptococcus pyogenes NOT DETECTED NOT DETECTED Final   Acinetobacter baumannii NOT DETECTED NOT DETECTED Final   Enterobacteriaceae species DETECTED (A) NOT DETECTED Final    Comment: CRITICAL RESULT  CALLED TO, READ BACK BY AND VERIFIED WITH: CARON AMEND,PHARMD @0701  02/17/16 MKELLY,MLT    Enterobacter cloacae complex NOT DETECTED NOT DETECTED Final   Escherichia coli NOT DETECTED NOT DETECTED Final   Klebsiella oxytoca NOT DETECTED NOT DETECTED Final   Klebsiella pneumoniae DETECTED (A) NOT DETECTED Final    Comment: CRITICAL RESULT CALLED TO, READ BACK BY AND VERIFIED WITH: CARON AMEND,PHARMD @0701  02/17/16 MKELLY,MLT    Proteus species NOT DETECTED NOT DETECTED Final   Serratia marcescens NOT DETECTED NOT DETECTED Final   Carbapenem resistance NOT DETECTED NOT DETECTED Final   Haemophilus influenzae NOT DETECTED NOT DETECTED Final   Neisseria meningitidis NOT DETECTED NOT DETECTED Final   Pseudomonas aeruginosa NOT DETECTED NOT DETECTED Final   Candida albicans NOT DETECTED NOT DETECTED Final   Candida glabrata NOT DETECTED NOT DETECTED Final   Candida krusei NOT DETECTED NOT DETECTED Final   Candida parapsilosis NOT DETECTED NOT DETECTED Final   Candida tropicalis NOT DETECTED NOT DETECTED Final  Urine culture     Status: Abnormal   Collection Time: 02/16/16  2:55 PM  Result Value Ref Range Status   Specimen Description URINE, RANDOM  Final   Special Requests ADDED 1708  Final   Culture >=100,000 COLONIES/mL ENTEROCOCCUS FAECALIS (A)  Final   Report Status 02/18/2016 FINAL  Final   Organism ID, Bacteria ENTEROCOCCUS FAECALIS (A)  Final      Susceptibility   Enterococcus faecalis - MIC*    AMPICILLIN <=2 SENSITIVE Sensitive     LEVOFLOXACIN 2 SENSITIVE Sensitive     NITROFURANTOIN <=16 SENSITIVE Sensitive     VANCOMYCIN 1 SENSITIVE Sensitive     * >=100,000 COLONIES/mL ENTEROCOCCUS FAECALIS  Culture, blood (routine x 2)     Status: None (Preliminary result)   Collection Time: 02/18/16  4:45 AM  Result Value Ref Range Status   Specimen Description BLOOD LEFT ARM  Final   Special Requests BOTTLES DRAWN AEROBIC AND ANAEROBIC 5ML  Final   Culture NO  GROWTH < 12 HOURS   Final   Report Status PENDING  Incomplete  Culture, blood (routine x 2)     Status: None (Preliminary result)   Collection Time: 02/18/16  4:57 AM  Result Value Ref Range Status   Specimen Description BLOOD RIGHT HAND  Final   Special Requests BOTTLES DRAWN AEROBIC AND ANAEROBIC 4ML  Final   Culture NO GROWTH < 12 HOURS  Final   Report Status PENDING  Incomplete      Anti-infectives    Start     Dose/Rate Route Frequency Ordered Stop   02/19/16 1200  Ampicillin-Sulbactam (UNASYN) 3 g in sodium chloride 0.9 % 100 mL IVPB     3 g 200 mL/hr over 30 Minutes Intravenous Every 6 hours 02/19/16 1139     02/17/16 1715  vancomycin (VANCOCIN) IVPB 1000 mg/200 mL premix  Status:  Discontinued     1,000 mg 200 mL/hr over 60 Minutes Intravenous Every 24 hours 02/17/16 1705 02/19/16 1140   02/17/16 1600  cefTRIAXone (ROCEPHIN) 1 g in dextrose 5 % 50 mL IVPB  Status:  Discontinued     1 g 100 mL/hr over 30 Minutes Intravenous Every 24 hours 02/16/16 1732 02/17/16 0730   02/17/16 0800  cefTRIAXone (ROCEPHIN) 2 g in dextrose 5 % 50 mL IVPB  Status:  Discontinued     2 g 100 mL/hr over 30 Minutes Intravenous Every 24 hours 02/17/16 0730 02/19/16 1138   02/16/16 1600  cefTRIAXone (ROCEPHIN) 1 g in dextrose 5 % 50 mL IVPB     1 g 100 mL/hr over 30 Minutes Intravenous  Once 02/16/16 1546 02/16/16 1627       Radiology Studies: No results found.      Scheduled Meds: . amLODipine  5 mg Oral Daily  . ampicillin-sulbactam (UNASYN) IV  3 g Intravenous Q6H  . aspirin EC  81 mg Oral Daily  . carvedilol  12.5 mg Oral BID WC  . docusate sodium  100 mg Oral BID  . ferrous sulfate  325 mg Oral Q breakfast  . heparin  5,000 Units Subcutaneous Q8H  . hydrALAZINE  10 mg Oral TID  . insulin aspart  0-9 Units Subcutaneous TID WC  . pantoprazole  40 mg Oral Daily  . pravastatin  40 mg Oral QHS  . sucralfate  1 g Oral QID   Continuous Infusions: . sodium chloride 100 mL/hr at 02/19/16 0559     LOS:  2 days    Time spent: 25 min    Enigma, DO [facility] Pager [phone]  If 7PM-7AM, please contact night-coverage [email or web address] Password [email or web address] 02/19/2016, 1:06 PM

## 2016-02-20 DIAGNOSIS — D638 Anemia in other chronic diseases classified elsewhere: Secondary | ICD-10-CM

## 2016-02-20 DIAGNOSIS — N390 Urinary tract infection, site not specified: Secondary | ICD-10-CM

## 2016-02-20 LAB — GLUCOSE, CAPILLARY
GLUCOSE-CAPILLARY: 133 mg/dL — AB (ref 65–99)
GLUCOSE-CAPILLARY: 179 mg/dL — AB (ref 65–99)
GLUCOSE-CAPILLARY: 203 mg/dL — AB (ref 65–99)
Glucose-Capillary: 144 mg/dL — ABNORMAL HIGH (ref 65–99)

## 2016-02-20 MED ORDER — CIPROFLOXACIN HCL 500 MG PO TABS: 500.0000 mg | ORAL_TABLET | Freq: Two times a day (BID) | ORAL | 0 refills | Status: DC

## 2016-02-20 NOTE — Progress Notes (Signed)
Pt.has SB w/ 2nd degree HB HR=47.Pt.is asymptomatic.MD on call Dr.Hijazi was called & ordered EKG & results relayed to him .Ordered to just monitor pt.

## 2016-02-20 NOTE — Discharge Summary (Addendum)
Physician Discharge Summary  Jose Kennedy [id number] DOB: 1927/12/07 DOA: 02/16/2016  PCP: Dr Les Pou in [place]   This is an addendum to patient's discharge summary by Dr. Charlies Silvers on 10/26.  Admit date: 02/16/2016 Discharge date: 02/21/2016  Recommendations for Outpatient Follow-up:  Continue Cipro for 9 days on discharge for urinary tract infection. Stop date 03/02/2016 Please check LFTs during outpatient follow-up. If normalized May resume pravastatin.   Disposition: Home, daughter refused home health. CODE STATUS: Full code  Diet: Heart healthy  Discharge Diagnoses:  Active Problems:   Coronary artery disease   CKD (chronic kidney disease) stage 3, GFR 30-59 ml/min   Acute confusional state of metabolic origin   Diabetes mellitus with diabetic nephropathy (HCC)   Anemia of chronic disease   Acute confusion   Acute encephalopathy   Chronic diastolic CHF (congestive heart failure) (HCC)   Delirium   UTI (urinary tract infection)   Bacteremia  sinus bradycardia    Discharge Condition: stable   Diet recommendation: Heart healthy/Carb modified  History of present illness:   Per brief narrative October 25th 2017 "82 y.o.malewith a Past Medical History of COPD, chronic diastolic heart failure, diabetes, anemia, history of CVA in 1991 by into the ED by side for increased confusion with subjective chills, abdominal pain, nausea, vomiting, found to have likely urinary tract infection.'  Hospital Course:  Acute encephalopathy due to UTI/bacteremia (urine growing: enterococcus, blood growing Kleb PNA)  sensitive to quinolones. Unasyn changed to cipro for 9 days on discharge . Vitamin B-12 ok Ammonia level ok TSH low-  ft4 slightly high-- follow up as outpatient  Sinus bradycardia Heart rate going as low as 37 on the monitor overnight while he was asleep with Mobitz type I. EKG unremarkable. Stable on the monitor this morning. I will reduce his carvedilol dose to  6.25 mg twice a day  AKI on Chronic kidney disease stage 3 Cr at baseline  Elevated liver enzymes Due to low BP from sepsis-- levels improving. Will hold his pravastatin until levels normalized as outpatient.  Type II Diabetes Continue home insulin regimen   Anemia of chronic disease -baseline of 10  Dysphagia, chronic Continue Dysphagia 3 diet  Hypertension BP 109/60  Pulse 80  Controlled Continue home anti-hypertensive medications   Hyperlipidemia Continue home statin     Code Status: Full Code Family Communication:   D/w daughter on the phone   Consultants:   None   Procedures:   None   Signed:  Leisa Lenz, MD  [facility] 02/20/2016, 12:46 PM  Pager #: [phone]  Time spent in minutes: less than 30 minutes    Discharge Exam: Vitals:   02/20/16 0850 02/20/16 1030  BP: 134/66 (!) 156/84  Pulse: 72   Resp:    Temp:     Vitals:   02/19/16 2224 02/20/16 0545 02/20/16 0850 02/20/16 1030  BP: 129/69 139/67 134/66 (!) 156/84  Pulse: 71 72 72   Resp: 20 17    Temp: 99.8 F (37.7 C) 99.5 F (37.5 C)    TempSrc: Oral Oral    SpO2: 96% 95%    Weight:  90.4 kg (199 lb 4.7 oz)    Height:        General: Pt is alert, follows commands appropriately, not in acute distress Cardiovascular: Regular rate and rhythm, S1/S2 + Respiratory: Clear to auscultation bilaterally, no wheezing, no crackles, no rhonchi Abdominal: Soft, non tender, non distended, bowel sounds +, no guarding Extremities: no cyanosis, pulses palpable  bilaterally DP and PT Neuro: Grossly nonfocal  Discharge Instructions  Discharge Instructions    Diet - low sodium heart healthy    Complete by:  As directed    Discharge instructions    Complete by:  As directed    Continue Cipro for 9 days on discharge for urinary tract infection   Increase activity slowly    Complete by:  As directed        Medication List  Stop this medications  pravastatin  80 MG tablet Commonly known as:  PRAVACHOL 40 mg by mouth at bedtime.   TAKE these medications   amLODipine 10 MG tablet Commonly known as:  NORVASC Take 10 mg by mouth daily.   aspirin EC 81 MG tablet Take 81 mg by mouth daily.   BENEFIBER PO Take by mouth. Mix 2 teaspoonsful with 6-8 ounces of juice or fluid and drink once a day   carvedilol 12.5 MG tablet Commonly known as:  COREG Take 1/2  tablet (6.25 mg total) by mouth 2 (two) times daily with a meal.   ciprofloxacin 500 MG tablet Commonly known as:  CIPRO Take 1 tablet (500 mg total) by mouth 2 (two) times daily.   docusate sodium 100 MG capsule Commonly known as:  COLACE Take 100 mg by mouth 2 (two) times daily.   ferrous sulfate 325 (65 FE) MG tablet Take 325 mg by mouth daily with breakfast.   hydrALAZINE 10 MG tablet Commonly known as:  APRESOLINE Take 10 mg by mouth at bedtime.   hydrochlorothiazide 25 MG tablet Commonly known as:  HYDRODIURIL Take 25 mg by mouth daily.   insulin NPH-regular Human (70-30) 100 UNIT/ML injection Commonly known as:  NOVOLIN 70/30 Inject 10 Units into the skin 2 (two) times daily with a meal.   losartan 100 MG tablet Commonly known as:  COZAAR Take 100 mg by mouth daily.   Omeprazole-Sodium Bicarbonate 20-1100 MG Caps capsule Commonly known as:  ZEGERID Take 1 capsule by mouth daily before breakfast.   pantoprazole 40 MG tablet Commonly known as:  PROTONIX Take 40 mg by mouth daily.      senna 8.6 MG tablet Commonly known as:  SENOKOT Take 1 tablet by mouth daily as needed for constipation.   sucralfate 1 g tablet Commonly known as:  CARAFATE Take 1 g by mouth 4 (four) times daily.         The results of significant diagnostics from this hospitalization (including imaging, microbiology, ancillary and laboratory) are listed below for reference.    Significant Diagnostic Studies: Ct Abdomen Pelvis Wo Contrast  Result Date: 02/16/2016 CLINICAL DATA:   82 year old male with abdominal pain and fever. EXAM: CT ABDOMEN AND PELVIS WITHOUT CONTRAST TECHNIQUE: Multidetector CT imaging of the abdomen and pelvis was performed following the standard protocol without IV contrast. COMPARISON:  CT the abdomen and pelvis 07/15/2015. FINDINGS: Lower chest: Areas of mild cylindrical bronchiectasis and scarring are noted in the lower lobes of the lungs bilaterally. Aortic atherosclerosis in the distal descending thoracic aorta. Small hiatal hernia. Hepatobiliary: No definite cystic or solid hepatic lesions are identified on today's noncontrast CT examination. Status post cholecystectomy. Pancreas: No definite pancreatic mass or peripancreatic inflammatory changes on today's noncontrast CT examination. Spleen: Unremarkable. Adrenals/Urinary Tract: Again noted are numerous low-attenuation lesions scattered throughout both kidneys, incompletely characterized on today's noncontrast CT examination, but the majority of these were previously characterized as simple cysts. No hydroureteronephrosis. Urinary bladder is unremarkable in appearance. Bilateral adrenal glands are  normal in appearance. Stomach/Bowel: The unenhanced appearance of the stomach is normal. There is no pathologic dilatation of small bowel or colon. There is a short segment of mid to distal small bowel which extends into a small supraumbilical ventral hernia. Several colonic diverticulae are noted, without surrounding inflammatory changes to suggest an acute diverticulitis at this time. Status post appendectomy. Vascular/Lymphatic: Aortic atherosclerosis, without definite aneurysm in the abdominal or pelvic vasculature. No lymphadenopathy noted in the abdomen or pelvis. Reproductive: Prostate gland and seminal vesicles are unremarkable in appearance. Multiple serpiginous structures in the visualized portions of the scrotum bilaterally, suggestive of large varicoceles. Other: No significant volume of ascites.  No  pneumoperitoneum. Musculoskeletal: There are no aggressive appearing lytic or blastic lesions noted in the visualized portions of the skeleton. IMPRESSION: 1. No acute findings to account for the patient's symptoms. 2. Mild colonic diverticulosis without evidence of acute diverticulitis at this time. 3. Small supraumbilical ventral hernia containing a short segment of mid to distal small bowel, without evidence of incarceration or obstruction at this time. 4. Aortic atherosclerosis. 5. Multiple large low-attenuation lesions in the kidneys bilaterally, similar to the prior study, previously characterized as cysts. 6. Additional incidental findings, as above. Electronically Signed   By: Vinnie Langton M.D.   On: 02/16/2016 15:40   Dg Chest 2 View  Result Date: 02/16/2016 CLINICAL DATA:  Fever, pain EXAM: CHEST  2 VIEW COMPARISON:  07/15/2015 FINDINGS: Cardiomediastinal silhouette is stable. No infiltrate or pleural effusion. No pulmonary edema. Stable mild perihilar chronic bronchitic changes. Atherosclerotic calcifications of thoracic aorta. Mild degenerative changes thoracic spine. IMPRESSION: No active disease.  Stable perihilar chronic bronchitic changes. Electronically Signed   By: Lahoma Crocker M.D.   On: 02/16/2016 14:38    Microbiology: Recent Results (from the past 240 hour(s))  Blood culture (routine x 2)     Status: Abnormal   Collection Time: 02/16/16  1:34 PM  Result Value Ref Range Status   Specimen Description BLOOD RIGHT ANTECUBITAL  Final   Special Requests BOTTLES DRAWN AEROBIC AND ANAEROBIC 5CC  Final   Culture  Setup Time   Final    GRAM NEGATIVE RODS IN BOTH AEROBIC AND ANAEROBIC BOTTLES CRITICAL RESULT CALLED TO, READ BACK BY AND VERIFIED WITH: CARON AMEND,PHARMD @0701  02/17/16 MKELLY,MLT    Culture (A)  Final    KLEBSIELLA PNEUMONIAE SUSCEPTIBILITIES PERFORMED ON PREVIOUS CULTURE WITHIN THE LAST 5 DAYS.    Report Status 02/19/2016 FINAL  Final  Blood culture (routine x 2)      Status: Abnormal   Collection Time: 02/16/16  1:50 PM  Result Value Ref Range Status   Specimen Description BLOOD RIGHT HAND  Final   Special Requests BOTTLES DRAWN AEROBIC ONLY 6CC  Final   Culture  Setup Time   Final    GRAM NEGATIVE RODS AEROBIC BOTTLE ONLY CRITICAL RESULT CALLED TO, READ BACK BY AND VERIFIED WITH: CARON AMEND,PHARMD @0701  02/17/16 MKELLY,MLT    Culture KLEBSIELLA PNEUMONIAE (A)  Final   Report Status 02/19/2016 FINAL  Final   Organism ID, Bacteria KLEBSIELLA PNEUMONIAE  Final      Susceptibility   Klebsiella pneumoniae - MIC*    AMPICILLIN 16 RESISTANT Resistant     CEFAZOLIN <=4 SENSITIVE Sensitive     CEFEPIME <=1 SENSITIVE Sensitive     CEFTAZIDIME <=1 SENSITIVE Sensitive     CEFTRIAXONE <=1 SENSITIVE Sensitive     CIPROFLOXACIN <=0.25 SENSITIVE Sensitive     GENTAMICIN <=1 SENSITIVE Sensitive  IMIPENEM <=0.25 SENSITIVE Sensitive     TRIMETH/SULFA <=20 SENSITIVE Sensitive     AMPICILLIN/SULBACTAM 4 SENSITIVE Sensitive     PIP/TAZO <=4 SENSITIVE Sensitive     Extended ESBL NEGATIVE Sensitive     * KLEBSIELLA PNEUMONIAE  Blood Culture ID Panel (Reflexed)     Status: Abnormal   Collection Time: 02/16/16  1:50 PM  Result Value Ref Range Status   Enterococcus species NOT DETECTED NOT DETECTED Final   Listeria monocytogenes NOT DETECTED NOT DETECTED Final   Staphylococcus species NOT DETECTED NOT DETECTED Final   Staphylococcus aureus NOT DETECTED NOT DETECTED Final   Streptococcus species NOT DETECTED NOT DETECTED Final   Streptococcus agalactiae NOT DETECTED NOT DETECTED Final   Streptococcus pneumoniae NOT DETECTED NOT DETECTED Final   Streptococcus pyogenes NOT DETECTED NOT DETECTED Final   Acinetobacter baumannii NOT DETECTED NOT DETECTED Final   Enterobacteriaceae species DETECTED (A) NOT DETECTED Final    Comment: CRITICAL RESULT CALLED TO, READ BACK BY AND VERIFIED WITH: CARON AMEND,PHARMD @0701  02/17/16 MKELLY,MLT    Enterobacter cloacae  complex NOT DETECTED NOT DETECTED Final   Escherichia coli NOT DETECTED NOT DETECTED Final   Klebsiella oxytoca NOT DETECTED NOT DETECTED Final   Klebsiella pneumoniae DETECTED (A) NOT DETECTED Final    Comment: CRITICAL RESULT CALLED TO, READ BACK BY AND VERIFIED WITH: CARON AMEND,PHARMD @0701  02/17/16 MKELLY,MLT    Proteus species NOT DETECTED NOT DETECTED Final   Serratia marcescens NOT DETECTED NOT DETECTED Final   Carbapenem resistance NOT DETECTED NOT DETECTED Final   Haemophilus influenzae NOT DETECTED NOT DETECTED Final   Neisseria meningitidis NOT DETECTED NOT DETECTED Final   Pseudomonas aeruginosa NOT DETECTED NOT DETECTED Final   Candida albicans NOT DETECTED NOT DETECTED Final   Candida glabrata NOT DETECTED NOT DETECTED Final   Candida krusei NOT DETECTED NOT DETECTED Final   Candida parapsilosis NOT DETECTED NOT DETECTED Final   Candida tropicalis NOT DETECTED NOT DETECTED Final  Urine culture     Status: Abnormal   Collection Time: 02/16/16  2:55 PM  Result Value Ref Range Status   Specimen Description URINE, RANDOM  Final   Special Requests ADDED 1708  Final   Culture >=100,000 COLONIES/mL ENTEROCOCCUS FAECALIS (A)  Final   Report Status 02/18/2016 FINAL  Final   Organism ID, Bacteria ENTEROCOCCUS FAECALIS (A)  Final      Susceptibility   Enterococcus faecalis - MIC*    AMPICILLIN <=2 SENSITIVE Sensitive     LEVOFLOXACIN 2 SENSITIVE Sensitive     NITROFURANTOIN <=16 SENSITIVE Sensitive     VANCOMYCIN 1 SENSITIVE Sensitive     * >=100,000 COLONIES/mL ENTEROCOCCUS FAECALIS  Culture, blood (routine x 2)     Status: None (Preliminary result)   Collection Time: 02/18/16  4:45 AM  Result Value Ref Range Status   Specimen Description BLOOD LEFT ARM  Final   Special Requests BOTTLES DRAWN AEROBIC AND ANAEROBIC 5ML  Final   Culture NO GROWTH 1 DAY  Final   Report Status PENDING  Incomplete  Culture, blood (routine x 2)     Status: None (Preliminary result)    Collection Time: 02/18/16  4:57 AM  Result Value Ref Range Status   Specimen Description BLOOD RIGHT HAND  Final   Special Requests BOTTLES DRAWN AEROBIC AND ANAEROBIC 4ML  Final   Culture NO GROWTH 1 DAY  Final   Report Status PENDING  Incomplete     Labs: Basic Metabolic Panel:  Recent Labs Lab 02/16/16 1241  02/17/16 0543 02/18/16 0441 02/19/16 1115  NA 136 138 137 138  K 3.8 3.5 3.3* 4.0  CL 96* 101 101 106  CO2 31 27 29 27   GLUCOSE 183* 157* 109* 183*  BUN 26* 31* 27* 20  CREATININE 1.25* 1.47* 1.30* 1.16  CALCIUM 9.2 7.9* 7.9* 8.2*   Liver Function Tests:  Recent Labs Lab 02/16/16 1241 02/17/16 0543  AST 521* 191*  ALT 349* 241*  ALKPHOS 135* 85  BILITOT 1.1 0.5  PROT 6.0* 4.7*  ALBUMIN 3.2* 2.5*    Recent Labs Lab 02/16/16 1241  LIPASE 24    Recent Labs Lab 02/16/16 1724  AMMONIA 33   CBC:  Recent Labs Lab 02/16/16 1241 02/17/16 0543 02/18/16 0441 02/19/16 1115  WBC 4.3 16.8* 13.8* 10.3  HGB 12.4* 10.5* 10.3* 11.4*  HCT 38.9* 33.2* 32.8* 36.6*  MCV 84.2 84.7 85.2 85.1  PLT 193 152 131* 152   Cardiac Enzymes: No results for input(s): CKTOTAL, CKMB, CKMBINDEX, TROPONINI in the last 168 hours. BNP: BNP (last 3 results) No results for input(s): BNP in the last 8760 hours.  ProBNP (last 3 results) No results for input(s): PROBNP in the last 8760 hours.  CBG:  Recent Labs Lab 02/19/16 1158 02/19/16 1648 02/19/16 2235 02/20/16 0751 02/20/16 1207  GLUCAP 171* 172* 159* 133* 179*

## 2016-02-20 NOTE — Progress Notes (Signed)
Pt spiked fever so we will observe pt for next 24 hours prior to discharge.  Leisa Lenz [facility] [id number]

## 2016-02-20 NOTE — Progress Notes (Addendum)
Patient's son craig contacted to make aware of discharge. Pt stated he would pick him up. Cecilie Lowers did not have voicemail set up. Daughter sharon contacted and made aware.   4:37 PM MD paged and made aware of fever. Per patient's RN Steffanie Dunn, MD stated to hold discharge r/t fever and continue IV antibiotics overnight. Ivin Booty contacted to update and voicemail left.

## 2016-02-20 NOTE — Discharge Instructions (Addendum)
°  Temp 9935 and 98.8 prior to discharge    Urinary Tract Infection Urinary tract infections (UTIs) can develop anywhere along your urinary tract. Your urinary tract is your body's drainage system for removing wastes and extra water. Your urinary tract includes two kidneys, two ureters, a bladder, and a urethra. Your kidneys are a pair of bean-shaped organs. Each kidney is about the size of your fist. They are located below your ribs, one on each side of your spine. CAUSES Infections are caused by microbes, which are microscopic organisms, including fungi, viruses, and bacteria. These organisms are so small that they can only be seen through a microscope. Bacteria are the microbes that most commonly cause UTIs. SYMPTOMS  Symptoms of UTIs may vary by age and gender of the patient and by the location of the infection. Symptoms in young women typically include a frequent and intense urge to urinate and a painful, burning feeling in the bladder or urethra during urination. Older women and men are more likely to be tired, shaky, and weak and have muscle aches and abdominal pain. A fever may mean the infection is in your kidneys. Other symptoms of a kidney infection include pain in your back or sides below the ribs, nausea, and vomiting. DIAGNOSIS To diagnose a UTI, your caregiver will ask you about your symptoms. Your caregiver will also ask you to provide a urine sample. The urine sample will be tested for bacteria and white blood cells. White blood cells are made by your body to help fight infection. TREATMENT  Typically, UTIs can be treated with medication. Because most UTIs are caused by a bacterial infection, they usually can be treated with the use of antibiotics. The choice of antibiotic and length of treatment depend on your symptoms and the type of bacteria causing your infection. HOME CARE INSTRUCTIONS  If you were prescribed antibiotics, take them exactly as your caregiver instructs you. Finish  the medication even if you feel better after you have only taken some of the medication.  Drink enough water and fluids to keep your urine clear or pale yellow.  Avoid caffeine, tea, and carbonated beverages. They tend to irritate your bladder.  Empty your bladder often. Avoid holding urine for long periods of time.  Empty your bladder before and after sexual intercourse.  After a bowel movement, women should cleanse from front to back. Use each tissue only once. SEEK MEDICAL CARE IF:   You have back pain.  You develop a fever.  Your symptoms do not begin to resolve within 3 days. SEEK IMMEDIATE MEDICAL CARE IF:   You have severe back pain or lower abdominal pain.  You develop chills.  You have nausea or vomiting.  You have continued burning or discomfort with urination. MAKE SURE YOU:   Understand these instructions.  Will watch your condition.  Will get help right away if you are not doing well or get worse.   This information is not intended to replace advice given to you by your health care provider. Make sure you discuss any questions you have with your health care provider.   Document Released: 01/21/2005 Document Revised: 01/02/2015 Document Reviewed: 05/22/2011 Elsevier Interactive Patient Education [employer].

## 2016-02-20 NOTE — Progress Notes (Signed)
Occupational Therapy Treatment Patient Details Name: Attila Cusimano MRN: [id number] DOB: Aug 27, 1927 Today's Date: 02/20/2016    History of present illness Pt is an 82 yo male admitted with increased confusion, n/v, abdominal pains.  Pt with UTI and encepalopathy.  Pt with PMH of COPD, chronic diastolic heart failure, CVA in 1991.      Follow Up Recommendations  Home health OT;Supervision/Assistance - 24 hour    Equipment Recommendations  3 in 1 bedside comode;Other (comment) (3:1 for shower and toilet.)    Recommendations for Other Services      Precautions / Restrictions Precautions Precautions: Fall Precaution Comments: pt reported ~5 falls over the year Restrictions Weight Bearing Restrictions: No       Mobility Bed Mobility Overal bed mobility: Needs Assistance Bed Mobility: Supine to Sit;Sit to Supine     Supine to sit: Min assist Sit to supine: Min assist   General bed mobility comments: Pt required assist to come to full sitting.   There was still some mild listing posteriorly, but when tested he could manage minimal challenge.  Transfers Overall transfer level: Needs assistance Equipment used: Rolling Krehbiel (2 wheeled)   Sit to Stand: Min assist         General transfer comment: assist to help come forward due to tendency to list posteriorly        ADL Overall ADL's : Needs assistance/impaired Eating/Feeding: Minimal assistance;Sitting   Grooming: Wash/dry hands;Wash/dry face;Oral care;Sitting                                 General ADL Comments: Pt with posterior lean sitting EOB for breakfast.  Pt min A with sitting balance this day                Cognition   Behavior During Therapy: WFL for tasks assessed/performed Overall Cognitive Status: No family/caregiver present to determine baseline cognitive functioning                                             Frequency  Min 2X/week        Progress  Toward Goals  OT Goals(current goals can now be found in the care plan section)  Progress towards OT goals: Progressing toward goals     Plan Discharge plan remains appropriate    Co-evaluation                 End of Session Equipment Utilized During Treatment: Rolling Furuta   Activity Tolerance Patient tolerated treatment well   Patient Left in bed;with call bell/phone within reach;with bed alarm set   Nurse Communication Mobility status        Time: 1000-1026 OT Time Calculation (min): 26 min  Charges: OT General Charges $OT Visit: 1 Procedure OT Treatments $Self Care/Home Management : 23-37 mins  Roisin Mones, Thereasa Parkin 02/20/2016, 11:17 AM

## 2016-02-21 DIAGNOSIS — I493 Ventricular premature depolarization: Secondary | ICD-10-CM

## 2016-02-21 DIAGNOSIS — R7881 Bacteremia: Secondary | ICD-10-CM

## 2016-02-21 DIAGNOSIS — R740 Nonspecific elevation of levels of transaminase and lactic acid dehydrogenase [LDH]: Secondary | ICD-10-CM

## 2016-02-21 LAB — GLUCOSE, CAPILLARY
GLUCOSE-CAPILLARY: 128 mg/dL — AB (ref 65–99)
GLUCOSE-CAPILLARY: 155 mg/dL — AB (ref 65–99)
Glucose-Capillary: 154 mg/dL — ABNORMAL HIGH (ref 65–99)

## 2016-02-21 LAB — HEPATIC FUNCTION PANEL
ALK PHOS: 74 U/L (ref 38–126)
ALT: 80 U/L — AB (ref 17–63)
AST: 40 U/L (ref 15–41)
Albumin: 2.4 g/dL — ABNORMAL LOW (ref 3.5–5.0)
Total Bilirubin: 0.3 mg/dL (ref 0.3–1.2)
Total Protein: 5 g/dL — ABNORMAL LOW (ref 6.5–8.1)

## 2016-02-21 LAB — BASIC METABOLIC PANEL
ANION GAP: 7 (ref 5–15)
BUN: 10 mg/dL (ref 6–20)
CALCIUM: 8.5 mg/dL — AB (ref 8.9–10.3)
CO2: 29 mmol/L (ref 22–32)
CREATININE: 1.05 mg/dL (ref 0.61–1.24)
Chloride: 104 mmol/L (ref 101–111)
GLUCOSE: 158 mg/dL — AB (ref 65–99)
Potassium: 3.7 mmol/L (ref 3.5–5.1)
Sodium: 140 mmol/L (ref 135–145)

## 2016-02-21 LAB — MAGNESIUM: Magnesium: 1.6 mg/dL — ABNORMAL LOW (ref 1.7–2.4)

## 2016-02-21 MED ORDER — MAGNESIUM SULFATE 2 GM/50ML IV SOLN
2.0000 g | Freq: Once | INTRAVENOUS | Status: AC
  Administered 2016-02-21: 2 g via INTRAVENOUS
  Filled 2016-02-21: qty 50

## 2016-02-21 MED ORDER — POTASSIUM CHLORIDE CRYS ER 20 MEQ PO TBCR
40.0000 meq | EXTENDED_RELEASE_TABLET | Freq: Once | ORAL | Status: AC
  Administered 2016-02-21: 40 meq via ORAL
  Filled 2016-02-21: qty 2

## 2016-02-21 MED ORDER — AMLODIPINE BESYLATE 10 MG PO TABS: 10.0000 mg | ORAL_TABLET | Freq: Every day | ORAL | Status: DC

## 2016-02-21 MED ORDER — CARVEDILOL 6.25 MG PO TABS
6.2500 mg | ORAL_TABLET | Freq: Two times a day (BID) | ORAL | Status: DC
  Administered 2016-02-21: 6.25 mg via ORAL
  Filled 2016-02-21: qty 1

## 2016-02-21 MED ORDER — CARVEDILOL 12.5 MG PO TABS: 6.2500 mg | ORAL_TABLET | Freq: Two times a day (BID) | ORAL | 1 refills | Status: AC

## 2016-02-21 NOTE — Progress Notes (Signed)
Physical Therapy Treatment Patient Details Name: Jose Kennedy MRN: [id number] DOB: May 11, 1927 Today's Date: 02/21/2016    History of Present Illness Pt is an 82 yo male admitted with increased confusion, n/v, abdominal pains.  Pt with UTI and encepalopathy.  Pt with PMH of COPD, chronic diastolic heart failure, CVA in 1991.    PT Comments    Patient continues to need supervision/assistance for mobility. Current plan remains appropriate.  Follow Up Recommendations  Home health PT;Supervision/Assistance - 24 hour (24 hours initially until pt is safe to be alone)     Equipment Recommendations  Rolling [place] with 5" wheels    Recommendations for Other Services       Precautions / Restrictions Precautions Precautions: Fall Precaution Comments: pt reported ~5 falls over the year Restrictions Weight Bearing Restrictions: No    Mobility  Bed Mobility Overal bed mobility: Needs Assistance Bed Mobility: Supine to Sit     Supine to sit: Min assist     General bed mobility comments: HOB slightly elevated; assist to elevate trunk into sitting  Transfers Overall transfer level: Needs assistance Equipment used: Rolling [place] (2 wheeled);Straight cane Transfers: Sit to/from Stand Sit to Stand: Min guard         General transfer comment: cues for hand placement and safe use of AD  Ambulation/Gait Ambulation/Gait assistance: Supervision Ambulation Distance (Feet): 250 Feet Assistive device: Rolling [place] (2 wheeled);Straight cane Gait Pattern/deviations: Step-through pattern;Decreased stride length;Decreased dorsiflexion - right;Decreased dorsiflexion - left;Trunk flexed     General Gait Details: cues for posture, bilat step length and heel strike   Stairs            Wheelchair Mobility    Modified Rankin (Stroke Patients Only)       Balance     Sitting balance-Leahy Scale: Fair       Standing balance-Leahy Scale: Poor                       Cognition Arousal/Alertness: Awake/alert Behavior During Therapy: WFL for tasks assessed/performed Overall Cognitive Status: No family/caregiver present to determine baseline cognitive functioning                      Exercises      General Comments General comments (skin integrity, edema, etc.): discussed with pt again need to use RW when d/c home      Pertinent Vitals/Pain Pain Assessment: No/denies pain    Home Living                      Prior Function            PT Goals (current goals can now be found in the care plan section) Acute Rehab PT Goals Patient Stated Goal: to go home. Progress towards PT goals: Progressing toward goals    Frequency    Min 3X/week      PT Plan Current plan remains appropriate    Co-evaluation             End of Session Equipment Utilized During Treatment: Gait belt Activity Tolerance: Patient tolerated treatment well Patient left: in chair;with call bell/phone within reach;with chair alarm set     Time: 1206-1225 PT Time Calculation (min) (ACUTE ONLY): 19 min  Charges:  $Gait Training: 8-22 mins                    G Codes:      Lennette Bihari  Nelva Bush, PTA Pager: [phone]   02/21/2016, 1:16 PM

## 2016-02-21 NOTE — Progress Notes (Signed)
[place] to be D/C'd to home per MD order.  Discussed with the patient and all questions fully answered.  VSS, Skin clean, dry and intact without evidence of skin break down, no evidence of skin tears noted. IV catheter discontinued intact. Site without signs and symptoms of complications. Dressing and pressure applied.  An After Visit Summary was printed and given to the patient. Patient received prescriptions.  D/c education completed with patient/family including follow up instructions, medication list, d/c activities limitations if indicated, with other d/c instructions as indicated by MD - patient able to verbalize understanding, all questions fully answered.   Patient instructed to return to ED, call 911, or call MD for any changes in condition.   Patient escorted via [place], and D/C home via private auto.  Morley Kos Price 02/21/2016 5:56 PM

## 2016-02-21 NOTE — Care Management Important Message (Signed)
Important Message  Patient Details  Name: Jose Kennedy MRN: [id number] Date of Birth: 1928/02/05   Medicare Important Message Given:  Yes    [name] May 02/21/2016, 2:48 PM

## 2016-02-21 NOTE — Progress Notes (Signed)
PROGRESS NOTE                                                                                                                                                                                                             Patient Demographics:    Jose Kennedy, is a 82 y.o. male, DOB - 12-01-1927, [id number]  Admit date - 02/16/2016   Admitting Physician Maren Reamer, MD  Outpatient Primary MD for the patient is Pcp Not In System  LOS - 4    Chief Complaint  Patient presents with  . Hypertension  . Abdominal Pain  . Shaking       Brief Narrative   82 y.o.malewith a Past Medical History of COPD, chronic diastolic heart failure, diabetes, anemia, history of CVA in 1991 admitted with acute encephalopathy.   Subjective:   Afebrile since 4 pm yesterday. HR dropped to 37 during the night with mobitz type 1    Assessment  & Plan :   Acute encephalopathy secondary to UTI with bacteremia Urine cx growing enterococcus, blood cx growing enterobacter and klebsiella, suspect urinary source as well. Afebrile since last afternoon.  encephalopathy resolved. D/c on oral cipro to complete 2 weeks course.  AKI on CKD  resolved  PVCs with occasional bradycardia and 2nd degree HB mobitz type 1 Noted on tle overnight. EKG unremarkable. Bradycardic as low as 37 while asleep.   will reduce coreg dose to 6.25 mg bid. Replace low mg and k.  follow up as outpt.  transaminitis  suspect due to acute infection. Recheck LFTs today.   Rest of the plan as in d/c summary.  Code Status : full code  Family Communication  : none  Disposition Plan  : home    Lab Results  Component Value Date   PLT 152 02/19/2016    Antibiotics  :    Anti-infectives    Start     Dose/Rate Route Frequency Ordered Stop   02/20/16 0000  ciprofloxacin (CIPRO) 500 MG tablet     500 mg Oral 2 times daily 02/20/16 1246     02/19/16 1200   Ampicillin-Sulbactam (UNASYN) 3 g in sodium chloride 0.9 % 100 mL IVPB     3 g 200 mL/hr over 30 Minutes Intravenous Every 6 hours 02/19/16 1139     02/17/16 1715  vancomycin (  VANCOCIN) IVPB 1000 mg/200 mL premix  Status:  Discontinued     1,000 mg 200 mL/hr over 60 Minutes Intravenous Every 24 hours 02/17/16 1705 02/19/16 1140   02/17/16 1600  cefTRIAXone (ROCEPHIN) 1 g in dextrose 5 % 50 mL IVPB  Status:  Discontinued     1 g 100 mL/hr over 30 Minutes Intravenous Every 24 hours 02/16/16 1732 02/17/16 0730   02/17/16 0800  cefTRIAXone (ROCEPHIN) 2 g in dextrose 5 % 50 mL IVPB  Status:  Discontinued     2 g 100 mL/hr over 30 Minutes Intravenous Every 24 hours 02/17/16 0730 02/19/16 1138   02/16/16 1600  cefTRIAXone (ROCEPHIN) 1 g in dextrose 5 % 50 mL IVPB     1 g 100 mL/hr over 30 Minutes Intravenous  Once 02/16/16 1546 02/16/16 1627        Objective:   Vitals:   02/21/16 0516 02/21/16 0600 02/21/16 0854 02/21/16 0856  BP: (!) 175/85 (!) 152/73  (!) 172/75  Pulse: 77 71  76  Resp: 18     Temp: 99.1 F (37.3 C)  99 F (37.2 C)   TempSrc: Oral  Oral   SpO2: 92%     Weight:      Height:        Wt Readings from Last 3 Encounters:  02/21/16 94.1 kg (207 lb 6.4 oz)  07/16/15 91 kg (200 lb 9.9 oz)  06/23/15 83.9 kg (185 lb)     Intake/Output Summary (Last 24 hours) at 02/21/16 1312 Last data filed at 02/21/16 0700  Gross per 24 hour  Intake             1120 ml  Output             1280 ml  Net             -160 ml     Physical Exam  Gen: not in distress HEENT: moist mucosa, supple neck Chest: clear b/l, no added sounds CVS: N S1&S2, no murmurs,  GI: soft, NT, ND,  Musculoskeletal: warm, no edema     Data Review:    CBC  Recent Labs Lab 02/16/16 1241 02/17/16 0543 02/18/16 0441 02/19/16 1115  WBC 4.3 16.8* 13.8* 10.3  HGB 12.4* 10.5* 10.3* 11.4*  HCT 38.9* 33.2* 32.8* 36.6*  PLT 193 152 131* 152  MCV 84.2 84.7 85.2 85.1  MCH 26.8 26.8 26.8 26.5    MCHC 31.9 31.6 31.4 31.1  RDW 13.8 14.4 14.8 14.5    Chemistries   Recent Labs Lab 02/16/16 1241 02/17/16 0543 02/18/16 0441 02/19/16 1115 02/21/16 1034  NA 136 138 137 138 140  K 3.8 3.5 3.3* 4.0 3.7  CL 96* 101 101 106 104  CO2 31 27 29 27 29   GLUCOSE 183* 157* 109* 183* 158*  BUN 26* 31* 27* 20 10  CREATININE 1.25* 1.47* 1.30* 1.16 1.05  CALCIUM 9.2 7.9* 7.9* 8.2* 8.5*  MG  --   --   --   --  1.6*  AST 521* 191*  --   --   --   ALT 349* 241*  --   --   --   ALKPHOS 135* 85  --   --   --   BILITOT 1.1 0.5  --   --   --    ------------------------------------------------------------------------------------------------------------------ No results for input(s): CHOL, HDL, LDLCALC, TRIG, CHOLHDL, LDLDIRECT in the last 72 hours.  Lab Results  Component Value Date   HGBA1C  6.5 (H) 02/16/2016   ------------------------------------------------------------------------------------------------------------------ No results for input(s): TSH, T4TOTAL, T3FREE, THYROIDAB in the last 72 hours.  Invalid input(s): FREET3 ------------------------------------------------------------------------------------------------------------------ No results for input(s): VITAMINB12, FOLATE, FERRITIN, TIBC, IRON, RETICCTPCT in the last 72 hours.  Coagulation profile No results for input(s): INR, PROTIME in the last 168 hours.  No results for input(s): DDIMER in the last 72 hours.  Cardiac Enzymes No results for input(s): CKMB, TROPONINI, MYOGLOBIN in the last 168 hours.  Invalid input(s): CK ------------------------------------------------------------------------------------------------------------------ No results found for: BNP  Inpatient Medications  Scheduled Meds: . [START ON 02/22/2016] amLODipine  10 mg Oral Daily  . ampicillin-sulbactam (UNASYN) IV  3 g Intravenous Q6H  . aspirin EC  81 mg Oral Daily  . carvedilol  6.25 mg Oral BID WC  . docusate sodium  100 mg Oral BID  .  ferrous sulfate  325 mg Oral Q breakfast  . heparin  5,000 Units Subcutaneous Q8H  . hydrALAZINE  10 mg Oral TID  . insulin aspart  0-9 Units Subcutaneous TID WC  . pantoprazole  40 mg Oral Daily  . pravastatin  40 mg Oral QHS  . sucralfate  1 g Oral QID   Continuous Infusions:  PRN Meds:.acetaminophen **OR** acetaminophen, bisacodyl, HYDROcodone-acetaminophen, magnesium citrate, ondansetron **OR** ondansetron (ZOFRAN) IV, phenol, senna-docusate, traZODone  Micro Results Recent Results (from the past 240 hour(s))  Blood culture (routine x 2)     Status: Abnormal   Collection Time: 02/16/16  1:34 PM  Result Value Ref Range Status   Specimen Description BLOOD RIGHT ANTECUBITAL  Final   Special Requests BOTTLES DRAWN AEROBIC AND ANAEROBIC 5CC  Final   Culture  Setup Time   Final    GRAM NEGATIVE RODS IN BOTH AEROBIC AND ANAEROBIC BOTTLES CRITICAL RESULT CALLED TO, READ BACK BY AND VERIFIED WITH: CARON AMEND,PHARMD @0701  02/17/16 MKELLY,MLT    Culture (A)  Final    KLEBSIELLA PNEUMONIAE SUSCEPTIBILITIES PERFORMED ON PREVIOUS CULTURE WITHIN THE LAST 5 DAYS.    Report Status 02/19/2016 FINAL  Final  Blood culture (routine x 2)     Status: Abnormal   Collection Time: 02/16/16  1:50 PM  Result Value Ref Range Status   Specimen Description BLOOD RIGHT HAND  Final   Special Requests BOTTLES DRAWN AEROBIC ONLY 6CC  Final   Culture  Setup Time   Final    GRAM NEGATIVE RODS AEROBIC BOTTLE ONLY CRITICAL RESULT CALLED TO, READ BACK BY AND VERIFIED WITH: CARON AMEND,PHARMD @0701  02/17/16 MKELLY,MLT    Culture KLEBSIELLA PNEUMONIAE (A)  Final   Report Status 02/19/2016 FINAL  Final   Organism ID, Bacteria KLEBSIELLA PNEUMONIAE  Final      Susceptibility   Klebsiella pneumoniae - MIC*    AMPICILLIN 16 RESISTANT Resistant     CEFAZOLIN <=4 SENSITIVE Sensitive     CEFEPIME <=1 SENSITIVE Sensitive     CEFTAZIDIME <=1 SENSITIVE Sensitive     CEFTRIAXONE <=1 SENSITIVE Sensitive      CIPROFLOXACIN <=0.25 SENSITIVE Sensitive     GENTAMICIN <=1 SENSITIVE Sensitive     IMIPENEM <=0.25 SENSITIVE Sensitive     TRIMETH/SULFA <=20 SENSITIVE Sensitive     AMPICILLIN/SULBACTAM 4 SENSITIVE Sensitive     PIP/TAZO <=4 SENSITIVE Sensitive     Extended ESBL NEGATIVE Sensitive     * KLEBSIELLA PNEUMONIAE  Blood Culture ID Panel (Reflexed)     Status: Abnormal   Collection Time: 02/16/16  1:50 PM  Result Value Ref Range Status   Enterococcus species NOT DETECTED  NOT DETECTED Final   Listeria monocytogenes NOT DETECTED NOT DETECTED Final   Staphylococcus species NOT DETECTED NOT DETECTED Final   Staphylococcus aureus NOT DETECTED NOT DETECTED Final   Streptococcus species NOT DETECTED NOT DETECTED Final   Streptococcus agalactiae NOT DETECTED NOT DETECTED Final   Streptococcus pneumoniae NOT DETECTED NOT DETECTED Final   Streptococcus pyogenes NOT DETECTED NOT DETECTED Final   Acinetobacter baumannii NOT DETECTED NOT DETECTED Final   Enterobacteriaceae species DETECTED (A) NOT DETECTED Final    Comment: CRITICAL RESULT CALLED TO, READ BACK BY AND VERIFIED WITH: CARON AMEND,PHARMD @0701  02/17/16 MKELLY,MLT    Enterobacter cloacae complex NOT DETECTED NOT DETECTED Final   Escherichia coli NOT DETECTED NOT DETECTED Final   Klebsiella oxytoca NOT DETECTED NOT DETECTED Final   Klebsiella pneumoniae DETECTED (A) NOT DETECTED Final    Comment: CRITICAL RESULT CALLED TO, READ BACK BY AND VERIFIED WITH: CARON AMEND,PHARMD @0701  02/17/16 MKELLY,MLT    Proteus species NOT DETECTED NOT DETECTED Final   Serratia marcescens NOT DETECTED NOT DETECTED Final   Carbapenem resistance NOT DETECTED NOT DETECTED Final   Haemophilus influenzae NOT DETECTED NOT DETECTED Final   Neisseria meningitidis NOT DETECTED NOT DETECTED Final   Pseudomonas aeruginosa NOT DETECTED NOT DETECTED Final   Candida albicans NOT DETECTED NOT DETECTED Final   Candida glabrata NOT DETECTED NOT DETECTED Final   Candida  krusei NOT DETECTED NOT DETECTED Final   Candida parapsilosis NOT DETECTED NOT DETECTED Final   Candida tropicalis NOT DETECTED NOT DETECTED Final  Urine culture     Status: Abnormal   Collection Time: 02/16/16  2:55 PM  Result Value Ref Range Status   Specimen Description URINE, RANDOM  Final   Special Requests ADDED 1708  Final   Culture >=100,000 COLONIES/mL ENTEROCOCCUS FAECALIS (A)  Final   Report Status 02/18/2016 FINAL  Final   Organism ID, Bacteria ENTEROCOCCUS FAECALIS (A)  Final      Susceptibility   Enterococcus faecalis - MIC*    AMPICILLIN <=2 SENSITIVE Sensitive     LEVOFLOXACIN 2 SENSITIVE Sensitive     NITROFURANTOIN <=16 SENSITIVE Sensitive     VANCOMYCIN 1 SENSITIVE Sensitive     * >=100,000 COLONIES/mL ENTEROCOCCUS FAECALIS  Culture, blood (routine x 2)     Status: None (Preliminary result)   Collection Time: 02/18/16  4:45 AM  Result Value Ref Range Status   Specimen Description BLOOD LEFT ARM  Final   Special Requests BOTTLES DRAWN AEROBIC AND ANAEROBIC 5ML  Final   Culture NO GROWTH 3 DAYS  Final   Report Status PENDING  Incomplete  Culture, blood (routine x 2)     Status: None (Preliminary result)   Collection Time: 02/18/16  4:57 AM  Result Value Ref Range Status   Specimen Description BLOOD RIGHT HAND  Final   Special Requests BOTTLES DRAWN AEROBIC AND ANAEROBIC 4ML  Final   Culture NO GROWTH 3 DAYS  Final   Report Status PENDING  Incomplete    Radiology Reports Ct Abdomen Pelvis Wo Contrast  Result Date: 02/16/2016 CLINICAL DATA:  82 year old male with abdominal pain and fever. EXAM: CT ABDOMEN AND PELVIS WITHOUT CONTRAST TECHNIQUE: Multidetector CT imaging of the abdomen and pelvis was performed following the standard protocol without IV contrast. COMPARISON:  CT the abdomen and pelvis 07/15/2015. FINDINGS: Lower chest: Areas of mild cylindrical bronchiectasis and scarring are noted in the lower lobes of the lungs bilaterally. Aortic atherosclerosis in  the distal descending thoracic aorta. Small hiatal hernia. Hepatobiliary:  No definite cystic or solid hepatic lesions are identified on today's noncontrast CT examination. Status post cholecystectomy. Pancreas: No definite pancreatic mass or peripancreatic inflammatory changes on today's noncontrast CT examination. Spleen: Unremarkable. Adrenals/Urinary Tract: Again noted are numerous low-attenuation lesions scattered throughout both kidneys, incompletely characterized on today's noncontrast CT examination, but the majority of these were previously characterized as simple cysts. No hydroureteronephrosis. Urinary bladder is unremarkable in appearance. Bilateral adrenal glands are normal in appearance. Stomach/Bowel: The unenhanced appearance of the stomach is normal. There is no pathologic dilatation of small bowel or colon. There is a short segment of mid to distal small bowel which extends into a small supraumbilical ventral hernia. Several colonic diverticulae are noted, without surrounding inflammatory changes to suggest an acute diverticulitis at this time. Status post appendectomy. Vascular/Lymphatic: Aortic atherosclerosis, without definite aneurysm in the abdominal or pelvic vasculature. No lymphadenopathy noted in the abdomen or pelvis. Reproductive: Prostate gland and seminal vesicles are unremarkable in appearance. Multiple serpiginous structures in the visualized portions of the scrotum bilaterally, suggestive of large varicoceles. Other: No significant volume of ascites.  No pneumoperitoneum. Musculoskeletal: There are no aggressive appearing lytic or blastic lesions noted in the visualized portions of the skeleton. IMPRESSION: 1. No acute findings to account for the patient's symptoms. 2. Mild colonic diverticulosis without evidence of acute diverticulitis at this time. 3. Small supraumbilical ventral hernia containing a short segment of mid to distal small bowel, without evidence of incarceration or  obstruction at this time. 4. Aortic atherosclerosis. 5. Multiple large low-attenuation lesions in the kidneys bilaterally, similar to the prior study, previously characterized as cysts. 6. Additional incidental findings, as above. Electronically Signed   By: Vinnie Langton M.D.   On: 02/16/2016 15:40   Dg Chest 2 View  Result Date: 02/16/2016 CLINICAL DATA:  Fever, pain EXAM: CHEST  2 VIEW COMPARISON:  07/15/2015 FINDINGS: Cardiomediastinal silhouette is stable. No infiltrate or pleural effusion. No pulmonary edema. Stable mild perihilar chronic bronchitic changes. Atherosclerotic calcifications of thoracic aorta. Mild degenerative changes thoracic spine. IMPRESSION: No active disease.  Stable perihilar chronic bronchitic changes. Electronically Signed   By: Lahoma Crocker M.D.   On: 02/16/2016 14:38    Time Spent in minutes  25   Louellen Molder M.D on 02/21/2016 at 1:12 PM  Between 7am to 7pm - Pager - [phone]  After 7pm go to [email or web address] - password [facility]  [facility] -  Office  [phone]

## 2016-02-23 LAB — CULTURE, BLOOD (ROUTINE X 2)
CULTURE: NO GROWTH
Culture: NO GROWTH

## 2016-04-13 ENCOUNTER — Ambulatory Visit: Payer: Medicare Other | Attending: Family Medicine | Admitting: Physical Therapy

## 2016-04-13 ENCOUNTER — Encounter: Payer: Self-pay | Admitting: Physical Therapy

## 2016-04-13 DIAGNOSIS — R262 Difficulty in walking, not elsewhere classified: Secondary | ICD-10-CM | POA: Insufficient documentation

## 2016-04-13 DIAGNOSIS — Z9181 History of falling: Secondary | ICD-10-CM | POA: Insufficient documentation

## 2016-04-13 DIAGNOSIS — M6281 Muscle weakness (generalized): Secondary | ICD-10-CM | POA: Insufficient documentation

## 2016-04-13 DIAGNOSIS — R296 Repeated falls: Secondary | ICD-10-CM | POA: Diagnosis present

## 2016-04-13 NOTE — Therapy (Signed)
Jose Kennedy, [name], [address] Phone: [phone]   Fax:  [phone]  Physical Therapy Evaluation  Patient Details  Name: Jose Kennedy MRN: [id number] Date of Birth: 03/24/1928 Referring Provider: Dr. Reesa Chew  Encounter Date: 04/13/2016      PT End of Session - 04/13/16 1348    Visit Number 1   Date for PT Re-Evaluation 06/15/16   PT Start Time 0200   PT Stop Time 0250   PT Time Calculation (min) 50 min   Equipment Utilized During Treatment Other (comment)  single point hurry cane   Activity Tolerance Patient tolerated treatment well   Behavior During Therapy [facility] for tasks assessed/performed      Past Medical History:  Diagnosis Date  . Coronary artery disease   . Esophageal spasm   . Esophageal ulcer    "was hospitalized 2-3 times for bleeding; no problems in the last couple years" (07/19/2015)  . GERD (gastroesophageal reflux disease)    "severe" (07/19/2015)  . Heart murmur   . High cholesterol   . History of stomach ulcers    "right as you go into the stomach" (07/19/2015)  . Hypertension   . Laryngeal dystonia    chronic, appears as it he's dry heaving  . Stroke [facility]) 1991   "gait is a little different since" (07/19/2015)  . Type II diabetes mellitus (HCC)    insulin dependent    Past Surgical History:  Procedure Laterality Date  . APPENDECTOMY    . BACK SURGERY    . CATARACT EXTRACTION W/ INTRAOCULAR LENS IMPLANT Right   . ESOPHAGOGASTRODUODENOSCOPY  08/26/2011   Procedure: ESOPHAGOGASTRODUODENOSCOPY (EGD);  Surgeon: Wonda Horner, MD;  Location: [facility] ENDOSCOPY;  Service: Endoscopy;  Laterality: N/A;  . ESOPHAGOGASTRODUODENOSCOPY N/A 09/20/2013   Procedure: ESOPHAGOGASTRODUODENOSCOPY (EGD);  Surgeon: Jeryl Columbia, MD;  Location: [facility] ENDOSCOPY;  Service: Endoscopy;  Laterality: N/A;  . LAPAROSCOPIC CHOLECYSTECTOMY    . LARYNX SURGERY     nodule  . LUMBAR DISC SURGERY      There  were no vitals filed for this visit.       Subjective Assessment - 04/13/16 1301    Subjective Pt is coming to physical therapy due to repeated falls over the past 6 months. Pt and son both think that the falls have been caused by weakness in the leg muscles. Pt is walking with a 3 pt cane and he states he has been wlaking with it "for a long time". In the patient's terms, he "misjudges the chair" and this is when he has experienced his falls.   Patient is accompained by: Family member   Limitations Walking;Sitting;Standing;House hold activities   How long can you walk comfortably? 5 minutes   Currently in Pain? No/denies            [facility] PT Assessment - 04/13/16 0001      Assessment   Medical Diagnosis High fall risk   Referring Provider Dr. Reesa Chew   Prior Therapy yes     Precautions   Precautions None     Balance Screen   Has the patient fallen in the past 6 months Yes   How many times? 3   Has the patient had a decrease in activity level because of a fear of falling?  Yes   Is the patient reluctant to leave their home because of a fear of falling?  No     Home Environment  Living Environment Private residence   Living Arrangements Children   Type of [place] to enter   Entrance Stairs-Number of Steps 6   Entrance Stairs-Rails Left;Right   Home Layout Multi-level   Additional Comments "Doesnt go out much"     Prior Function   Level of Independence Requires assistive device for independence   Vocation Retired   Leisure TV, fishing     ROM / Strength   AROM / PROM / Arts administrator   Strength Assessment Site Hip;Knee;Ankle   Right/Left Hip Right;Left   Right Hip Flexion 4/5   Right Hip ABduction 4+/5   Right Hip ADduction 5/5   Left Hip Flexion 4/5   Left Hip ABduction 4+/5   Left Hip ADduction 5/5   Right/Left Knee Right;Left   Right Knee Flexion 4-/5   Right Knee Extension 5/5   Left Knee Flexion 4-/5   Left Knee  Extension 5/5   Right/Left Ankle Right;Left   Right Ankle Dorsiflexion 4+/5   Right Ankle Plantar Flexion 4+/5   Left Ankle Dorsiflexion 4-/5   Left Ankle Plantar Flexion 4+/5     Ambulation/Gait   Ambulation/Gait Yes   Gait Comments right side lean, AD in right hand, step to pattern, decreased step length     Balance   Balance Assessed Yes     Standardized Balance Assessment   Standardized Balance Assessment Berg Balance Test;Timed Up and Go Test;Five Times Sit to Stand   Five times sit to stand comments  38 secs, useing arm rails     Berg Balance Test   Sit to Stand Able to stand  independently using hands   Standing Unsupported Able to stand safely 2 minutes   Sitting with Back Unsupported but Feet Supported on Floor or Stool Able to sit safely and securely 2 minutes   Stand to Sit Controls descent by using hands   Transfers Able to transfer safely, definite need of hands   Standing Unsupported with Eyes Closed Able to stand 10 seconds safely   Standing Ubsupported with Feet Together Able to place feet together independently and stand 1 minute safely   From Standing, Reach Forward with Outstretched Arm Can reach confidently >25 cm (10")   From Standing Position, Pick up Object from Floor Able to pick up shoe safely and easily  felt a little unsteady   From Standing Position, Turn to Look Behind Over each Shoulder Looks behind one side only/other side shows less weight shift   Turn 360 Degrees Able to turn 360 degrees safely but slowly   Standing Unsupported, Alternately Place Feet on Step/Stool Needs assistance to keep from falling or unable to try   Standing Unsupported, One Foot in Front Able to take small step independently and hold 30 seconds   Standing on One Leg Unable to try or needs assist to prevent fall   Total Score 40     Timed Up and Go Test   TUG Normal TUG   Normal TUG (seconds) 53                   [facility] Adult PT Treatment/Exercise - 04/13/16  0001      Exercises   Exercises Knee/Hip;Ankle;Lumbar     Knee/Hip Exercises: Aerobic   Nustep lvl 3, 6 min                PT Education - 04/13/16 1347    Education provided  Yes   Education Details Pt educated on safely sitting down in chair: making sure back of legs are on chair and reaching back to find arms of chair before sitting down.   Person(s) Educated Patient;Child(ren)   Methods Explanation;Verbal cues   Comprehension Verbalized understanding;Returned demonstration          PT Short Term Goals - 04/13/16 1413      PT SHORT TERM GOAL #1   Title Pt will demonstrate compliance with HEP.   Time 2   Period Weeks   Status New           PT Long Term Goals - 04/13/16 1414      PT LONG TERM GOAL #1   Title Pt will be able to to ambulate with step through pattern in order to show imporved LE strength and mobility.   Time 8   Period Weeks   Status New     PT LONG TERM GOAL #2   Title Pt will improve Berg balance score to >46 in order to decrease risk of fall.   Time 8   Period Weeks   Status New     PT LONG TERM GOAL #3   Title Pt will improve LE strength to at least 4+/5 in order to improve mobility and balance.   Time 8   Period Weeks   Status New     PT LONG TERM GOAL #4   Title Pt will show improvements with 5x sit to stand and TUG in order to demonstrate imporved strength in the LEs.   Time 8   Period Weeks   Status New               Plan - 04/13/16 1402    Clinical Impression Statement Pt presents with decreased LE strength, specifically with hip flexion, knee flexion and ankle dorsiflexion. Pt also has decreased activity tolerance and increased fatigue due to inactive lifestyle. Pt was able to complete all objective tests requested however there were certain aspects of the Berg balance test that she was not able to perform. Pt demonstrates  decreased safety awareness with stand to sit and tends to try and sit down prematurely which  has also lead to the falls he has expreienced. Pt was educated to make sure his legs were touching the seat of the chair and to reach back and find the arms of the chair prior to sitting down. Pt was able to perform 6 minutes on the NuStep at the end of treatment however he requested the wheelchair to go out to the car due to fatigue.    Rehab Potential Good   PT Frequency 2x / week   PT Duration 8 weeks   PT Treatment/Interventions ADLs/Self Care Home Management;Cryotherapy;Moist Heat;Gait training;Stair training;Functional mobility training;Electrical Stimulation;Therapeutic activities;Therapeutic exercise;Balance training;Neuromuscular re-education;Patient/family education;Manual techniques;Passive range of motion;Energy conservation   PT Next Visit Plan LE stretching, strengthening, balance training, aerobic training   PT Home Exercise Plan LE strengthening, stretching   Consulted and Agree with Plan of Care Patient;Family member/caregiver      Patient will benefit from skilled therapeutic intervention in order to improve the following deficits and impairments:  Abnormal gait, Decreased activity tolerance, Decreased balance, Decreased mobility, Decreased endurance, Decreased coordination, Decreased range of motion, Decreased safety awareness, Decreased strength, Hypomobility, Difficulty walking, Impaired flexibility, Postural dysfunction, Improper body mechanics  Visit Diagnosis: Difficulty in walking, not elsewhere classified  Muscle weakness (generalized)  History of falling  Repeated falls  Problem List Patient Active Problem List   Diagnosis Date Noted  . Bacteremia 02/19/2016  . Delirium 02/16/2016  . UTI (urinary tract infection) 02/16/2016  . Acute encephalopathy 07/19/2015  . Chronic diastolic CHF (congestive heart failure) ([place]) 07/19/2015  . Acute confusion   . Hyponatremia 07/13/2015  . Acute confusional state of metabolic origin [id number]  . Diabetes mellitus  with diabetic nephropathy ([place]) 07/13/2015  . Anemia of chronic disease 07/13/2015  . CKD (chronic kidney disease) stage 3, GFR 30-59 ml/min 07/29/2013  . Coronary artery disease     Toy Baker, SPT 04/13/2016, 2:21 PM  [address] [place], [name], [address] Phone: [phone]   Fax:  [phone]  Name: Jose Kennedy MRN: [id number] Date of Birth: 1927/09/23

## 2016-04-17 ENCOUNTER — Ambulatory Visit: Payer: Medicare Other | Admitting: Physical Therapy

## 2016-04-17 ENCOUNTER — Encounter: Payer: Self-pay | Admitting: Physical Therapy

## 2016-04-17 DIAGNOSIS — R262 Difficulty in walking, not elsewhere classified: Secondary | ICD-10-CM | POA: Diagnosis not present

## 2016-04-17 NOTE — Therapy (Signed)
[address] [place], [name], [address] Phone: [phone]   Fax:  [phone]  Physical Therapy Treatment  Patient Details  Name: Jose Kennedy MRN: [id number] Date of Birth: 07-07-1927 Referring Provider: Dr. Reesa Chew  Encounter Date: 04/17/2016      PT End of Session - 04/17/16 0919    Visit Number 2   Date for PT Re-Evaluation 06/15/16   PT Start Time 0850   PT Stop Time 0930   PT Time Calculation (min) 40 min      Past Medical History:  Diagnosis Date  . Coronary artery disease   . Esophageal spasm   . Esophageal ulcer    "was hospitalized 2-3 times for bleeding; no problems in the last couple years" (07/19/2015)  . GERD (gastroesophageal reflux disease)    "severe" (07/19/2015)  . Heart murmur   . High cholesterol   . History of stomach ulcers    "right as you go into the stomach" (07/19/2015)  . Hypertension   . Laryngeal dystonia    chronic, appears as it he's dry heaving  . Stroke [facility]) 1991   "gait is a little different since" (07/19/2015)  . Type II diabetes mellitus (HCC)    insulin dependent    Past Surgical History:  Procedure Laterality Date  . APPENDECTOMY    . BACK SURGERY    . CATARACT EXTRACTION W/ INTRAOCULAR LENS IMPLANT Right   . ESOPHAGOGASTRODUODENOSCOPY  08/26/2011   Procedure: ESOPHAGOGASTRODUODENOSCOPY (EGD);  Surgeon: Wonda Horner, MD;  Location: [facility] ENDOSCOPY;  Service: Endoscopy;  Laterality: N/A;  . ESOPHAGOGASTRODUODENOSCOPY N/A 09/20/2013   Procedure: ESOPHAGOGASTRODUODENOSCOPY (EGD);  Surgeon: Jeryl Columbia, MD;  Location: [facility] ENDOSCOPY;  Service: Endoscopy;  Laterality: N/A;  . LAPAROSCOPIC CHOLECYSTECTOMY    . LARYNX SURGERY     nodule  . LUMBAR DISC SURGERY      There were no vitals filed for this visit.      Subjective Assessment - 04/17/16 0858    Subjective pt amb in very slow and unsafe with 3 pt SPC, pt switched hands with shuffled step too gait   Currently in Pain? No/denies                         [facility] Adult PT Treatment/Exercise - 04/17/16 0001      Knee/Hip Exercises: Aerobic   Nustep L 4 6 min     Knee/Hip Exercises: Standing   Other Standing Knee Exercises 3# hip flex,ext,abd and marching 10 times each with RW     Knee/Hip Exercises: Seated   Other Seated Knee/Hip Exercises red tband around knees 15 reps hip abd and hip flex    Abduction/Adduction  Strengthening;15 reps  red tband around ankle   Abd/Adduction Limitations hip add ball squeeze 15 times   Sit to Sand 2 sets;5 reps;without UE support  min A working on safe tech                  PT Short Term Goals - 04/13/16 1413      PT SHORT TERM GOAL #1   Title Pt will demonstrate compliance with HEP.   Time 2   Period Weeks   Status New           PT Long Term Goals - 04/13/16 1414      PT LONG TERM GOAL #1   Title Pt will be able to to ambulate with step  through pattern in order to show imporved LE strength and mobility.   Time 8   Period Weeks   Status New     PT LONG TERM GOAL #2   Title Pt will improve Berg balance score to >46 in order to decrease risk of fall.   Time 8   Period Weeks   Status New     PT LONG TERM GOAL #3   Title Pt will improve LE strength to at least 4+/5 in order to improve mobility and balance.   Time 8   Period Weeks   Status New     PT LONG TERM GOAL #4   Title Pt will show improvements with 5x sit to stand and TUG in order to demonstrate imporved strength in the LEs.   Time 8   Period Weeks   Status New               Plan - 04/17/16 0920    Clinical Impression Statement pt tolerated ther ex well with several rest breaks. Pt requires assistance for safety with transfers,sit to stand and standing ex d/t balance and poor safety   PT Next Visit Plan LE stretching, strengthening, balance training, aerobic training      Patient will benefit from skilled therapeutic intervention in  order to improve the following deficits and impairments:  Abnormal gait, Decreased activity tolerance, Decreased balance, Decreased mobility, Decreased endurance, Decreased coordination, Decreased range of motion, Decreased safety awareness, Decreased strength, Hypomobility, Difficulty walking, Impaired flexibility, Postural dysfunction, Improper body mechanics  Visit Diagnosis: Difficulty in walking, not elsewhere classified     Problem List Patient Active Problem List   Diagnosis Date Noted  . Bacteremia 02/19/2016  . Delirium 02/16/2016  . UTI (urinary tract infection) 02/16/2016  . Acute encephalopathy 07/19/2015  . Chronic diastolic CHF (congestive heart failure) ([place]) 07/19/2015  . Acute confusion   . Hyponatremia 07/13/2015  . Acute confusional state of metabolic origin [id number]  . Diabetes mellitus with diabetic nephropathy ([place]) 07/13/2015  . Anemia of chronic disease 07/13/2015  . CKD (chronic kidney disease) stage 3, GFR 30-59 ml/min 07/29/2013  . Coronary artery disease     Caia Lofaro,ANGIE PTA 04/17/2016, 9:23 AM  [place], [name], [address] Phone: [phone]   Fax:  [phone]  Name: Jose Kennedy MRN: [id number] Date of Birth: Oct 24, 1927

## 2016-04-23 ENCOUNTER — Ambulatory Visit: Payer: Medicare Other | Admitting: Physical Therapy

## 2016-04-23 ENCOUNTER — Encounter: Payer: Self-pay | Admitting: Physical Therapy

## 2016-04-23 DIAGNOSIS — R262 Difficulty in walking, not elsewhere classified: Secondary | ICD-10-CM

## 2016-04-23 DIAGNOSIS — Z9181 History of falling: Secondary | ICD-10-CM

## 2016-04-23 DIAGNOSIS — M6281 Muscle weakness (generalized): Secondary | ICD-10-CM

## 2016-04-23 DIAGNOSIS — R296 Repeated falls: Secondary | ICD-10-CM

## 2016-04-23 NOTE — Therapy (Addendum)
[address] [place], [name], [address] Phone: [phone]   Fax:  [phone]  Physical Therapy Treatment  Patient Details  Name: Jose Kennedy MRN: [id number] Date of Birth: 1928-01-08 Referring Provider: Dr. Reesa Chew  Encounter Date: 04/23/2016      PT End of Session - 04/23/16 1013    Visit Number 3   Date for PT Re-Evaluation 06/15/16   PT Start Time 0930   PT Stop Time 1013   PT Time Calculation (min) 43 min   Activity Tolerance Patient tolerated treatment well   Behavior During Therapy [facility] for tasks assessed/performed      Past Medical History:  Diagnosis Date  . Coronary artery disease   . Esophageal spasm   . Esophageal ulcer    "was hospitalized 2-3 times for bleeding; no problems in the last couple years" (07/19/2015)  . GERD (gastroesophageal reflux disease)    "severe" (07/19/2015)  . Heart murmur   . High cholesterol   . History of stomach ulcers    "right as you go into the stomach" (07/19/2015)  . Hypertension   . Laryngeal dystonia    chronic, appears as it he's dry heaving  . Stroke [facility]) 1991   "gait is a little different since" (07/19/2015)  . Type II diabetes mellitus (HCC)    insulin dependent    Past Surgical History:  Procedure Laterality Date  . APPENDECTOMY    . BACK SURGERY    . CATARACT EXTRACTION W/ INTRAOCULAR LENS IMPLANT Right   . ESOPHAGOGASTRODUODENOSCOPY  08/26/2011   Procedure: ESOPHAGOGASTRODUODENOSCOPY (EGD);  Surgeon: Wonda Horner, MD;  [facility] ENDOSCOPY;  Service: Endoscopy;  Laterality: N/A;  . ESOPHAGOGASTRODUODENOSCOPY N/A 09/20/2013   Procedure: ESOPHAGOGASTRODUODENOSCOPY (EGD);  Surgeon: Jeryl Columbia, MD;  Location[facility] ENDOSCOPY;  Service: Endoscopy;  Laterality: N/A;  . LAPAROSCOPIC CHOLECYSTECTOMY    . LARYNX SURGERY     nodule  . LUMBAR DISC SURGERY      There were no vitals filed for this visit.      Subjective Assessment - 04/23/16  0931    Subjective "Aint no need to complain"   Currently in Pain? No/denies   Multiple Pain Sites No                         [facility] Adult PT Treatment/Exercise - 04/23/16 0001      Lumbar Exercises: Standing   Other Standing Lumbar Exercises Standing forward reaches yellow ball 2x10    Other Standing Lumbar Exercises seated  Tband rows blue 2x15     Knee/Hip Exercises: Aerobic   Nustep L 4 6 min     Knee/Hip Exercises: Standing   Other Standing Knee Exercises 2# hip flex,ext,abd and marching 2x10 times each with RW     Knee/Hip Exercises: Seated   Long Arc Quad 2 sets;10 reps   Long Arc Quad Weight 3 lbs.   Marching Limitations 2x10   Marching Weights 3 lbs.   Abduction/Adduction  Strengthening;15 reps  blue band    Abd/Adduction Limitations hip add with manual resistance 2x15   Sit to Sand 2 sets;5 reps;without UE support  some posterior lean while standing                   PT Short Term Goals - 04/13/16 1413      PT SHORT TERM GOAL #1   Title Pt will demonstrate compliance with HEP.  Time 2   Period Weeks   Status New           PT Long Term Goals - 04/23/16 1017      PT LONG TERM GOAL #1   Title Pt will be able to to ambulate with step through pattern in order to show imporved LE strength and mobility.   Status On-going     PT LONG TERM GOAL #2   Title Pt will improve Berg balance score to >46 in order to decrease risk of fall.   Status On-going     PT LONG TERM GOAL #3   Title Pt will improve LE strength to at least 4+/5 in order to improve mobility and balance.   Status On-going     PT LONG TERM GOAL #4   Title Pt will show improvements with 5x sit to stand and TUG in order to demonstrate imporved strength in the LEs.   Status On-going               Plan - 04/23/16 1014    Clinical Impression Statement Again pt needed several rest breaks to complete today's exercises. Pt tolerated increased repetitions with  lighter weight. Some decrease safety with sit to stands requiring min assist at times to remain safe.    PT Frequency 2x / week   PT Duration 8 weeks   PT Treatment/Interventions ADLs/Self Care Home Management;Cryotherapy;Moist Heat;Gait training;Stair training;Functional mobility training;Electrical Stimulation;Therapeutic activities;Therapeutic exercise;Balance training;Neuromuscular re-education;Patient/family education;Manual techniques;Passive range of motion;Energy conservation   PT Next Visit Plan LE stretching, strengthening, balance training, aerobic training   PT Home Exercise Plan LE strengthening, stretching      Patient will benefit from skilled therapeutic intervention in order to improve the following deficits and impairments:  Abnormal gait, Decreased activity tolerance, Decreased balance, Decreased mobility, Decreased endurance, Decreased coordination, Decreased range of motion, Decreased safety awareness, Decreased strength, Hypomobility, Difficulty walking, Impaired flexibility, Postural dysfunction, Improper body mechanics  Visit Diagnosis: Muscle weakness (generalized)  Difficulty in walking, not elsewhere classified  History of falling  Repeated falls     Problem List Patient Active Problem List   Diagnosis Date Noted  . Bacteremia 02/19/2016  . Delirium 02/16/2016  . UTI (urinary tract infection) 02/16/2016  . Acute encephalopathy 07/19/2015  . Chronic diastolic CHF (congestive heart failure) ([place]) 07/19/2015  . Acute confusion   . Hyponatremia 07/13/2015  . Acute confusional state of metabolic origin 41/66/0630  . Diabetes mellitus with diabetic nephropathy ([place]) 07/13/2015  . Anemia of chronic disease 07/13/2015  . CKD (chronic kidney disease) stage 3, GFR 30-59 ml/min 07/29/2013  . Coronary artery disease     PHYSICAL THERAPY DISCHARGE SUMMARY  Visits from Start of Care: 3 Plan: Patient agrees to discharge.  Patient goals were met. Patient is  being discharged due to not returning since the last visit.  ?????       Scot Jun, PTA 04/23/2016, 10:18 AM  [address] [place], [name], [address] Phone: [phone]   Fax:  [phone]  Name: Jose Kennedy MRN: [id number] Date of Birth: 1928/01/14

## 2016-09-17 ENCOUNTER — Emergency Department (HOSPITAL_COMMUNITY): Payer: Medicare Other

## 2016-09-17 ENCOUNTER — Encounter (HOSPITAL_COMMUNITY): Payer: Self-pay | Admitting: Emergency Medicine

## 2016-09-17 ENCOUNTER — Emergency Department (HOSPITAL_COMMUNITY)
Admission: EM | Admit: 2016-09-17 | Discharge: 2016-09-17 | Disposition: A | Discharge status: Home / Self Care | Payer: Medicare Other | Attending: Emergency Medicine | Admitting: Emergency Medicine

## 2016-09-17 DIAGNOSIS — Z794 Long term (current) use of insulin: Secondary | ICD-10-CM | POA: Diagnosis not present

## 2016-09-17 DIAGNOSIS — Z87891 Personal history of nicotine dependence: Secondary | ICD-10-CM | POA: Diagnosis not present

## 2016-09-17 DIAGNOSIS — K209 Esophagitis, unspecified without bleeding: Secondary | ICD-10-CM

## 2016-09-17 DIAGNOSIS — I2510 Atherosclerotic heart disease of native coronary artery without angina pectoris: Secondary | ICD-10-CM | POA: Insufficient documentation

## 2016-09-17 DIAGNOSIS — Z8673 Personal history of transient ischemic attack (TIA), and cerebral infarction without residual deficits: Secondary | ICD-10-CM | POA: Insufficient documentation

## 2016-09-17 DIAGNOSIS — E119 Type 2 diabetes mellitus without complications: Secondary | ICD-10-CM | POA: Diagnosis not present

## 2016-09-17 DIAGNOSIS — I10 Essential (primary) hypertension: Secondary | ICD-10-CM | POA: Diagnosis not present

## 2016-09-17 DIAGNOSIS — Z79899 Other long term (current) drug therapy: Secondary | ICD-10-CM | POA: Insufficient documentation

## 2016-09-17 DIAGNOSIS — R1310 Dysphagia, unspecified: Secondary | ICD-10-CM | POA: Diagnosis present

## 2016-09-17 DIAGNOSIS — Z7982 Long term (current) use of aspirin: Secondary | ICD-10-CM | POA: Diagnosis not present

## 2016-09-17 LAB — BASIC METABOLIC PANEL
Anion gap: 6 (ref 5–15)
BUN: 18 mg/dL (ref 6–20)
CALCIUM: 8.7 mg/dL — AB (ref 8.9–10.3)
CO2: 33 mmol/L — ABNORMAL HIGH (ref 22–32)
Chloride: 95 mmol/L — ABNORMAL LOW (ref 101–111)
Creatinine, Ser: 1.2 mg/dL (ref 0.61–1.24)
GFR calc Af Amer: >60 mL/min (ref >60)
GFR, EST NON AFRICAN AMERICAN: 53 mL/min — AB (ref >60)
GLUCOSE: 111 mg/dL — AB (ref 65–99)
Potassium: 3.3 mmol/L — ABNORMAL LOW (ref 3.5–5.1)
Sodium: 134 mmol/L — ABNORMAL LOW (ref 135–145)

## 2016-09-17 LAB — I-STAT TROPONIN, ED: Troponin i, poc: 0 ng/mL (ref 0.00–0.08)

## 2016-09-17 LAB — CBC WITH DIFFERENTIAL/PLATELET
Basophils Absolute: 0 10*3/uL (ref 0.0–0.1)
Basophils Relative: 0 %
EOS PCT: 21 %
Eosinophils Absolute: 1.1 10*3/uL — ABNORMAL HIGH (ref 0.0–0.7)
HCT: 39.2 % (ref 39.0–52.0)
Hemoglobin: 12.2 g/dL — ABNORMAL LOW (ref 13.0–17.0)
Lymphocytes Relative: 23 %
Lymphs Abs: 1.2 10*3/uL (ref 0.7–4.0)
MCH: 26.1 pg (ref 26.0–34.0)
MCHC: 31.1 g/dL (ref 30.0–36.0)
MCV: 83.9 fL (ref 78.0–100.0)
Monocytes Absolute: 0.4 10*3/uL (ref 0.1–1.0)
Monocytes Relative: 7 %
NEUTROS PCT: 49 %
Neutro Abs: 2.6 10*3/uL (ref 1.7–7.7)
PLATELETS: 216 10*3/uL (ref 150–400)
RBC: 4.67 MIL/uL (ref 4.22–5.81)
RDW: 14.1 % (ref 11.5–15.5)
WBC: 5.4 10*3/uL (ref 4.0–10.5)

## 2016-09-17 MED ORDER — ALUM & MAG HYDROXIDE-SIMETH 200-200-20 MG/5ML PO SUSP
30.0000 mL | Freq: Once | ORAL | Status: AC
  Administered 2016-09-17: 30 mL via ORAL
  Filled 2016-09-17: qty 30

## 2016-09-17 MED ORDER — NITROGLYCERIN 0.4 MG SL SUBL
0.4000 mg | SUBLINGUAL_TABLET | Freq: Once | SUBLINGUAL | Status: AC
  Administered 2016-09-17: 0.4 mg via SUBLINGUAL
  Filled 2016-09-17: qty 1

## 2016-09-17 MED ORDER — AMLODIPINE BESYLATE 5 MG PO TABS
10.0000 mg | ORAL_TABLET | Freq: Once | ORAL | Status: AC
  Administered 2016-09-17: 10 mg via ORAL
  Filled 2016-09-17: qty 2

## 2016-09-17 MED ORDER — PANTOPRAZOLE SODIUM 40 MG IV SOLR
40.0000 mg | Freq: Once | INTRAVENOUS | Status: AC
Start: 2016-09-17 — End: 2016-09-17
  Administered 2016-09-17: 40 mg via INTRAVENOUS
  Filled 2016-09-17: qty 40

## 2016-09-17 MED ORDER — LOSARTAN POTASSIUM 50 MG PO TABS
100.0000 mg | ORAL_TABLET | Freq: Once | ORAL | Status: AC
  Administered 2016-09-17: 100 mg via ORAL
  Filled 2016-09-17: qty 2

## 2016-09-17 MED ORDER — ONDANSETRON HCL 4 MG/2ML IJ SOLN
4.0000 mg | Freq: Once | INTRAMUSCULAR | Status: AC
Start: 2016-09-17 — End: 2016-09-17
  Administered 2016-09-17: 4 mg via INTRAVENOUS
  Filled 2016-09-17: qty 2

## 2016-09-17 MED ORDER — HYDROCHLOROTHIAZIDE 25 MG PO TABS
25.0000 mg | ORAL_TABLET | Freq: Once | ORAL | Status: AC
  Administered 2016-09-17: 25 mg via ORAL
  Filled 2016-09-17: qty 1

## 2016-09-17 MED ORDER — GLUCAGON HCL RDNA (DIAGNOSTIC) 1 MG IJ SOLR
1.0000 mg | Freq: Once | INTRAMUSCULAR | Status: AC
Start: 2016-09-17 — End: 2016-09-17
  Administered 2016-09-17: 1 mg via INTRAVENOUS
  Filled 2016-09-17: qty 1

## 2016-09-17 NOTE — ED Notes (Signed)
Patient transported to X-ray 

## 2016-09-17 NOTE — ED Notes (Signed)
ED Provider at bedside. Family at bedside.

## 2016-09-17 NOTE — ED Triage Notes (Signed)
Pt reports feeling like there is something in his throat that started yesterday afternoon. Pt reported that he had some difficulty eating dinner. Pt went to bed hoping it would get better, woke up in the middle of the night and it had gotten worse. Pain 8/10 in throat. Difficulty swallowing. Pt has laryngeal dystonia and cough at baseline. Denies sob, no pain when swallowing.

## 2016-09-17 NOTE — Discharge Instructions (Signed)
Please continue your Zegerid as prescribed as well as your Carafate. He may use over-the-counter Maalox his symptoms continue. Please follow-up with your gastroenterologist if symptoms return or not improving. If you have or develop worsening discomfort, chest pain or shortness of breath, unable to swallow your own saliva, please return to the hospital.

## 2016-09-17 NOTE — ED Provider Notes (Signed)
TIME SEEN: 4:15 AM  CHIEF COMPLAINT: "I feel like something is stuck in my throat"  HPI: Patient is an 83 year old male with history of gastric ulcer, esophageal spasm, GERD, hypertension, hyperlipidemia, laryngeal dystonia, previous stroke who presents to the emergency department with complaints of feeling like something is stuck in his throat yesterday. He states he does not remember eating, swallowing anything that he could have swallowed that caused his symptoms including pills or food. States he is able to swallow his saliva, water and food without difficulty. He denies difficulty breathing. No chest pain or chest discomfort. Patient is hypertensive here in family reports he has been compliant with blood pressure medications. It is not normal for his blood pressure be elevated. No headache, vision changes, numbness, tingling or focal weakness. He denies any nausea or vomiting. States he's had similar symptoms before that resolve on their own. No history of esophageal stricture. Has had previous endoscopy. His gastroenterologist is Dr. Penelope Coop with [facility].  His last endoscopy was in May 2015 and showed a small hiatal hernia, mild distal esophagitis, multiple gastric polyps, esophageal dysmotility, increase GE junction spasm. Also had endoscopy in May 2013 that showed mild esophagitis and gastric polyps.  ROS: See HPI Constitutional: no fever  Eyes: no drainage  ENT: no runny nose   Cardiovascular:  no chest pain  Resp: no SOB  GI: no vomiting GU: no dysuria Integumentary: no rash  Allergy: no hives  Musculoskeletal: no leg swelling  Neurological: no slurred speech ROS otherwise negative  PAST MEDICAL HISTORY/PAST SURGICAL HISTORY:  Past Medical History:  Diagnosis Date  . Coronary artery disease   . Esophageal spasm   . Esophageal ulcer    "was hospitalized 2-3 times for bleeding; no problems in the last couple years" (07/19/2015)  . GERD (gastroesophageal reflux disease)    "severe"  (07/19/2015)  . Heart murmur   . High cholesterol   . History of stomach ulcers    "right as you go into the stomach" (07/19/2015)  . Hypertension   . Laryngeal dystonia    chronic, appears as it he's dry heaving  . Stroke [facility]) 1991   "gait is a little different since" (07/19/2015)  . Type II diabetes mellitus (HCC)    insulin dependent    MEDICATIONS:  Prior to Admission medications   Medication Sig Start Date End Date Taking? Authorizing Provider  amLODipine (NORVASC) 10 MG tablet Take 10 mg by mouth daily.    [provider]  aspirin EC 81 MG tablet Take 81 mg by mouth daily.    [provider]  carvedilol (COREG) 12.5 MG tablet Take 0.5 tablets (6.25 mg total) by mouth 2 (two) times daily with a meal. 02/21/16   Dhungel, Nishant, MD  ciprofloxacin (CIPRO) 500 MG tablet Take 1 tablet (500 mg total) by mouth 2 (two) times daily. 02/20/16   Robbie Lis, MD  docusate sodium (COLACE) 100 MG capsule Take 100 mg by mouth 2 (two) times daily.    [provider]  ferrous sulfate 325 (65 FE) MG tablet Take 325 mg by mouth daily with breakfast.     [provider]  hydrALAZINE (APRESOLINE) 10 MG tablet Take 10 mg by mouth at bedtime.     [provider]  hydrochlorothiazide (HYDRODIURIL) 25 MG tablet Take 25 mg by mouth daily.    [provider]  insulin NPH-insulin regular (NOVOLIN 70/30) (70-30) 100 UNIT/ML injection Inject 10 Units into the skin 2 (two) times daily  with a meal.     [provider]  losartan (COZAAR) 100 MG tablet Take 100 mg by mouth daily.    [provider]  Omeprazole-Sodium Bicarbonate (ZEGERID) 20-1100 MG CAPS capsule Take 1 capsule by mouth daily before breakfast.    [provider]  pantoprazole (PROTONIX) 40 MG tablet Take 40 mg by mouth daily.    [provider]  senna (SENOKOT) 8.6 MG tablet Take 1 tablet by mouth daily as needed for constipation.    [provider]   sucralfate (CARAFATE) 1 G tablet Take 1 g by mouth 4 (four) times daily.    [provider]  Wheat Dextrin (BENEFIBER PO) Take by mouth. Mix 2 teaspoonsful with 6-8 ounces of juice or fluid and drink once a day    [provider]    ALLERGIES:  No Known Allergies  SOCIAL HISTORY:  Social History  Substance Use Topics  . Smoking status: Former Smoker    Packs/day: 0.00    Types: Pipe, Cigars  . Smokeless tobacco: Former Systems developer    Types: Chew     Comment: "quit smoking in the 1970s"  . Alcohol use Yes     Comment: 07/19/2015 "might have a couple drinks a couple times/year"    FAMILY HISTORY: Family History  Problem Relation Age of Onset  . Hypertension Mother   . Stroke Mother   . Hypertension Father   . Stroke Father   . Hypertension Sister   . Diabetes type II Sister   . Hypertension Brother   . Cancer Neg Hx   . CAD Neg Hx     EXAM: BP (!) 205/100 (BP Location: Right Arm)   Pulse 80   Temp 99.1 F (37.3 C) (Oral)   Resp 18   Ht 5\' 11"  (1.803 m)   Wt 86.2 kg (190 lb)   SpO2 96%   BMI 26.50 kg/m  CONSTITUTIONAL: Alert and oriented and responds appropriately to questions. Elderly, chronically ill-appearing, in no distress HEAD: Normocephalic EYES: Conjunctivae clear, pupils appear equal, EOMI ENT: normal nose; moist mucous membranes; No pharyngeal erythema or petechiae, no tonsillar hypertrophy or exudate, no uvular deviation, no unilateral swelling, no trismus or drooling, no muffled voice, normal phonation, no stridor, no dental caries present, no drainable dental abscess noted, no Ludwig's angina, tongue sits flat in the bottom of the mouth, no angioedema, no facial erythema or warmth, no facial swelling; no pain with movement of the neck. NECK: Supple, no meningismus, no nuchal rigidity, no LAD  CARD: RRR; S1 and S2 appreciated; no murmurs, no clicks, no rubs, no gallops RESP: Normal chest excursion without splinting or tachypnea; breath sounds  clear and equal bilaterally; no wheezes, no rhonchi, no rales, no hypoxia or respiratory distress, speaking full sentences ABD/GI: Normal bowel sounds; non-distended; soft, non-tender, no rebound, no guarding, no peritoneal signs, no hepatosplenomegaly BACK:  The back appears normal and is non-tender to palpation, there is no CVA tenderness EXT: Normal ROM in all joints; non-tender to palpation; no edema; normal capillary refill; no cyanosis, no calf tenderness or swelling    SKIN: Normal color for age and race; warm; no rash NEURO: Moves all extremities equally, laryngeal dystonia which causes him to have difficulty speaking intermittently but then speech will become clear which family reports is his baseline, no facial droop PSYCH: The patient's mood and manner are appropriate. Grooming and personal hygiene are appropriate.  MEDICAL DECISION MAKING: Patient here with complaints of feeling like something is  stuck in his esophagus. He is able to swallow his secretions, fluids and food. He does not recall any inciting event. It appears per his records he has had esophageal spasm, gastric ulcer. He is also hypertensive here but denies any chest pain, shortness of breath, headache, neurologic deficits. We'll give him his home doses of morning blood pressure medications. Will treat his symptoms of dysphagia with Maalox, Zofran, Protonix, glucagon and nitroglycerin. Will obtain x-rays of his chest and soft tissues of his neck to evaluate for any sign of retained foreign body although less likely given he does not recall inciting event.   Doubt dissection, ACS.   ED PROGRESS: 7:30 AM  Pt reports feeling better after medications. He's been able to drink without difficulty. Swallowing his own secretions. Blood pressure has improved. Discussed with family that I suspect possible GERD, esophagitis, esophageal spasm, esophageal dysmotility. I do not think that this is a significant fluid bolus given he is able to  swallow and reports feeling better. I feel he is safe to be discharged home and follow-up with his GI doctor. Patient is already on omeprazole and Carafate. Discussed with family that they can use over-the-counter Maalox as needed as well. Patient and family are comfortable with this plan. Have advised him to watch his blood pressure closely at home and take his medications as prescribed. He has a PCP for follow-up.  At this time, I do not feel there is any life-threatening condition present. I have reviewed and discussed all results (EKG, imaging, lab, urine as appropriate) and exam findings with patient/family. I have reviewed nursing notes and appropriate previous records.  I feel the patient is safe to be discharged home without further emergent workup and can continue workup as an outpatient as needed. Discussed usual and customary return precautions. Patient/family verbalize understanding and are comfortable with this plan.  Outpatient follow-up has been provided if needed. All questions have been answered.    EKG Interpretation  Date/Time:  Thursday Sep 17 2016 04:27:06 EDT Ventricular Rate:  75 PR Interval:    QRS Duration: 97 QT Interval:  410 QTC Calculation: 458 R Axis:   62 Text Interpretation:  Sinus rhythm Ventricular premature complex Anterior infarct, old Borderline repolarization abnormality Confirmed by Cheetara Hoge,  DO, Domnic Vantol ([id number]) on 09/17/2016 5:01:47 AM         Ailee Pates, Delice Bison, DO 09/17/16 0768

## 2016-09-17 NOTE — ED Notes (Addendum)
Pt given diet coke per MD.

## 2016-11-05 IMAGING — CT CT ABD-PELV W/ CM
2 of 5 series · 16 of 46 positions shown, 18 images · IV contrast (APPLIED)
Comparison: No comparison CT.

ADDENDUM:
First impression should read:

Periumbilical bowel containing hernia. There are 2 segments of small
bowel entering this hernia. The superior segment is dilated with
abrupt change in caliber suggesting that the hernia is causing
partial small bowel obstruction.
These results will be called to the ordering clinician or
representative by the Radiologist Assistant, and communication
documented in the PACS or zVision Dashboard.
CLINICAL DATA: 87-year-old diabetic hypertensive male with history
of stomach ulcers presenting with nausea and vomiting. Initial
encounter.
EXAM:
CT ABDOMEN AND PELVIS WITH CONTRAST
TECHNIQUE: Multidetector CT imaging of the abdomen and pelvis was performed
using the standard protocol following bolus administration of
intravenous contrast.
CONTRAST:  100mL OMNIPAQUE IOHEXOL 300 MG/ML  SOLN

[Series 2: abd/ pelvis 5.0 i30f 1 · axial · 0.74mm/px · z∈[-462,-82]mm · 13 of 88 slices shown, 15 images]
[im 6/88  soft-tissue]
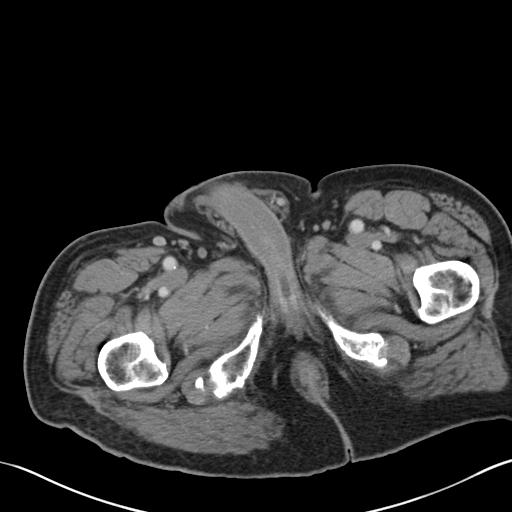
[im 6/88  bone]
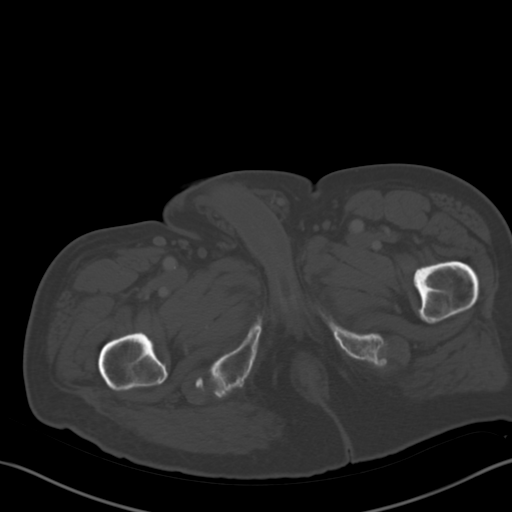
[im 11/88  soft-tissue]
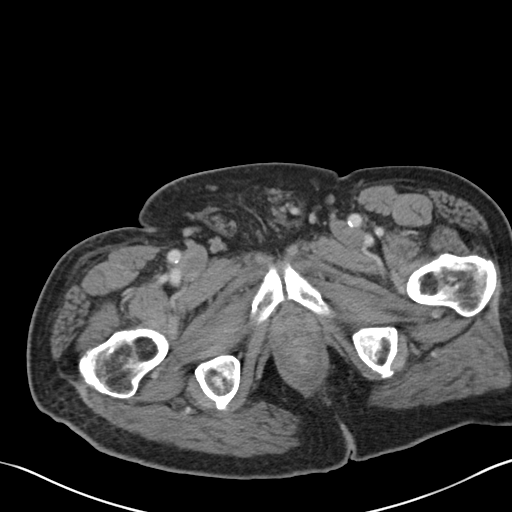
[im 21/88  soft-tissue]
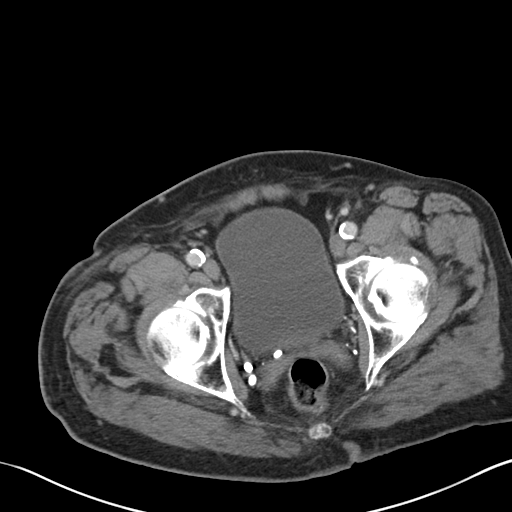
[im 26/88  soft-tissue]
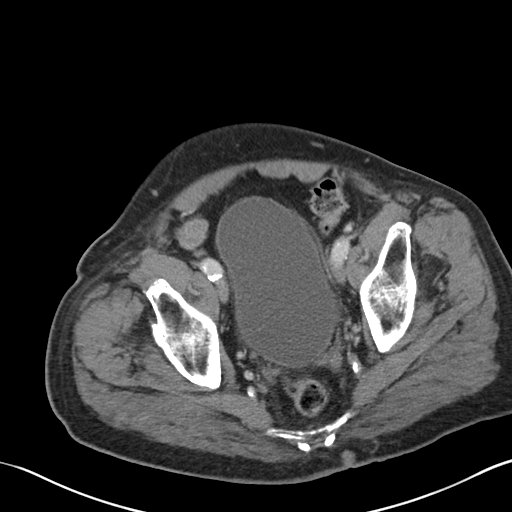
[im 31/88  soft-tissue]
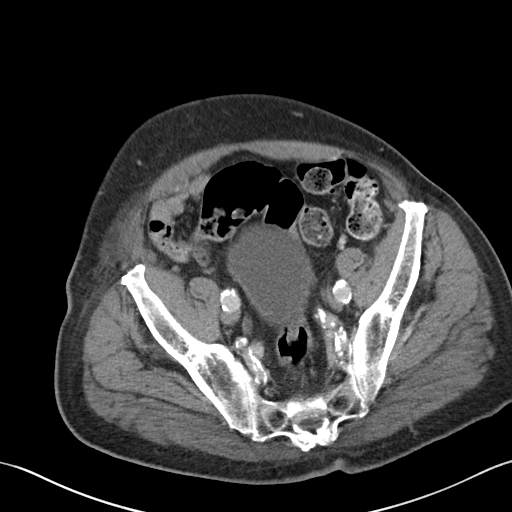
[im 36/88  soft-tissue]
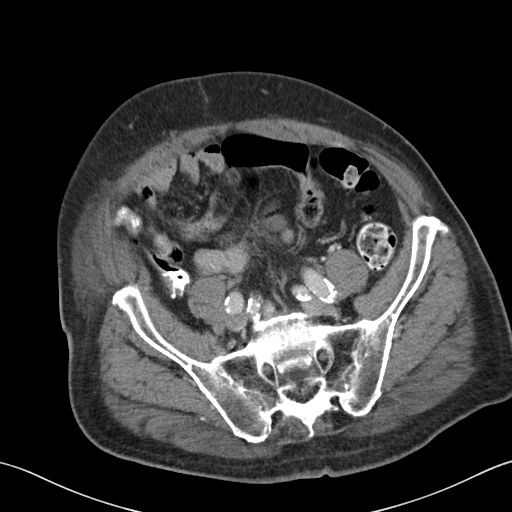
[im 47/88  soft-tissue]
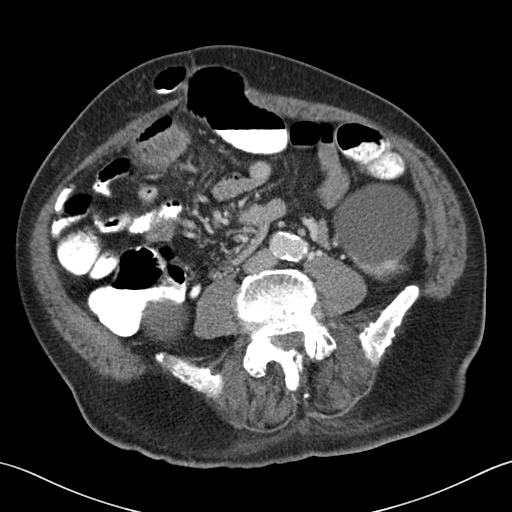
[im 52/88  soft-tissue]
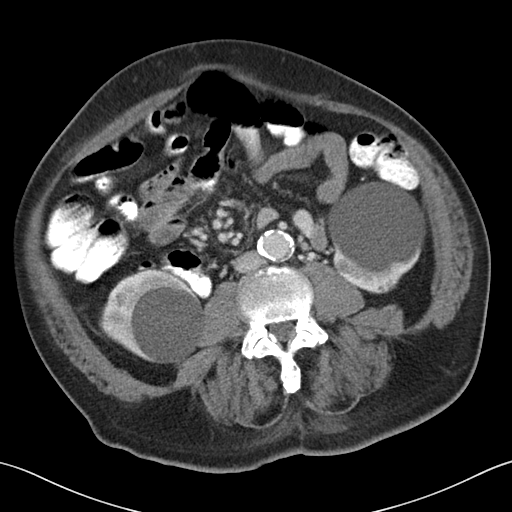
[im 57/88  soft-tissue]
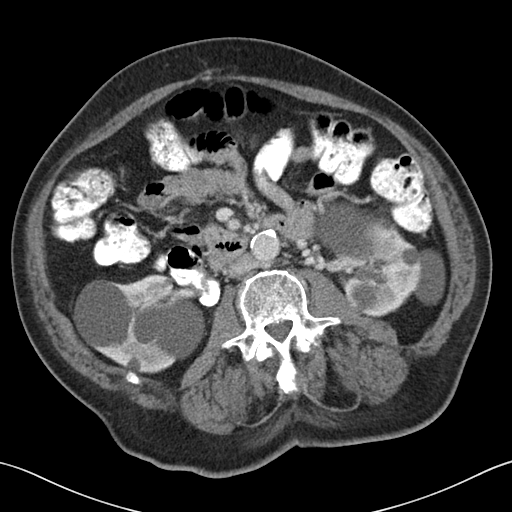
[im 57/88  bone]
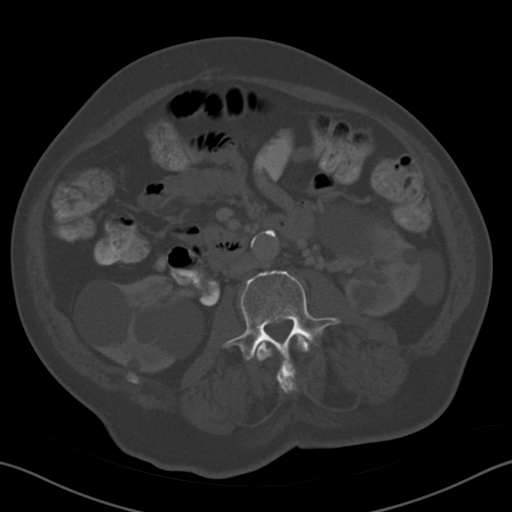
[im 62/88  soft-tissue]
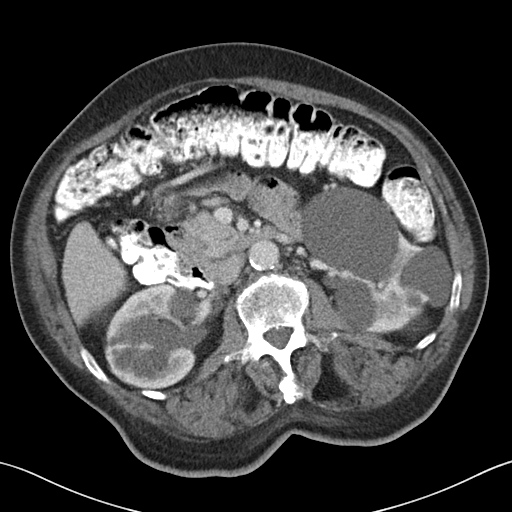
[im 67/88  soft-tissue]
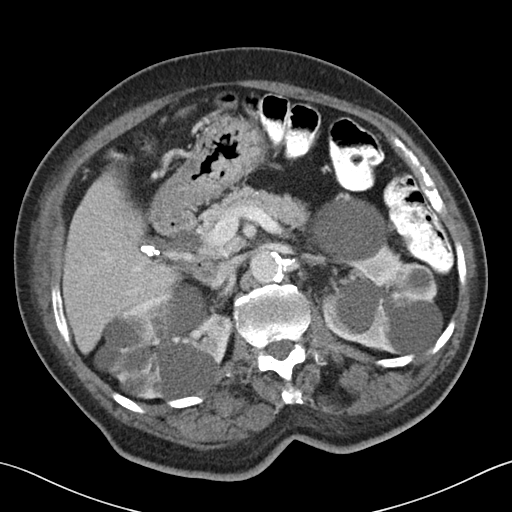
[im 77/88  soft-tissue]
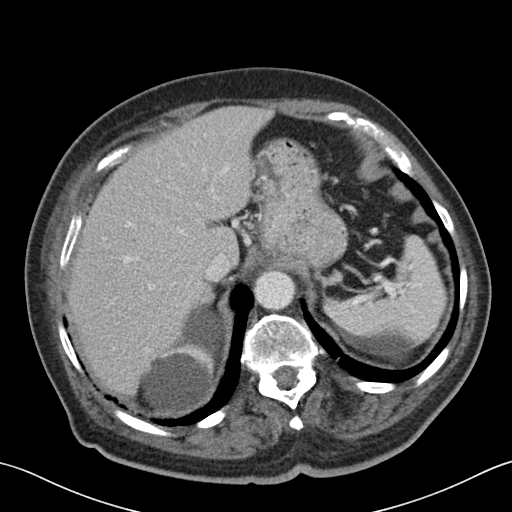
[im 82/88  soft-tissue]
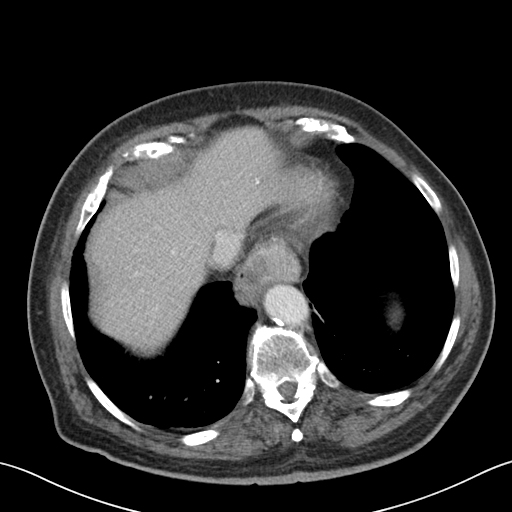

[Series 5: coronal soft tissue · coronal · 0.85mm/px · 3 of 104 slices shown]
[im 35/104  soft-tissue]
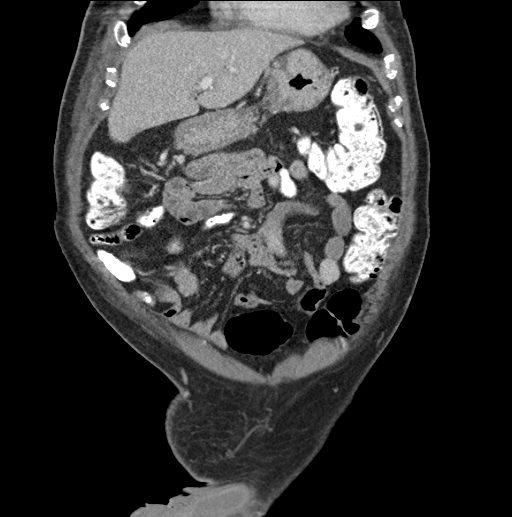
[im 46/104  soft-tissue]
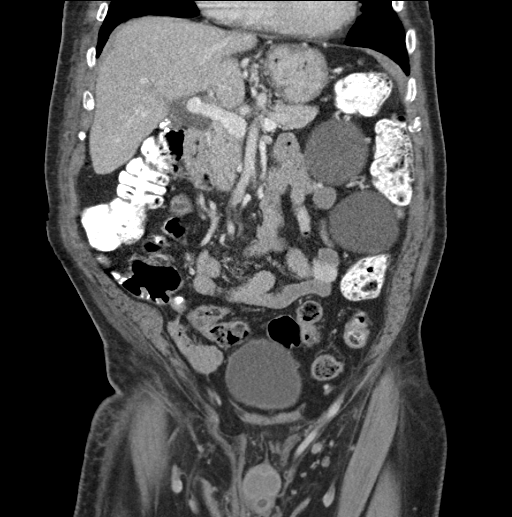
[im 58/104  soft-tissue]
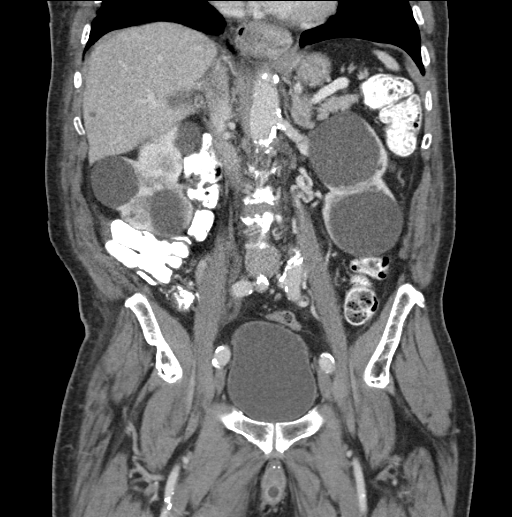

[16 of 46 positions shown; findings below may reference images not displayed]

FINDINGS: Lower chest: Tiny calcified granulomas lung bases with areas of
scarring without worrisome lesion noted.

Hepatobiliary: No masses or other significant abnormality.

Pancreas: No mass, inflammatory changes, or other significant
abnormality.

Spleen: Within normal limits in size and appearance.

Adrenals/Urinary Tract: Multiple bilateral renal cysts measuring up
to 7 cm. Distortion of nonobstructing collecting system. No adrenal
abnormality.

Stomach/Bowel: Under distended stomach with thickened folds and
limited evaluation for the possibility of underlying ulcer. Hiatal
hernia. Periumbilical small bowel containing hernia. There are 2
segments of small bowel entering this hernia with the superior
segment dilated suggesting hernia this may be causing partial
obstruction. History of prior appendectomy. No free air or free
fluid. Fatty containing inguinal hernias.

Vascular/Lymphatic: Prominent calcified plaque aorta and aortic
branches. No high-grade stenosis. Ectasia without focal aneurysm.
Calcified plaque iliac arteries with common iliac artery ectasia
bilaterally. Narrowing origin of the celiac artery and superior
mesenteric artery. No adenopathy.

Reproductive: Slightly heterogeneous lobulated prostate gland.
Clinical and laboratory correlation recommended.

Other: None.

Musculoskeletal: Facet degenerative changes L4-5 and L5-S1. Grade 1
anterior slip L4. Bilateral foraminal narrowing and spinal stenosis.
Bilateral hip joint degenerative changes. Fusion sacroiliac joints.
No worrisome orbital osseous abnormality.
IMPRESSION: Periumbilical small bowel containing hernia. There are 2 segments of
small bowel entering this hernia with the superior segment dilated
suggesting hernia this may be causing partial obstruction.

Under distended stomach with thickened folds and limited evaluation
for the possibility of underlying ulcer. Hiatal hernia.

Multiple bilateral renal cysts measuring up to 7 cm. Distortion of
nonobstructing collecting system.

Atherosclerotic changes as detailed above.

Slightly heterogeneous lobulated prostate gland. Clinical and
laboratory correlation recommended. Recommended.

## 2016-11-05 IMAGING — DX DG CHEST 2V
2 series · 2 of 2 positions shown · non-contrast
Comparison: 06/23/2015.

CLINICAL DATA: 87-year-old male with cough and congestion for 4
days. Subsequent encounter.

EXAM:
CHEST  2 VIEW

[w chest pa]
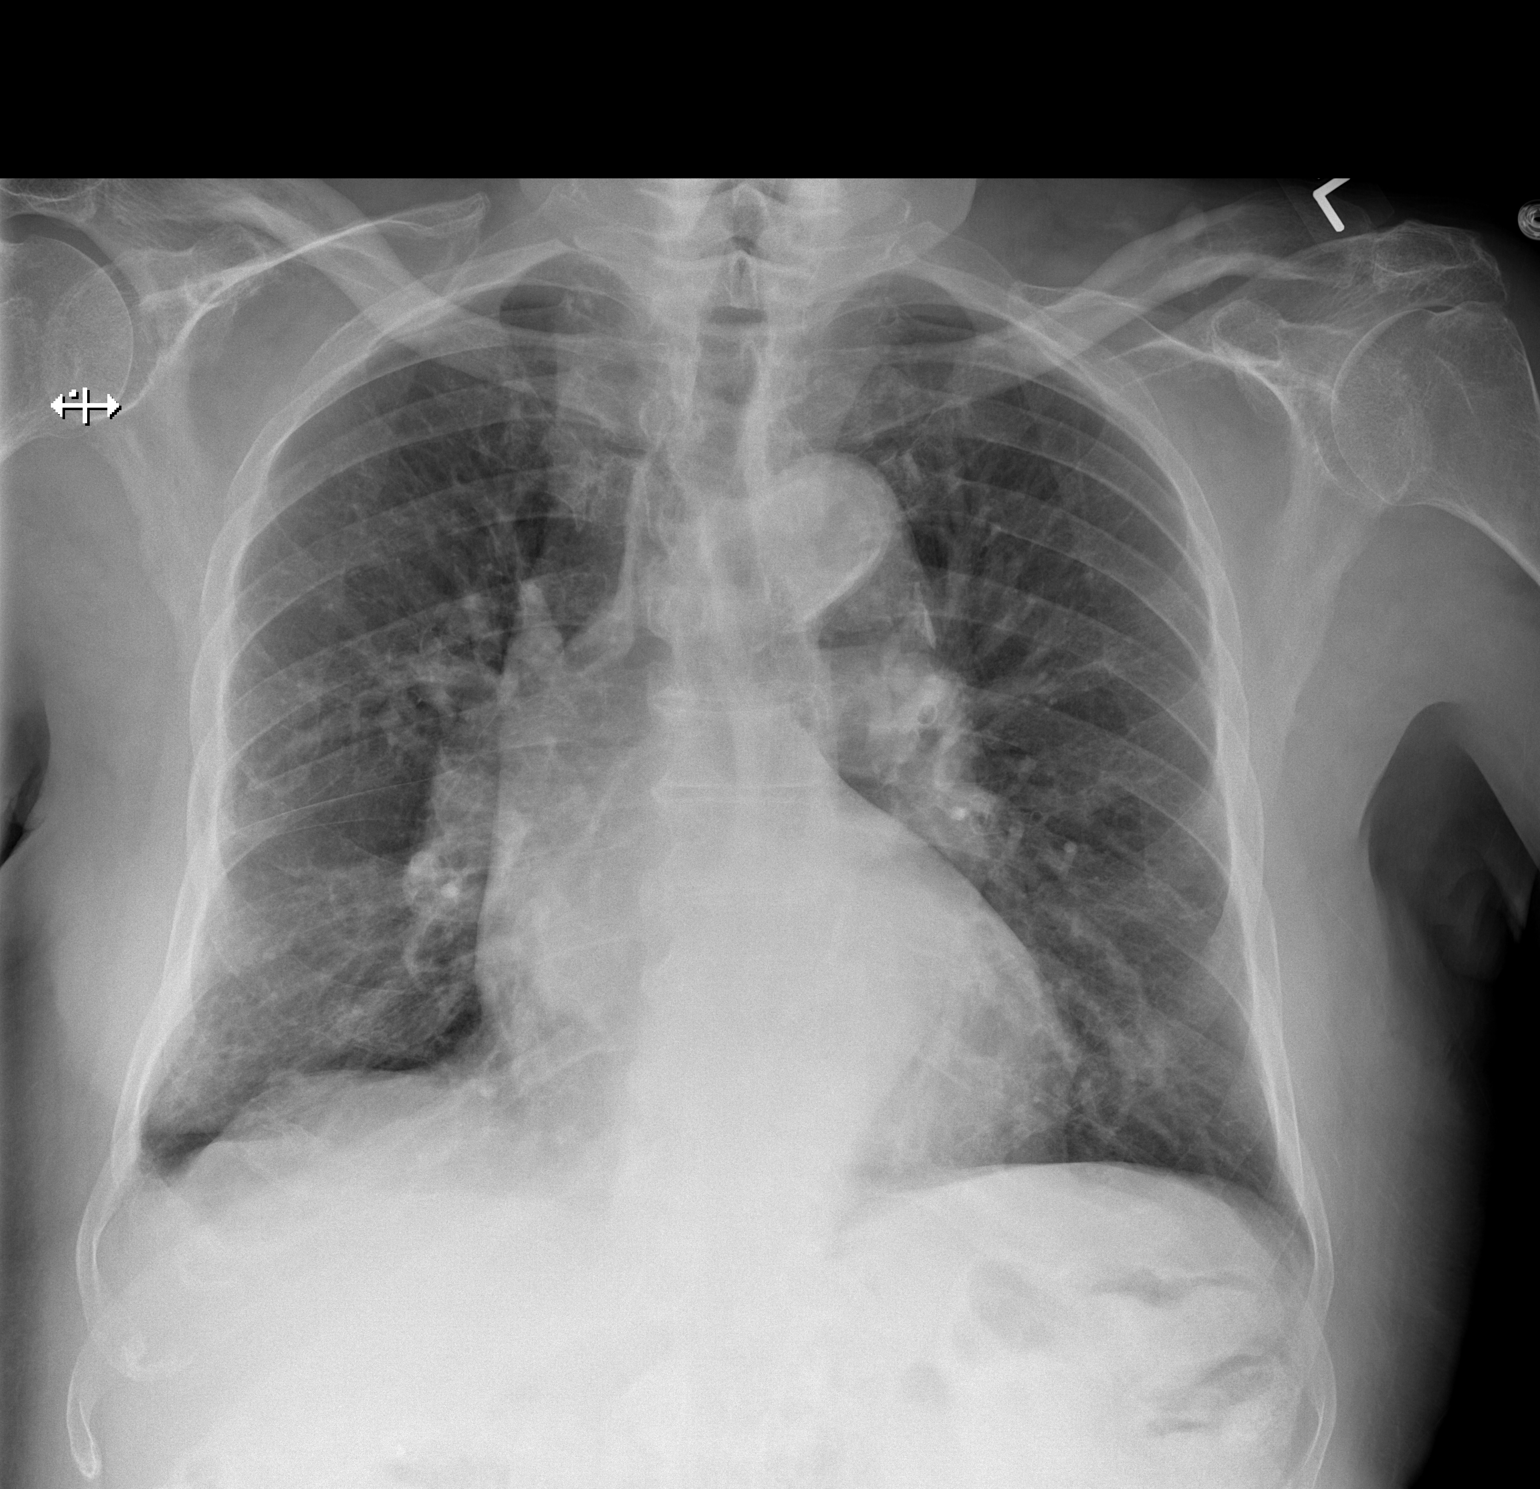

[w chest lat]
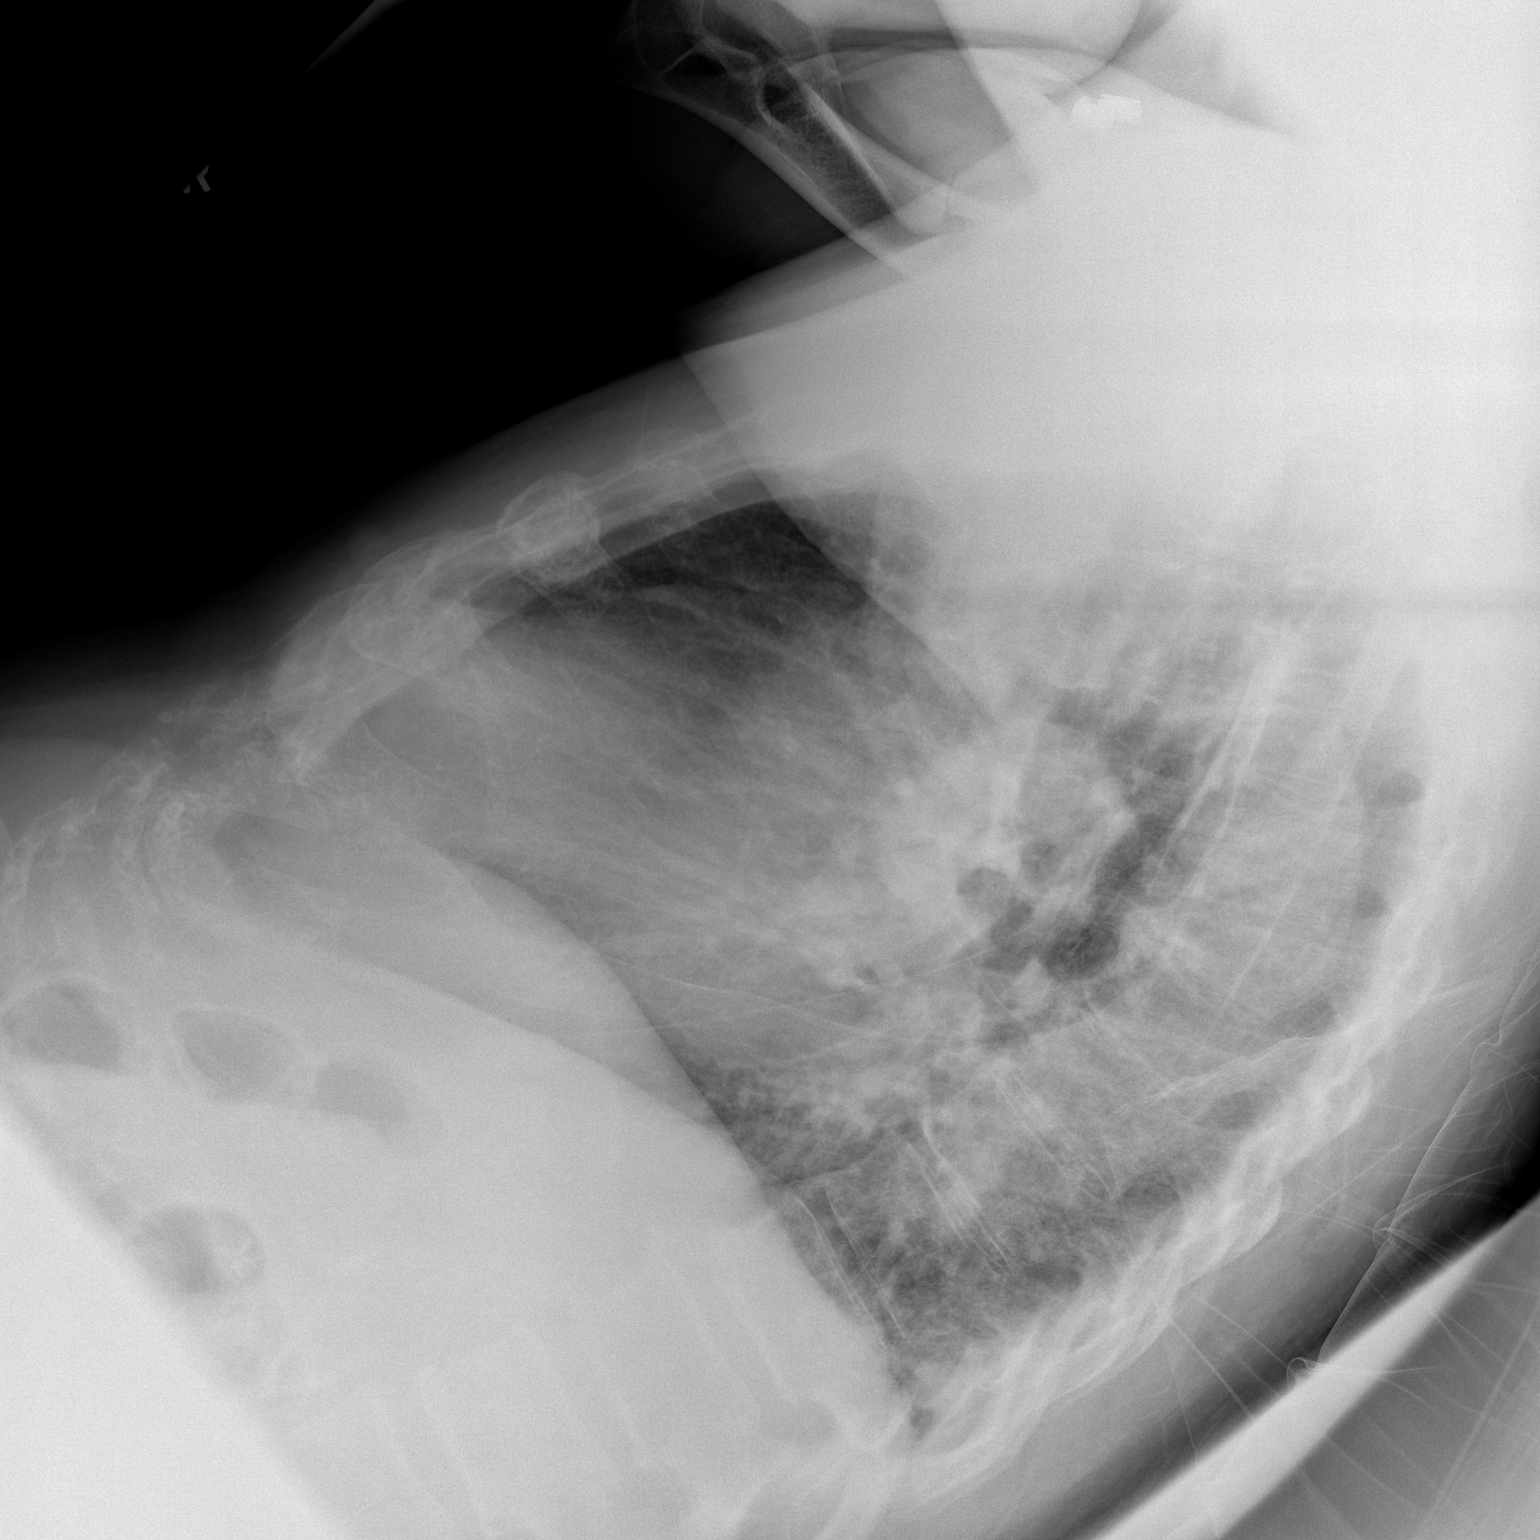

[2 of 2 positions shown; findings below may reference images not displayed]

FINDINGS: Dilated calcified tortuous aorta.

Heart size top-normal to minimally enlarged.

Central pulmonary vascular prominence and minimal peribronchial
thickening without frank pulmonary edema.

No segmental consolidation or pneumothorax.
IMPRESSION: Central pulmonary vascular prominence and minimal peribronchial
thickening without frank pulmonary edema.

Dilated calcified tortuous aorta.

## 2018-05-16 ENCOUNTER — Ambulatory Visit: Payer: Medicare Other | Admitting: Physical Therapy

## 2018-05-27 ENCOUNTER — Ambulatory Visit: Payer: Medicare Other | Attending: Family Medicine | Admitting: Physical Therapy

## 2018-05-27 ENCOUNTER — Other Ambulatory Visit: Payer: Self-pay

## 2018-05-27 DIAGNOSIS — R296 Repeated falls: Secondary | ICD-10-CM | POA: Diagnosis present

## 2018-05-27 DIAGNOSIS — R2681 Unsteadiness on feet: Secondary | ICD-10-CM | POA: Diagnosis not present

## 2018-05-27 NOTE — Therapy (Signed)
[place] [place], [name], [address] Phone: [phone]   Fax:  [phone]  Physical Therapy Evaluation  Patient Details  Name: Jose Kennedy MRN: [id number] Date of Birth: 1928-04-13 Referring Provider (PT): Reesa Chew   Encounter Date: 05/27/2018  PT End of Session - 05/27/18 0941    Visit Number  1    Date for PT Re-Evaluation  07/22/18    Authorization Type  KX    PT Start Time  0941    PT Stop Time  1024    PT Time Calculation (min)  43 min    Activity Tolerance  Patient tolerated treatment well    Behavior During Therapy  [facility] for tasks assessed/performed       Past Medical History:  Diagnosis Date  . Coronary artery disease   . Esophageal spasm   . Esophageal ulcer    "was hospitalized 2-3 times for bleeding; no problems in the last couple years" (07/19/2015)  . GERD (gastroesophageal reflux disease)    "severe" (07/19/2015)  . Heart murmur   . High cholesterol   . History of stomach ulcers    "right as you go into the stomach" (07/19/2015)  . Hypertension   . Laryngeal dystonia    chronic, appears as it he's dry heaving  . Stroke [facility]) 1991   "gait is a little different since" (07/19/2015)  . Type II diabetes mellitus (HCC)    insulin dependent    Past Surgical History:  Procedure Laterality Date  . APPENDECTOMY    . BACK SURGERY    . CATARACT EXTRACTION W/ INTRAOCULAR LENS IMPLANT Right   . ESOPHAGOGASTRODUODENOSCOPY  08/26/2011   Procedure: ESOPHAGOGASTRODUODENOSCOPY (EGD);  Surgeon: Wonda Horner, MD;  Location: [facility] ENDOSCOPY;  Service: Endoscopy;  Laterality: N/A;  . ESOPHAGOGASTRODUODENOSCOPY N/A 09/20/2013   Procedure: ESOPHAGOGASTRODUODENOSCOPY (EGD);  Surgeon: Jeryl Columbia, MD;  Location[facility] ENDOSCOPY;  Service: Endoscopy;  Laterality: N/A;  . LAPAROSCOPIC CHOLECYSTECTOMY    . LARYNX SURGERY     nodule  . LUMBAR DISC SURGERY      There were no vitals filed for this  visit.   Subjective Assessment - 05/27/18 0944    Subjective  Patient reports 2 falls in the past six months the last one being yesterday. The son reports he is unsure how the falls occured but normally pt is able to get around the house with his Subramaniam. Patient complains of soreness in both hips today.    Patient is accompained by:  Family member   son   Pertinent History  DM, lumbar disc surgery 10 yrs, HTN, stroke 1991    Patient Stated Goals  get stronger improve balance    Currently in Pain?  No/denies         [facility] PT Assessment - 05/27/18 0001      Assessment   Medical Diagnosis  unsteadiness on feet; at risk for falls    Referring Provider (PT)  Reesa Chew    Onset Date/Surgical Date  05/25/18    Prior Therapy  yes but they weren't consistent      Precautions   Precautions  Fall      Balance Screen   Has the patient fallen in the past 6 months  Yes    How many times?  2    Has the patient had a decrease in activity level because of a fear of falling?   Yes    Is the patient reluctant  to leave their home because of a fear of falling?   No      Home Environment   Living Environment  Private residence    Living Arrangements  Children    Type of [place] to enter    Entrance Stairs-Number of Steps  7    Entrance Stairs-Rails  Right    Additional Comments  has one level at his daughter's house      Prior Function   Level of Independence  Independent with household mobility with device    Vocation  Retired      Art therapist   Posture/Postural Control  Postural limitations    Postural Limitations  Rounded Shoulders;Forward head;Posterior pelvic tilt;Flexed trunk    Posture Comments  stands in bil knee flexion      ROM / Strength   AROM / PROM / Strength  Strength      Strength   Overall Strength Comments  Bil hip flex 4+/5; knee ext 4/5, DF 4+/5, LLE gluteal weakness (unable to stand on LLE without UE support)      Flexibility    Soft Tissue Assessment /Muscle Length  yes    Hamstrings  Bil tightness      Transfers   Transfers  Squat Pivot Transfers    Five time sit to stand comments   unable to complete > 1 at end of session    Squat Pivot Transfers  4: Min guard;With upper extremity assistance    Number of Reps  2 sets    Transfer Cueing  noes over toes, reach for armrest    Comments  spins and sits on R glut before making fully around      Ambulation/Gait   Ambulation/Gait  Yes    Ambulation/Gait Assistance  6: Modified independent (Device/Increase time)    Ambulation Distance (Feet)  20 Feet    Assistive device  Rolling Wurzer    Gait Pattern  Step-through pattern;Decreased stance time - left;Decreased step length - right;Decreased dorsiflexion - right;Decreased dorsiflexion - left;Right foot flat;Left foot flat;Right flexed knee in stance;Left flexed knee in stance    Ambulation Surface  Level      Balance   Balance Assessed  Yes      Standardized Balance Assessment   Standardized Balance Assessment  Berg Balance Test;Timed Up and Go Test      Berg Balance Test   Sit to Stand  Able to stand  independently using hands    Standing Unsupported  Able to stand 2 minutes with supervision    Sitting with Back Unsupported but Feet Supported on Floor or Stool  Able to sit safely and securely 2 minutes    Stand to Sit  Sits independently, has uncontrolled descent    Transfers  Needs one person to assist    Standing Unsupported with Eyes Closed  Able to stand 10 seconds safely    Standing Ubsupported with Feet Together  Needs help to attain position but able to stand for 30 seconds with feet together    From Standing, Reach Forward with Outstretched Arm  Can reach forward >5 cm safely (2")    From Standing Position, Pick up Object from Floor  Unable to try/needs assist to keep balance    From Standing Position, Turn to Look Behind Over each Shoulder  Turn sideways only but maintains balance    Turn 360 Degrees   Needs close supervision or verbal cueing  Standing Unsupported, Alternately Place Feet on Step/Stool  Able to complete >2 steps/needs minimal assist    Standing Unsupported, One Foot in Front  Needs help to step but can hold 15 seconds    Standing on One Leg  Unable to try or needs assist to prevent fall    Total Score  24      Timed Up and Go Test   Normal TUG (seconds)  85                Objective measurements completed on examination: See above findings.                PT Short Term Goals - 05/27/18 1236      PT SHORT TERM GOAL #1   Title  Pt will demonstrate compliance with HEP.    Time  2    Period  Weeks    Status  New        PT Long Term Goals - 05/27/18 1237      PT LONG TERM GOAL #1   Title  Pt will be able to to ambulate with step through pattern and least restrictive AD in order to show imporved LE strength and mobility.    Time  8    Period  Weeks    Status  New    Target Date  07/22/18      PT LONG TERM GOAL #2   Title  Pt will improve Berg balance score to >46 in order to decrease risk of fall.    Time  8    Period  Weeks    Status  New      PT LONG TERM GOAL #3   Title  Pt will improve LE strength to at least 4+/5 in order to improve mobility and balance.    Period  Weeks    Status  New      PT LONG TERM GOAL #4   Title  Pt will show improvements with 5x sit to stand and TUG in order to demonstrate imporved strength in the LEs.    Baseline  unable to perform 5x sit to stand at eval    Time  8    Period  Weeks    Status  New             Plan - 05/27/18 1230    Clinical Impression Statement  Patient presents today accompanied by his son with reports of 2 falls in the past 2 days. He presents in a w/c secondary to the long hallway to reach PT office. He amb with a RW at home and has only used this beginning 6 mos ago. He has significant LE weakness left > right affecting gait and balance. His BERG score is 24/56 and his  TUG with the RW is 85 seconds making him a high risk for falls. He also has difficulty with sit to stand and squat transfers.     History and Personal Factors relevant to plan of care:  DM, lumbar disc surgery 10 yrs, HTN, stroke 1991    Clinical Presentation  Evolving    Clinical Presentation due to:  increased falls    Clinical Decision Making  Low    Rehab Potential  Good    PT Frequency  3x / week    PT Duration  8 weeks    PT Treatment/Interventions  ADLs/Self Care Home Management;Gait training;Stair training;Therapeutic activities;Therapeutic exercise;Balance training;Neuromuscular re-education;Patient/family education;Manual techniques    PT Next  Visit Plan  LE strengthening, gait/balance    PT Home Exercise Plan  sit to stand    Consulted and Agree with Plan of Care  Patient       Patient will benefit from skilled therapeutic intervention in order to improve the following deficits and impairments:  Abnormal gait, Postural dysfunction, Decreased strength, Impaired flexibility, Decreased balance  Visit Diagnosis: Unsteadiness on feet - Plan: PT plan of care cert/re-cert  Repeated falls - Plan: PT plan of care cert/re-cert     Problem List Patient Active Problem List   Diagnosis Date Noted  . Bacteremia 02/19/2016  . Delirium 02/16/2016  . UTI (urinary tract infection) 02/16/2016  . Acute encephalopathy 07/19/2015  . Chronic diastolic CHF (congestive heart failure) ([place]) 07/19/2015  . Acute confusion   . Hyponatremia 07/13/2015  . Acute confusional state of metabolic origin 79/06/8331  . Diabetes mellitus with diabetic nephropathy ([place]) 07/13/2015  . Anemia of chronic disease 07/13/2015  . CKD (chronic kidney disease) stage 3, GFR 30-59 ml/min (HCC) 07/29/2013  . Coronary artery disease     Madelyn Flavors PT 05/27/2018, 12:41 PM  [address] [place], [name], [address] Phone: [phone]    Fax:  [phone]  Name: Jose Kennedy MRN: [id number] Date of Birth: 02-04-1928

## 2018-05-30 ENCOUNTER — Encounter: Payer: Self-pay | Admitting: Physical Therapy

## 2018-05-30 ENCOUNTER — Ambulatory Visit: Payer: Medicare Other | Attending: Family Medicine | Admitting: Physical Therapy

## 2018-05-30 DIAGNOSIS — R296 Repeated falls: Secondary | ICD-10-CM | POA: Diagnosis present

## 2018-05-30 DIAGNOSIS — R2681 Unsteadiness on feet: Secondary | ICD-10-CM | POA: Insufficient documentation

## 2018-05-30 NOTE — Therapy (Signed)
[name], [name], [address] Phone: [phone]   Fax:  [phone]  Physical Therapy Treatment  Patient Details  Name: Jose Kennedy MRN: [id number] Date of Birth: 16-Jun-1927 Referring Provider (PT): Reesa Chew   Encounter Date: 05/30/2018  PT End of Session - 05/30/18 1052    Visit Number  2    Date for PT Re-Evaluation  07/22/18    PT Start Time  1000    PT Stop Time  1044    PT Time Calculation (min)  44 min    Activity Tolerance  Patient tolerated treatment well    Behavior During Therapy  [facility] for tasks assessed/performed       Past Medical History:  Diagnosis Date  . Coronary artery disease   . Esophageal spasm   . Esophageal ulcer    "was hospitalized 2-3 times for bleeding; no problems in the last couple years" (07/19/2015)  . GERD (gastroesophageal reflux disease)    "severe" (07/19/2015)  . Heart murmur   . High cholesterol   . History of stomach ulcers    "right as you go into the stomach" (07/19/2015)  . Hypertension   . Laryngeal dystonia    chronic, appears as it he's dry heaving  . Stroke [facility]) 1991   "gait is a little different since" (07/19/2015)  . Type II diabetes mellitus (HCC)    insulin dependent    Past Surgical History:  Procedure Laterality Date  . APPENDECTOMY    . BACK SURGERY    . CATARACT EXTRACTION W/ INTRAOCULAR LENS IMPLANT Right   . ESOPHAGOGASTRODUODENOSCOPY  08/26/2011   Procedure: ESOPHAGOGASTRODUODENOSCOPY (EGD);  Surgeon: Wonda Horner, MD;  Location[facility] ENDOSCOPY;  Service: Endoscopy;  Laterality: N/A;  . ESOPHAGOGASTRODUODENOSCOPY N/A 09/20/2013   Procedure: ESOPHAGOGASTRODUODENOSCOPY (EGD);  Surgeon: Jeryl Columbia, MD;  Location: [facility] ENDOSCOPY;  Service: Endoscopy;  Laterality: N/A;  . LAPAROSCOPIC CHOLECYSTECTOMY    . LARYNX SURGERY     nodule  . LUMBAR DISC SURGERY      There were no vitals filed for this visit.  Subjective Assessment - 05/30/18  1006    Subjective  No falls since the last visit.      Currently in Pain?  No/denies                       [facility] Adult PT Treatment/Exercise - 05/30/18 0001      Transfers   Comments  all trnasfers with with Min A to and from chari and machines      Ambulation/Gait   Gait Comments  gait in clinic HHA cues for step lengtha nd to get knees straight 3x60, then 1 x 85 feet      Exercises   Exercises  Knee/Hip      Knee/Hip Exercises: Aerobic   Nustep  level 4 x 6 minutes      Knee/Hip Exercises: Machines for Strengthening   Cybex Knee Extension  5# 3x10 cues to go slow and get full ROM    Cybex Knee Flexion  25# 3x10 cues to go slow      Knee/Hip Exercises: Standing   Hip Flexion  Both;2 sets;10 reps    Hip Flexion Limitations  2#, cues to pick knees up high    Hip Abduction  Both;2 sets;10 reps    Abduction Limitations  2#      Knee/Hip Exercises: Seated   Other Seated Knee/Hip Exercises  red tband ankle DF, needs a lot of cues and some help to get started    Other Seated Knee/Hip Exercises  ball throwing working on trun and balance, weighted ball roll to him and then he would pick up and throw back, weighted ball trunk rotations.               PT Short Term Goals - 05/27/18 1236      PT SHORT TERM GOAL #1   Title  Pt will demonstrate compliance with HEP.    Time  2    Period  Weeks    Status  New        PT Long Term Goals - 05/27/18 1237      PT LONG TERM GOAL #1   Title  Pt will be able to to ambulate with step through pattern and least restrictive AD in order to show imporved LE strength and mobility.    Time  8    Period  Weeks    Status  New    Target Date  07/22/18      PT LONG TERM GOAL #2   Title  Pt will improve Berg balance score to >46 in order to decrease risk of fall.    Time  8    Period  Weeks    Status  New      PT LONG TERM GOAL #3   Title  Pt will improve LE strength to at least 4+/5 in order to improve mobility and  balance.    Period  Weeks    Status  New      PT LONG TERM GOAL #4   Title  Pt will show improvements with 5x sit to stand and TUG in order to demonstrate imporved strength in the LEs.    Baseline  unable to perform 5x sit to stand at eval    Time  8    Period  Weeks    Status  New            Plan - 05/30/18 1053    Clinical Impression Statement  Patient needs min A for transfers, he tends to really shuffle his feet, needs alot of cues to pick up feet and take bigger steps.  With the exercises needs a lot of cues to do correctly, slower and to get better control and end ROM    PT Next Visit Plan  LE strengthening, gait/balance    Consulted and Agree with Plan of Care  Patient       Patient will benefit from skilled therapeutic intervention in order to improve the following deficits and impairments:  Abnormal gait, Postural dysfunction, Decreased strength, Impaired flexibility, Decreased balance  Visit Diagnosis: Unsteadiness on feet  Repeated falls     Problem List Patient Active Problem List   Diagnosis Date Noted  . Bacteremia 02/19/2016  . Delirium 02/16/2016  . UTI (urinary tract infection) 02/16/2016  . Acute encephalopathy 07/19/2015  . Chronic diastolic CHF (congestive heart failure) ([place]) 07/19/2015  . Acute confusion   . Hyponatremia 07/13/2015  . Acute confusional state of metabolic origin 42/68/3419  . Diabetes mellitus with diabetic nephropathy ([place]) 07/13/2015  . Anemia of chronic disease 07/13/2015  . CKD (chronic kidney disease) stage 3, GFR 30-59 ml/min (HCC) 07/29/2013  . Coronary artery disease     Sumner Boast., PT 05/30/2018, 10:54 AM  [place], [name], [address] Phone: [phone]  Fax:  [phone]  Name: Jose Kennedy MRN: [id number] Date of Birth: 1927/11/21

## 2018-06-08 ENCOUNTER — Ambulatory Visit: Payer: Medicare Other | Admitting: Physical Therapy

## 2018-06-08 ENCOUNTER — Encounter: Payer: Self-pay | Admitting: Physical Therapy

## 2018-06-08 DIAGNOSIS — R2681 Unsteadiness on feet: Secondary | ICD-10-CM

## 2018-06-08 DIAGNOSIS — R296 Repeated falls: Secondary | ICD-10-CM

## 2018-06-08 NOTE — Therapy (Signed)
[place], [name], [address] Phone: [phone]   Fax:  [phone]  Physical Therapy Treatment  Patient Details  Name: Jose Kennedy MRN: [id number] Date of Birth: 27-Feb-1928 Referring Provider (PT): Reesa Chew   Encounter Date: 06/08/2018  PT End of Session - 06/08/18 1102    Visit Number  3    Date for PT Re-Evaluation  07/22/18    Authorization Type  KX    PT Start Time  1005    PT Stop Time  1055    PT Time Calculation (min)  50 min    Activity Tolerance  Patient tolerated treatment well    Behavior During Therapy  [facility] for tasks assessed/performed       Past Medical History:  Diagnosis Date  . Coronary artery disease   . Esophageal spasm   . Esophageal ulcer    "was hospitalized 2-3 times for bleeding; no problems in the last couple years" (07/19/2015)  . GERD (gastroesophageal reflux disease)    "severe" (07/19/2015)  . Heart murmur   . High cholesterol   . History of stomach ulcers    "right as you go into the stomach" (07/19/2015)  . Hypertension   . Laryngeal dystonia    chronic, appears as it he's dry heaving  . Stroke [facility]) 1991   "gait is a little different since" (07/19/2015)  . Type II diabetes mellitus (HCC)    insulin dependent    Past Surgical History:  Procedure Laterality Date  . APPENDECTOMY    . BACK SURGERY    . CATARACT EXTRACTION W/ INTRAOCULAR LENS IMPLANT Right   . ESOPHAGOGASTRODUODENOSCOPY  08/26/2011   Procedure: ESOPHAGOGASTRODUODENOSCOPY (EGD);  Surgeon: Wonda Horner, MD;  Location: [facility] ENDOSCOPY;  Service: Endoscopy;  Laterality: N/A;  . ESOPHAGOGASTRODUODENOSCOPY N/A 09/20/2013   Procedure: ESOPHAGOGASTRODUODENOSCOPY (EGD);  Surgeon: Jeryl Columbia, MD;  Location: [facility] ENDOSCOPY;  Service: Endoscopy;  Laterality: N/A;  . LAPAROSCOPIC CHOLECYSTECTOMY    . LARYNX SURGERY     nodule  . LUMBAR DISC SURGERY      There were no vitals filed for this  visit.  Subjective Assessment - 06/08/18 1003    Subjective  No falls "Fair"    Currently in Pain?  No/denies                       [facility] Adult PT Treatment/Exercise - 06/08/18 0001      Transfers   Comments  all trnasfers with with Min A to and from chair and machines      Ambulation/Gait   Gait Comments  gait in clinic HHA cues for step lengtha nd to get knees straight 3x60, then 1 x 185 feet outside to truck      Knee/Hip Exercises: Aerobic   Nustep  level 4 x 6 minutes      Knee/Hip Exercises: Machines for Strengthening   Cybex Knee Extension  5# 2x10 cues to go slow and get full ROM    Cybex Knee Flexion  25# 3x10 cues to go slow      Knee/Hip Exercises: Standing   Hip Flexion  Both;2 sets;10 reps    Hip Flexion Limitations  2#, cues to pick knees up high    Hip Abduction  Both;2 sets;10 reps    Abduction Limitations  2#      Knee/Hip Exercises: Seated   Other Seated Knee/Hip Exercises  red tband ankle DF 2x15 each  Sit to Sand  5 reps;with UE support;2 sets   min assit               PT Short Term Goals - 05/27/18 1236      PT SHORT TERM GOAL #1   Title  Pt will demonstrate compliance with HEP.    Time  2    Period  Weeks    Status  New        PT Long Term Goals - 05/27/18 1237      PT LONG TERM GOAL #1   Title  Pt will be able to to ambulate with step through pattern and least restrictive AD in order to show imporved LE strength and mobility.    Time  8    Period  Weeks    Status  New    Target Date  07/22/18      PT LONG TERM GOAL #2   Title  Pt will improve Berg balance score to >46 in order to decrease risk of fall.    Time  8    Period  Weeks    Status  New      PT LONG TERM GOAL #3   Title  Pt will improve LE strength to at least 4+/5 in order to improve mobility and balance.    Period  Weeks    Status  New      PT LONG TERM GOAL #4   Title  Pt will show improvements with 5x sit to stand and TUG in order to  demonstrate imporved strength in the LEs.    Baseline  unable to perform 5x sit to stand at eval    Time  [place] - 06/08/18 1103    Clinical Impression Statement  Patient continues to need min assist for all transfers. Cues to take bigger step with gait, hr tends to shuffle his RLE. HHA X2 needed with hip abd. Standing hip flexion done with RW, tactile cues for knee height. Cues to slow down with leg curls and extensions.     Rehab Potential  Good    PT Treatment/Interventions  ADLs/Self Care Home Management;Gait training;Stair training;Therapeutic activities;Therapeutic exercise;Balance training;Neuromuscular re-education;Patient/family education;Manual techniques    PT Next Visit Plan  LE strengthening, gait/balance       Patient will benefit from skilled therapeutic intervention in order to improve the following deficits and impairments:  Abnormal gait, Postural dysfunction, Decreased strength, Impaired flexibility, Decreased balance  Visit Diagnosis: Unsteadiness on feet  Repeated falls     Problem List Patient Active Problem List   Diagnosis Date Noted  . Bacteremia 02/19/2016  . Delirium 02/16/2016  . UTI (urinary tract infection) 02/16/2016  . Acute encephalopathy 07/19/2015  . Chronic diastolic CHF (congestive heart failure) ([place]) 07/19/2015  . Acute confusion   . Hyponatremia 07/13/2015  . Acute confusional state of metabolic origin 29/79/8921  . Diabetes mellitus with diabetic nephropathy ([place]) 07/13/2015  . Anemia of chronic disease 07/13/2015  . CKD (chronic kidney disease) stage 3, GFR 30-59 ml/min (HCC) 07/29/2013  . Coronary artery disease     Scot Jun, PTA 06/08/2018, 11:10 AM  [place], [name], [address] Phone: [phone]   Fax:  [phone]  Name: Jose Kennedy MRN: [id number] Date of Birth: 04-29-1927

## 2018-06-10 ENCOUNTER — Ambulatory Visit: Payer: Medicare Other | Admitting: Physical Therapy

## 2018-06-14 ENCOUNTER — Ambulatory Visit: Payer: Medicare Other | Admitting: Physical Therapy

## 2018-06-14 ENCOUNTER — Encounter: Payer: Self-pay | Admitting: Physical Therapy

## 2018-06-14 DIAGNOSIS — R2681 Unsteadiness on feet: Secondary | ICD-10-CM | POA: Diagnosis not present

## 2018-06-14 DIAGNOSIS — R296 Repeated falls: Secondary | ICD-10-CM

## 2018-06-14 NOTE — Therapy (Signed)
[place], [name], [address] Phone: [phone]   Fax:  [phone]  Physical Therapy Treatment  Patient Details  Name: Jose Kennedy MRN: [id number] Date of Birth: 05-25-1927 Referring Provider (PT): Reesa Chew   Encounter Date: 06/14/2018  PT End of Session - 06/14/18 1104    Visit Number  4    Date for PT Re-Evaluation  07/22/18    Authorization Type  KX    PT Start Time  1015    PT Stop Time  1100    PT Time Calculation (min)  45 min    Activity Tolerance  Patient tolerated treatment well    Behavior During Therapy  [facility] for tasks assessed/performed       Past Medical History:  Diagnosis Date  . Coronary artery disease   . Esophageal spasm   . Esophageal ulcer    "was hospitalized 2-3 times for bleeding; no problems in the last couple years" (07/19/2015)  . GERD (gastroesophageal reflux disease)    "severe" (07/19/2015)  . Heart murmur   . High cholesterol   . History of stomach ulcers    "right as you go into the stomach" (07/19/2015)  . Hypertension   . Laryngeal dystonia    chronic, appears as it he's dry heaving  . Stroke [facility]) 1991   "gait is a little different since" (07/19/2015)  . Type II diabetes mellitus (HCC)    insulin dependent    Past Surgical History:  Procedure Laterality Date  . APPENDECTOMY    . BACK SURGERY    . CATARACT EXTRACTION W/ INTRAOCULAR LENS IMPLANT Right   . ESOPHAGOGASTRODUODENOSCOPY  08/26/2011   Procedure: ESOPHAGOGASTRODUODENOSCOPY (EGD);  Surgeon: Wonda Horner, MD;  Location: [facility] ENDOSCOPY;  Service: Endoscopy;  Laterality: N/A;  . ESOPHAGOGASTRODUODENOSCOPY N/A 09/20/2013   Procedure: ESOPHAGOGASTRODUODENOSCOPY (EGD);  Surgeon: Jeryl Columbia, MD;  Location: [facility] ENDOSCOPY;  Service: Endoscopy;  Laterality: N/A;  . LAPAROSCOPIC CHOLECYSTECTOMY    . LARYNX SURGERY     nodule  . LUMBAR DISC SURGERY      There were no vitals filed for this  visit.  Subjective Assessment - 06/14/18 1019    Subjective  "Tolerable"    Currently in Pain?  No/denies                       [facility] Adult PT Treatment/Exercise - 06/14/18 0001      Ambulation/Gait   Gait Comments  gait in clinic HHA cues for step length and to get knees straight 2x60, then 1 x 185 feet outside to truck      Knee/Hip Exercises: Aerobic   Nustep  level 4 x 6 minutes      Knee/Hip Exercises: Machines for Strengthening   Cybex Knee Extension  5# 3x10 cues to go slow and get full ROM    Cybex Knee Flexion  25# 3x10 cues to go slow    Cybex Leg Press  20lb 3x10      Knee/Hip Exercises: Seated   Other Seated Knee/Hip Exercises  green tband ankle DF 2x15 each               PT Short Term Goals - 05/27/18 1236      PT SHORT TERM GOAL #1   Title  Pt will demonstrate compliance with HEP.    Time  2    Period  Weeks    Status  New  PT Long Term Goals - 06/14/18 1104      PT LONG TERM GOAL #1   Title  Pt will be able to to ambulate with step through pattern and least restrictive AD in order to show imporved LE strength and mobility.    Status  On-going      PT LONG TERM GOAL #2   Title  Pt will improve Berg balance score to >46 in order to decrease risk of fall.    Status  On-going      PT LONG TERM GOAL #3   Title  Pt will improve LE strength to at least 4+/5 in order to improve mobility and balance.    Status  On-going            Plan - 06/14/18 1105    Clinical Impression Statement  Pt progressed to more LE strengthening. Min assist for all transfers. Pt with som unsteadiness on feet, constant cues needed to lean forward. Pt with a posterior lean while on his feet. Cues to slow down with seated leg curls and extensions. Cues to increase step length with gait.    Rehab Potential  Good    PT Frequency  3x / week    PT Treatment/Interventions  ADLs/Self Care Home Management;Gait training;Stair training;Therapeutic  activities;Therapeutic exercise;Balance training;Neuromuscular re-education;Patient/family education;Manual techniques    PT Next Visit Plan  LE strengthening, gait/balance       Patient will benefit from skilled therapeutic intervention in order to improve the following deficits and impairments:  Abnormal gait, Postural dysfunction, Decreased strength, Impaired flexibility, Decreased balance  Visit Diagnosis: Repeated falls  Unsteadiness on feet     Problem List Patient Active Problem List   Diagnosis Date Noted  . Bacteremia 02/19/2016  . Delirium 02/16/2016  . UTI (urinary tract infection) 02/16/2016  . Acute encephalopathy 07/19/2015  . Chronic diastolic CHF (congestive heart failure) ([place]) 07/19/2015  . Acute confusion   . Hyponatremia 07/13/2015  . Acute confusional state of metabolic origin 31/49/7026  . Diabetes mellitus with diabetic nephropathy ([place]) 07/13/2015  . Anemia of chronic disease 07/13/2015  . CKD (chronic kidney disease) stage 3, GFR 30-59 ml/min (HCC) 07/29/2013  . Coronary artery disease     Scot Jun, PTA 06/14/2018, 11:10 AM  [place], [name], [address] Phone: [phone]   Fax:  [phone]  Name: Jose Kennedy MRN: [id number] Date of Birth: February 01, 1928

## 2018-06-17 ENCOUNTER — Ambulatory Visit: Payer: Medicare Other | Admitting: Physical Therapy

## 2018-06-20 ENCOUNTER — Ambulatory Visit: Payer: Medicare Other | Admitting: Physical Therapy

## 2018-06-20 DIAGNOSIS — R2681 Unsteadiness on feet: Secondary | ICD-10-CM | POA: Diagnosis not present

## 2018-06-20 DIAGNOSIS — R296 Repeated falls: Secondary | ICD-10-CM

## 2018-06-20 NOTE — Therapy (Signed)
[place], [name], [address] Phone: [phone]   Fax:  [phone]  Physical Therapy Treatment  Patient Details  Name: Jose Kennedy MRN: [id number] Date of Birth: 05-Jul-1927 Referring Provider (PT): Reesa Chew   Encounter Date: 06/20/2018  PT End of Session - 06/20/18 1103    Visit Number  5    Date for PT Re-Evaluation  07/22/18    Authorization Type  KX    PT Start Time  1103    PT Stop Time  1147    PT Time Calculation (min)  44 min    Activity Tolerance  Patient tolerated treatment well    Behavior During Therapy  [facility] for tasks assessed/performed       Past Medical History:  Diagnosis Date  . Coronary artery disease   . Esophageal spasm   . Esophageal ulcer    "was hospitalized 2-3 times for bleeding; no problems in the last couple years" (07/19/2015)  . GERD (gastroesophageal reflux disease)    "severe" (07/19/2015)  . Heart murmur   . High cholesterol   . History of stomach ulcers    "right as you go into the stomach" (07/19/2015)  . Hypertension   . Laryngeal dystonia    chronic, appears as it he's dry heaving  . Stroke [facility]) 1991   "gait is a little different since" (07/19/2015)  . Type II diabetes mellitus (HCC)    insulin dependent    Past Surgical History:  Procedure Laterality Date  . APPENDECTOMY    . BACK SURGERY    . CATARACT EXTRACTION W/ INTRAOCULAR LENS IMPLANT Right   . ESOPHAGOGASTRODUODENOSCOPY  08/26/2011   Procedure: ESOPHAGOGASTRODUODENOSCOPY (EGD);  Surgeon: Wonda Horner, MD;  Location: [facility] ENDOSCOPY;  Service: Endoscopy;  Laterality: N/A;  . ESOPHAGOGASTRODUODENOSCOPY N/A 09/20/2013   Procedure: ESOPHAGOGASTRODUODENOSCOPY (EGD);  Surgeon: Jeryl Columbia, MD;  Location: [facility] ENDOSCOPY;  Service: Endoscopy;  Laterality: N/A;  . LAPAROSCOPIC CHOLECYSTECTOMY    . LARYNX SURGERY     nodule  . LUMBAR DISC SURGERY      There were no vitals filed for this  visit.  Subjective Assessment - 06/20/18 1111    Subjective  I'm not feeling very energetic today.    Pertinent History  DM, lumbar disc surgery 10 yrs, HTN, stroke 1991    Patient Stated Goals  get stronger improve balance                       [facility] Adult PT Treatment/Exercise - 06/20/18 0001      Knee/Hip Exercises: Aerobic   Nustep  level 4 x 7 minutes      Knee/Hip Exercises: Machines for Strengthening   Cybex Knee Extension  5# 3x10 cues to go slow and get full ROM    Cybex Knee Flexion  25# 3x10 cues to go slow    Cybex Leg Press  30# 3x10      Knee/Hip Exercises: Standing   Hip Flexion  Stengthening;Both;2 sets;10 reps;Knee bent    Hip Flexion Limitations  2#, cues to pick knees up high    Hip Abduction  Both;2 sets;10 reps    Abduction Limitations  compensated with hip flexion so removed wt      Knee/Hip Exercises: Seated   Sit to Sand  5 reps;with UE support   and min A to get full trunk ext; poor ctrl of descent  PT Short Term Goals - 05/27/18 1236      PT SHORT TERM GOAL #1   Title  Pt will demonstrate compliance with HEP.    Time  2    Period  Weeks    Status  New        PT Long Term Goals - 06/14/18 1104      PT LONG TERM GOAL #1   Title  Pt will be able to to ambulate with step through pattern and least restrictive AD in order to show imporved LE strength and mobility.    Status  On-going      PT LONG TERM GOAL #2   Title  Pt will improve Berg balance score to >46 in order to decrease risk of fall.    Status  On-going      PT LONG TERM GOAL #3   Title  Pt will improve LE strength to at least 4+/5 in order to improve mobility and balance.    Status  On-going            Plan - 06/20/18 1218    Clinical Impression Statement  Pt did well with TE despite stating he did not have energy. He requires contstant cueing with gait and transfers but is min A with all. Continued post lean with standing and walking.     PT Treatment/Interventions  ADLs/Self Care Home Management;Gait training;Stair training;Therapeutic activities;Therapeutic exercise;Balance training;Neuromuscular re-education;Patient/family education;Manual techniques    PT Next Visit Plan  LE strengthening, gait/balance       Patient will benefit from skilled therapeutic intervention in order to improve the following deficits and impairments:  Abnormal gait, Postural dysfunction, Decreased strength, Impaired flexibility, Decreased balance  Visit Diagnosis: Repeated falls  Unsteadiness on feet     Problem List Patient Active Problem List   Diagnosis Date Noted  . Bacteremia 02/19/2016  . Delirium 02/16/2016  . UTI (urinary tract infection) 02/16/2016  . Acute encephalopathy 07/19/2015  . Chronic diastolic CHF (congestive heart failure) ([place]) 07/19/2015  . Acute confusion   . Hyponatremia 07/13/2015  . Acute confusional state of metabolic origin 57/90/3833  . Diabetes mellitus with diabetic nephropathy ([place]) 07/13/2015  . Anemia of chronic disease 07/13/2015  . CKD (chronic kidney disease) stage 3, GFR 30-59 ml/min (HCC) 07/29/2013  . Coronary artery disease     Madelyn Flavors PT 06/20/2018, 12:27 PM  [place] [address] [place], [name], [address] Phone: [phone]   Fax:  [phone]  Name: Jahmire Ruffins MRN: [id number] Date of Birth: 08/04/1927

## 2018-06-22 ENCOUNTER — Encounter: Payer: Self-pay | Admitting: Physical Therapy

## 2018-06-22 ENCOUNTER — Ambulatory Visit: Payer: Medicare Other | Admitting: Physical Therapy

## 2018-06-22 DIAGNOSIS — R296 Repeated falls: Secondary | ICD-10-CM

## 2018-06-22 DIAGNOSIS — R2681 Unsteadiness on feet: Secondary | ICD-10-CM | POA: Diagnosis not present

## 2018-06-22 NOTE — Therapy (Signed)
[place], [name], [address] Phone: [phone]   Fax:  [phone]  Physical Therapy Treatment  Patient Details  Name: Jose Kennedy MRN: [id number] Date of Birth: January 03, 1928 Referring Provider (PT): Reesa Chew   Encounter Date: 06/22/2018  PT End of Session - 06/22/18 1056    Visit Number  6    Date for PT Re-Evaluation  07/22/18    Authorization Type  KX    PT Start Time  1010    PT Stop Time  1057    PT Time Calculation (min)  47 min    Activity Tolerance  Patient tolerated treatment well    Behavior During Therapy  [facility] for tasks assessed/performed       Past Medical History:  Diagnosis Date  . Coronary artery disease   . Esophageal spasm   . Esophageal ulcer    "was hospitalized 2-3 times for bleeding; no problems in the last couple years" (07/19/2015)  . GERD (gastroesophageal reflux disease)    "severe" (07/19/2015)  . Heart murmur   . High cholesterol   . History of stomach ulcers    "right as you go into the stomach" (07/19/2015)  . Hypertension   . Laryngeal dystonia    chronic, appears as it he's dry heaving  . Stroke [facility]) 1991   "gait is a little different since" (07/19/2015)  . Type II diabetes mellitus (HCC)    insulin dependent    Past Surgical History:  Procedure Laterality Date  . APPENDECTOMY    . BACK SURGERY    . CATARACT EXTRACTION W/ INTRAOCULAR LENS IMPLANT Right   . ESOPHAGOGASTRODUODENOSCOPY  08/26/2011   Procedure: ESOPHAGOGASTRODUODENOSCOPY (EGD);  Surgeon: Wonda Horner, MD;  Location: [facility] ENDOSCOPY;  Service: Endoscopy;  Laterality: N/A;  . ESOPHAGOGASTRODUODENOSCOPY N/A 09/20/2013   Procedure: ESOPHAGOGASTRODUODENOSCOPY (EGD);  Surgeon: Jeryl Columbia, MD;  Location: [facility] ENDOSCOPY;  Service: Endoscopy;  Laterality: N/A;  . LAPAROSCOPIC CHOLECYSTECTOMY    . LARYNX SURGERY     nodule  . LUMBAR DISC SURGERY      There were no vitals filed for this  visit.  Subjective Assessment - 06/22/18 1013    Subjective  "Fair"    Pertinent History  DM, lumbar disc surgery 10 yrs, HTN, stroke 1991    Patient Stated Goals  get stronger improve balance    Currently in Pain?  No/denies                       [facility] Adult PT Treatment/Exercise - 06/22/18 0001      Ambulation/Gait   Gait Comments  gait in clinic HHA cues for step length and to get knees straight 2x60, then 1 x 185 feet outside to truck      Knee/Hip Exercises: Aerobic   Nustep  level 4 x 7 minutes      Knee/Hip Exercises: Machines for Strengthening   Cybex Leg Press  40 3x10       Knee/Hip Exercises: Standing   Other Standing Knee Exercises  Standing march in RW 2x10      Knee/Hip Exercises: Seated   Long Arc Quad  10 reps;2 sets;Both    Long Arc Quad Weight  3 lbs.    Other Seated Knee/Hip Exercises  Seated rows green tband 2x10    Marching  Both;2 sets;10 reps    Hamstring Curl  2 sets;Both;10 reps    Hamstring Limitations  green tband  PT Short Term Goals - 05/27/18 1236      PT SHORT TERM GOAL #1   Title  Pt will demonstrate compliance with HEP.    Time  2    Period  Weeks    Status  New        PT Long Term Goals - 06/22/18 1020      PT LONG TERM GOAL #1   Title  Pt will be able to to ambulate with step through pattern and least restrictive AD in order to show imporved LE strength and mobility.    Status  On-going            Plan - 06/22/18 1057    Clinical Impression Statement  Pt continues to require min assist to stand. Pt has a posterior lean when standing needing cues to lean forward. Cues to slow down with seated HS curls. Cues to take bigger steps with RLE when walking. All ambulation requires HHA x2. Pt reports difficulty with standing marches in RW.    PT Frequency  3x / week    PT Duration  8 weeks    PT Treatment/Interventions  ADLs/Self Care Home Management;Gait training;Stair training;Therapeutic  activities;Therapeutic exercise;Balance training;Neuromuscular re-education;Patient/family education;Manual techniques    PT Home Exercise Plan  Seated Rows, marching, HS curls, LAQ       Patient will benefit from skilled therapeutic intervention in order to improve the following deficits and impairments:  Abnormal gait, Postural dysfunction, Decreased strength, Impaired flexibility, Decreased balance  Visit Diagnosis: Repeated falls  Unsteadiness on feet     Problem List Patient Active Problem List   Diagnosis Date Noted  . Bacteremia 02/19/2016  . Delirium 02/16/2016  . UTI (urinary tract infection) 02/16/2016  . Acute encephalopathy 07/19/2015  . Chronic diastolic CHF (congestive heart failure) ([place]) 07/19/2015  . Acute confusion   . Hyponatremia 07/13/2015  . Acute confusional state of metabolic origin 75/44/9201  . Diabetes mellitus with diabetic nephropathy ([place]) 07/13/2015  . Anemia of chronic disease 07/13/2015  . CKD (chronic kidney disease) stage 3, GFR 30-59 ml/min (HCC) 07/29/2013  . Coronary artery disease     Scot Jun, PTA 06/22/2018, 10:59 AM  [address] [place], [name], [address] Phone: [phone]   Fax:  [phone]  Name: Jose Kennedy MRN: [id number] Date of Birth: 1927-12-07

## 2019-05-18 ENCOUNTER — Ambulatory Visit: Payer: Medicare Other | Attending: Internal Medicine

## 2019-05-18 DIAGNOSIS — Z23 Encounter for immunization: Secondary | ICD-10-CM

## 2019-05-18 NOTE — Progress Notes (Signed)
   Covid-19 Vaccination Clinic  Name:  Jose Kennedy    MRN: [id number] DOB: [birth year]  05/18/2019  Mr. Jose Kennedy was observed post Covid-19 immunization for 15 minutes without incidence. He was provided with Vaccine Information Sheet and instruction to access the V-Safe system.   Mr. Jose Kennedy was instructed to call 911 with any severe reactions post vaccine: Marland Kitchen Difficulty breathing  . Swelling of your face and throat  . A fast heartbeat  . A bad rash all over your body  . Dizziness and weakness    Immunizations Administered    Name Date Dose VIS Date Route   Pfizer COVID-19 Vaccine 05/18/2019  5:10 PM 0.3 mL 04/07/2019 Intramuscular   Manufacturer: [place]   Lot: [id number]   [place]: [id number]

## 2019-06-07 ENCOUNTER — Ambulatory Visit: Payer: Medicare Other | Attending: Internal Medicine

## 2019-06-07 DIAGNOSIS — Z23 Encounter for immunization: Secondary | ICD-10-CM | POA: Insufficient documentation

## 2019-06-07 NOTE — Progress Notes (Signed)
   Covid-19 Vaccination Clinic  Name:  Anatoli Nola    MRN: [id number] DOB: March 31, 1928  06/07/2019  Mr. Kaba was observed post Covid-19 immunization for 15 minutes without incidence. He was provided with Vaccine Information Sheet and instruction to access the V-Safe system.   Mr. Berdan was instructed to call 911 with any severe reactions post vaccine: Marland Kitchen Difficulty breathing  . Swelling of your face and throat  . A fast heartbeat  . A bad rash all over your body  . Dizziness and weakness    Immunizations Administered    Name Date Dose VIS Date Route   Pfizer COVID-19 Vaccine 06/07/2019 11:42 AM 0.3 mL 04/07/2019 Intramuscular   Manufacturer: [place]   Lot: [id number]   [place]: [id number]

## 2019-09-24 ENCOUNTER — Other Ambulatory Visit: Payer: Self-pay

## 2019-09-24 ENCOUNTER — Emergency Department (HOSPITAL_BASED_OUTPATIENT_CLINIC_OR_DEPARTMENT_OTHER)
Admission: EM | Admit: 2019-09-24 | Discharge: 2019-09-24 | Disposition: A | Discharge status: Home / Self Care | Payer: Medicare Other | Attending: Emergency Medicine | Admitting: Emergency Medicine

## 2019-09-24 ENCOUNTER — Emergency Department (HOSPITAL_BASED_OUTPATIENT_CLINIC_OR_DEPARTMENT_OTHER): Payer: Medicare Other

## 2019-09-24 DIAGNOSIS — N183 Chronic kidney disease, stage 3 unspecified: Secondary | ICD-10-CM | POA: Diagnosis not present

## 2019-09-24 DIAGNOSIS — I2510 Atherosclerotic heart disease of native coronary artery without angina pectoris: Secondary | ICD-10-CM | POA: Diagnosis not present

## 2019-09-24 DIAGNOSIS — Y92019 Unspecified place in single-family (private) house as the place of occurrence of the external cause: Secondary | ICD-10-CM | POA: Diagnosis not present

## 2019-09-24 DIAGNOSIS — Y999 Unspecified external cause status: Secondary | ICD-10-CM | POA: Diagnosis not present

## 2019-09-24 DIAGNOSIS — Z794 Long term (current) use of insulin: Secondary | ICD-10-CM | POA: Diagnosis not present

## 2019-09-24 DIAGNOSIS — W010XXA Fall on same level from slipping, tripping and stumbling without subsequent striking against object, initial encounter: Secondary | ICD-10-CM | POA: Diagnosis not present

## 2019-09-24 DIAGNOSIS — I5032 Chronic diastolic (congestive) heart failure: Secondary | ICD-10-CM | POA: Diagnosis not present

## 2019-09-24 DIAGNOSIS — Z7982 Long term (current) use of aspirin: Secondary | ICD-10-CM | POA: Insufficient documentation

## 2019-09-24 DIAGNOSIS — Z87891 Personal history of nicotine dependence: Secondary | ICD-10-CM | POA: Insufficient documentation

## 2019-09-24 DIAGNOSIS — Z79899 Other long term (current) drug therapy: Secondary | ICD-10-CM | POA: Diagnosis not present

## 2019-09-24 DIAGNOSIS — I130 Hypertensive heart and chronic kidney disease with heart failure and stage 1 through stage 4 chronic kidney disease, or unspecified chronic kidney disease: Secondary | ICD-10-CM | POA: Insufficient documentation

## 2019-09-24 DIAGNOSIS — S299XXA Unspecified injury of thorax, initial encounter: Secondary | ICD-10-CM | POA: Diagnosis present

## 2019-09-24 DIAGNOSIS — S2231XA Fracture of one rib, right side, initial encounter for closed fracture: Secondary | ICD-10-CM | POA: Insufficient documentation

## 2019-09-24 DIAGNOSIS — E1122 Type 2 diabetes mellitus with diabetic chronic kidney disease: Secondary | ICD-10-CM | POA: Insufficient documentation

## 2019-09-24 DIAGNOSIS — Z8673 Personal history of transient ischemic attack (TIA), and cerebral infarction without residual deficits: Secondary | ICD-10-CM | POA: Insufficient documentation

## 2019-09-24 DIAGNOSIS — Y9301 Activity, walking, marching and hiking: Secondary | ICD-10-CM | POA: Diagnosis not present

## 2019-09-24 NOTE — ED Notes (Signed)
Pt positioned in bed, placed on cardiac monitor and warm blankets given.

## 2019-09-24 NOTE — ED Provider Notes (Signed)
[facility] EMERGENCY DEPARTMENT Provider Note   CSN: [id number] Arrival date & time: 09/24/19  1854     History Chief Complaint  Patient presents with  . Chest Pain    Jamion Reagan is a 86 y.o. male present emergency department for mechanical fall 4 days ago.  His daughter says the patient lost his balance while using his [place] and fell onto his right side.  He has been intermittently complaining of pain in his right rib line since his fall.  Seems to be worse when he is coughing.  She try to give him some Tylenol with minimal pain relief.  She is given 20 mg of Motrin and said that he seemed to do much better after that.  There was no head trauma or loss of consciousness after the fall.  He is not on [facility].  HPI     Past Medical History:  Diagnosis Date  . Coronary artery disease   . Esophageal spasm   . Esophageal ulcer    "was hospitalized 2-3 times for bleeding; no problems in the last couple years" (07/19/2015)  . GERD (gastroesophageal reflux disease)    "severe" (07/19/2015)  . Heart murmur   . High cholesterol   . History of stomach ulcers    "right as you go into the stomach" (07/19/2015)  . Hypertension   . Laryngeal dystonia    chronic, appears as it he's dry heaving  . Stroke [facility]) 1991   "gait is a little different since" (07/19/2015)  . Type II diabetes mellitus (HCC)    insulin dependent    Patient Active Problem List   Diagnosis Date Noted  . Bacteremia 02/19/2016  . Delirium 02/16/2016  . UTI (urinary tract infection) 02/16/2016  . Acute encephalopathy 07/19/2015  . Chronic diastolic CHF (congestive heart failure) ([place]) 07/19/2015  . Acute confusion   . Hyponatremia 07/13/2015  . Acute confusional state of metabolic origin [id number]  . Diabetes mellitus with diabetic nephropathy ([place]) 07/13/2015  . Anemia of chronic disease 07/13/2015  . CKD (chronic kidney disease) stage 3, GFR 30-59 ml/min 07/29/2013  . Coronary artery disease     Past  Surgical History:  Procedure Laterality Date  . APPENDECTOMY    . BACK SURGERY    . CATARACT EXTRACTION W/ INTRAOCULAR LENS IMPLANT Right   . ESOPHAGOGASTRODUODENOSCOPY  08/26/2011   Procedure: ESOPHAGOGASTRODUODENOSCOPY (EGD);  Surgeon: Wonda Horner, MD;  Location[facility] ENDOSCOPY;  Service: Endoscopy;  Laterality: N/A;  . ESOPHAGOGASTRODUODENOSCOPY N/A 09/20/2013   Procedure: ESOPHAGOGASTRODUODENOSCOPY (EGD);  Surgeon: Jeryl Columbia, MD;  Location: [facility] ENDOSCOPY;  Service: Endoscopy;  Laterality: N/A;  . LAPAROSCOPIC CHOLECYSTECTOMY    . LARYNX SURGERY     nodule  . LUMBAR DISC SURGERY         Family History  Problem Relation Age of Onset  . Hypertension Mother   . Stroke Mother   . Hypertension Father   . Stroke Father   . Hypertension Sister   . Diabetes type II Sister   . Hypertension Brother   . Cancer Neg Hx   . CAD Neg Hx     Social History   Tobacco Use  . Smoking status: Former Smoker    Packs/day: 0.00    Types: Pipe, Cigars  . Smokeless tobacco: Former Systems developer    Types: Chew  . Tobacco comment: "quit smoking in the 1970s"  Substance Use Topics  . Alcohol use: Yes    Comment: 07/19/2015 "might have a couple drinks  a couple times/year"  . Drug use: No    Home Medications Prior to Admission medications   Medication Sig Start Date End Date Taking? Authorizing Provider  amLODipine (NORVASC) 10 MG tablet Take 10 mg by mouth daily.    [provider]  aspirin EC 81 MG tablet Take 81 mg by mouth daily.    [provider]  carvedilol (COREG) 12.5 MG tablet Take 0.5 tablets (6.25 mg total) by mouth 2 (two) times daily with a meal. 02/21/16   Dhungel, Nishant, MD  docusate sodium (COLACE) 100 MG capsule Take 100 mg by mouth 2 (two) times daily.    [provider]  ferrous sulfate 325 (65 FE) MG tablet Take 325 mg by mouth daily with breakfast.     [provider]  hydrALAZINE (APRESOLINE) 10 MG tablet Take 10 mg by mouth at bedtime.      [provider]  hydrochlorothiazide (HYDRODIURIL) 25 MG tablet Take 25 mg by mouth daily.    [provider]  insulin NPH-insulin regular (NOVOLIN 70/30) (70-30) 100 UNIT/ML injection Inject 10 Units into the skin 2 (two) times daily with a meal.     [provider]  losartan (COZAAR) 100 MG tablet Take 100 mg by mouth daily.    [provider]  Omeprazole-Sodium Bicarbonate (ZEGERID) 20-1100 MG CAPS capsule Take 1 capsule by mouth 2 (two) times daily.     [provider]  senna (SENOKOT) 8.6 MG tablet Take 1 tablet by mouth daily as needed for constipation.    [provider]  sucralfate (CARAFATE) 1 G tablet Take 1 g by mouth 4 (four) times daily.    [provider]  Wheat Dextrin (BENEFIBER PO) Take 10 mLs by mouth every other day. Mix 2 teaspoonsful with 6-8 ounces of juice or fluid and drink once a day     [provider]    Allergies    Patient has no known allergies.  Review of Systems   Review of Systems  Constitutional: Negative for chills and fever.  Respiratory: Negative for cough and shortness of breath.   Cardiovascular: Positive for chest pain. Negative for palpitations.  Gastrointestinal: Negative for abdominal pain and vomiting.  Musculoskeletal: Positive for arthralgias and myalgias. Negative for back pain.  Skin: Negative for color change and rash.  Neurological: Negative for syncope and light-headedness.  Psychiatric/Behavioral: Negative for agitation and confusion.  All other systems reviewed and are negative.   Physical Exam Updated Vital Signs BP 140/72   Pulse 68   Temp 98.3 F (36.8 C) (Oral)   Resp 10   Ht 5\' 11"  (1.803 m)   Wt 81.6 kg   SpO2 98%   BMI 25.10 kg/m   Physical Exam Vitals and nursing note reviewed.  Constitutional:      Appearance: He is well-developed.  HENT:     Head: Normocephalic and atraumatic.  Eyes:     Conjunctiva/sclera: Conjunctivae normal.    Cardiovascular:     Rate and Rhythm: Normal rate and regular rhythm.     Heart sounds: No murmur.  Pulmonary:     Effort: Pulmonary effort is normal. No respiratory distress.  Chest:     Comments: Focal right anterior/lateral rib tenderness Abdominal:     Palpations: Abdomen is soft.     Tenderness: There is no abdominal tenderness.  Musculoskeletal:     Cervical back: Neck supple.  Skin:    General: Skin is warm and dry.  Neurological:  General: No focal deficit present.     Mental Status: He is alert.     ED Results / Procedures / Treatments   Labs (all labs ordered are listed, but only abnormal results are displayed) Labs Reviewed - No data to display  EKG None  Radiology DG Ribs Unilateral W/Chest Right  Result Date: 09/24/2019 CLINICAL DATA:  Post fall with pinpoint right rib pain. 2 falls in the last 4 days. EXAM: RIGHT RIBS AND CHEST - 3+ VIEW COMPARISON:  None. FINDINGS: Nondisplaced right anterior ninth rib fracture versus undulation. No other fracture of the right ribs. There is no evidence of pneumothorax or pleural effusion. Both lungs are clear. Mild cardiomegaly. Aortic atherosclerosis and tortuosity. IMPRESSION: 1. Possible nondisplaced right anterior ninth rib fracture versus undulation. No other fracture of the right ribs. No pneumothorax or pulmonary complication. 2. Mild cardiomegaly. Aortic atherosclerosis and tortuosity. Aortic Atherosclerosis (ICD10-I70.0). Electronically Signed   By: Keith Rake M.D.   On: 09/24/2019 20:37    Procedures Procedures (including critical care time)  Medications Ordered in ED Medications - No data to display  ED Course  I have reviewed the triage vital signs and the nursing notes.  Pertinent labs & imaging results that were available during my care of the patient were reviewed by me and considered in my medical decision making (see chart for details).  86 yo male here with mechanical fall Focal rib pain on  exam, right side Xray personally reviewed - suspect rib fx on 9th rib, correlates with exam, no sign of pneumothorax He is comfortable on exam  Plan for incentive spirometer, d/c with daughter home, low dose nsaids only for break-through pain, lidoderm patch OTC from pharmacy.  Clinical Course as of Sep 23 2308  Sun Sep 24, 2019  2041 IMPRESSION: 1. Possible nondisplaced right anterior ninth rib fracture versus undulation. No other fracture of the right ribs. No pneumothorax or pulmonary complication. 2. Mild cardiomegaly. Aortic atherosclerosis and tortuosity.   [MT]    Clinical Course User Index [MT] Erendida Wrenn, Carola Rhine, MD    Final Clinical Impression(s) / ED Diagnoses Final diagnoses:  Closed fracture of one rib of right side, initial encounter    Rx / DC Orders ED Discharge Orders    None       Wyvonnia Dusky, MD 09/24/19 2310

## 2019-09-24 NOTE — Discharge Instructions (Signed)
You can buy Lidoderm patches (eg Goldbone's) 4% strength, over the counter, at most pharmacies.  This may help with his rib pain.     You can give him 200 mg ibuprofen every 6 hours as needed for pain.  Try to use this sparingly.   You can give him 650 mg of tylenol every 6 hours for pain as needed continuously.  Broken ribs can take 4-6 weeks to heal.   Make sure he continues using his incentive spirometer to prevent a pneumonia from forming in his lungs.

## 2019-09-24 NOTE — ED Triage Notes (Signed)
Fall x 2 over the last 4 days. Denies hitting head, denies loc. Reports pinpoint right rib pain x 4 days.

## 2019-09-24 NOTE — ED Notes (Signed)
Pt to XR

## 2019-10-26 DEATH — deceased
